# Patient Record
Sex: Female | Born: 1940 | Race: White | Hispanic: No | Marital: Married | Smoking: Never smoker
Health system: Southern US, Community
[De-identification: ages and names within clinical notes are randomized; demographics above are authoritative.]

## PROBLEM LIST (undated history)

## (undated) DIAGNOSIS — D259 Leiomyoma of uterus, unspecified: Secondary | ICD-10-CM

## (undated) DIAGNOSIS — I255 Ischemic cardiomyopathy: Secondary | ICD-10-CM

## (undated) DIAGNOSIS — E039 Hypothyroidism, unspecified: Secondary | ICD-10-CM

## (undated) DIAGNOSIS — I2510 Atherosclerotic heart disease of native coronary artery without angina pectoris: Secondary | ICD-10-CM

## (undated) DIAGNOSIS — J309 Allergic rhinitis, unspecified: Secondary | ICD-10-CM

## (undated) DIAGNOSIS — K5730 Diverticulosis of large intestine without perforation or abscess without bleeding: Secondary | ICD-10-CM

## (undated) DIAGNOSIS — I10 Essential (primary) hypertension: Secondary | ICD-10-CM

## (undated) DIAGNOSIS — E785 Hyperlipidemia, unspecified: Secondary | ICD-10-CM

## (undated) HISTORY — DX: Leiomyoma of uterus, unspecified: D25.9

## (undated) HISTORY — PX: TUBAL LIGATION: SHX77

## (undated) HISTORY — DX: Allergic rhinitis, unspecified: J30.9

## (undated) HISTORY — DX: Essential (primary) hypertension: I10

## (undated) HISTORY — DX: Diverticulosis of large intestine without perforation or abscess without bleeding: K57.30

## (undated) HISTORY — PX: CHOLECYSTECTOMY: SHX55

## (undated) HISTORY — PX: ABDOMINAL HYSTERECTOMY: SHX81

## (undated) HISTORY — DX: Hyperlipidemia, unspecified: E78.5

## (undated) HISTORY — DX: Hypothyroidism, unspecified: E03.9

---

## 1997-12-19 ENCOUNTER — Ambulatory Visit (HOSPITAL_COMMUNITY): Admission: RE | Admit: 1997-12-19 | Discharge: 1997-12-19 | Payer: Self-pay | Admitting: Obstetrics and Gynecology

## 1999-02-05 ENCOUNTER — Ambulatory Visit (HOSPITAL_COMMUNITY): Admission: RE | Admit: 1999-02-05 | Discharge: 1999-02-05 | Payer: Self-pay | Admitting: Obstetrics and Gynecology

## 1999-02-05 ENCOUNTER — Encounter: Payer: Self-pay | Admitting: Obstetrics and Gynecology

## 2000-02-07 ENCOUNTER — Encounter: Payer: Self-pay | Admitting: Obstetrics and Gynecology

## 2000-02-07 ENCOUNTER — Ambulatory Visit (HOSPITAL_COMMUNITY): Admission: RE | Admit: 2000-02-07 | Discharge: 2000-02-07 | Payer: Self-pay | Admitting: Obstetrics and Gynecology

## 2001-01-27 ENCOUNTER — Encounter: Payer: Self-pay | Admitting: Obstetrics and Gynecology

## 2001-01-27 ENCOUNTER — Ambulatory Visit (HOSPITAL_COMMUNITY): Admission: RE | Admit: 2001-01-27 | Discharge: 2001-01-27 | Payer: Self-pay | Admitting: Obstetrics and Gynecology

## 2002-02-01 ENCOUNTER — Encounter: Payer: Self-pay | Admitting: Obstetrics and Gynecology

## 2002-02-01 ENCOUNTER — Ambulatory Visit (HOSPITAL_COMMUNITY): Admission: RE | Admit: 2002-02-01 | Discharge: 2002-02-01 | Payer: Self-pay | Admitting: Obstetrics and Gynecology

## 2003-02-03 ENCOUNTER — Ambulatory Visit (HOSPITAL_COMMUNITY): Admission: RE | Admit: 2003-02-03 | Discharge: 2003-02-03 | Payer: Self-pay | Admitting: Obstetrics and Gynecology

## 2003-02-03 ENCOUNTER — Encounter: Payer: Self-pay | Admitting: Obstetrics and Gynecology

## 2004-03-08 ENCOUNTER — Ambulatory Visit (HOSPITAL_COMMUNITY): Admission: RE | Admit: 2004-03-08 | Discharge: 2004-03-08 | Payer: Self-pay | Admitting: Obstetrics and Gynecology

## 2004-03-12 ENCOUNTER — Encounter: Admission: RE | Admit: 2004-03-12 | Discharge: 2004-03-12 | Payer: Self-pay | Admitting: Obstetrics and Gynecology

## 2004-06-25 ENCOUNTER — Ambulatory Visit: Payer: Self-pay | Admitting: Internal Medicine

## 2004-10-17 ENCOUNTER — Ambulatory Visit: Payer: Self-pay | Admitting: Internal Medicine

## 2004-10-30 ENCOUNTER — Ambulatory Visit: Payer: Self-pay | Admitting: Internal Medicine

## 2004-11-20 ENCOUNTER — Encounter (HOSPITAL_COMMUNITY): Admission: RE | Admit: 2004-11-20 | Discharge: 2005-02-18 | Payer: Self-pay | Admitting: Internal Medicine

## 2005-04-04 ENCOUNTER — Ambulatory Visit (HOSPITAL_COMMUNITY): Admission: RE | Admit: 2005-04-04 | Discharge: 2005-04-04 | Payer: Self-pay | Admitting: Obstetrics and Gynecology

## 2005-07-18 ENCOUNTER — Ambulatory Visit: Payer: Self-pay | Admitting: Family Medicine

## 2005-09-15 ENCOUNTER — Ambulatory Visit: Payer: Self-pay | Admitting: Family Medicine

## 2005-09-24 ENCOUNTER — Ambulatory Visit: Payer: Self-pay | Admitting: Gastroenterology

## 2005-10-08 ENCOUNTER — Ambulatory Visit: Payer: Self-pay | Admitting: Gastroenterology

## 2006-04-07 ENCOUNTER — Ambulatory Visit (HOSPITAL_COMMUNITY): Admission: RE | Admit: 2006-04-07 | Discharge: 2006-04-07 | Payer: Self-pay | Admitting: Obstetrics and Gynecology

## 2006-07-10 ENCOUNTER — Ambulatory Visit: Payer: Self-pay | Admitting: Internal Medicine

## 2006-12-28 ENCOUNTER — Ambulatory Visit: Payer: Self-pay | Admitting: Internal Medicine

## 2007-02-26 ENCOUNTER — Ambulatory Visit: Payer: Self-pay | Admitting: Internal Medicine

## 2007-02-26 LAB — CONVERTED CEMR LAB
ALT: 52 units/L — ABNORMAL HIGH (ref 0–35)
AST: 42 units/L — ABNORMAL HIGH (ref 0–37)
Albumin: 4 g/dL (ref 3.5–5.2)
Amylase: 68 units/L (ref 27–131)
Bilirubin, Direct: 0.1 mg/dL (ref 0.0–0.3)
Total Bilirubin: 0.9 mg/dL (ref 0.3–1.2)

## 2007-03-15 ENCOUNTER — Ambulatory Visit: Payer: Self-pay | Admitting: Internal Medicine

## 2007-03-15 ENCOUNTER — Inpatient Hospital Stay (HOSPITAL_COMMUNITY): Admission: EM | Admit: 2007-03-15 | Discharge: 2007-03-20 | Payer: Self-pay | Admitting: Emergency Medicine

## 2007-03-19 ENCOUNTER — Encounter (INDEPENDENT_AMBULATORY_CARE_PROVIDER_SITE_OTHER): Payer: Self-pay | Admitting: General Surgery

## 2007-03-26 ENCOUNTER — Ambulatory Visit: Payer: Self-pay | Admitting: Gastroenterology

## 2007-07-02 ENCOUNTER — Ambulatory Visit (HOSPITAL_COMMUNITY): Admission: RE | Admit: 2007-07-02 | Discharge: 2007-07-02 | Payer: Self-pay | Admitting: Obstetrics and Gynecology

## 2007-07-14 ENCOUNTER — Ambulatory Visit: Payer: Self-pay | Admitting: Family Medicine

## 2007-08-02 ENCOUNTER — Encounter: Payer: Self-pay | Admitting: Internal Medicine

## 2007-08-02 DIAGNOSIS — E785 Hyperlipidemia, unspecified: Secondary | ICD-10-CM | POA: Insufficient documentation

## 2007-08-02 DIAGNOSIS — I10 Essential (primary) hypertension: Secondary | ICD-10-CM | POA: Insufficient documentation

## 2007-08-02 DIAGNOSIS — D259 Leiomyoma of uterus, unspecified: Secondary | ICD-10-CM | POA: Insufficient documentation

## 2007-08-02 DIAGNOSIS — E039 Hypothyroidism, unspecified: Secondary | ICD-10-CM | POA: Insufficient documentation

## 2007-08-02 DIAGNOSIS — J310 Chronic rhinitis: Secondary | ICD-10-CM | POA: Insufficient documentation

## 2007-08-02 DIAGNOSIS — K5730 Diverticulosis of large intestine without perforation or abscess without bleeding: Secondary | ICD-10-CM | POA: Insufficient documentation

## 2007-08-03 ENCOUNTER — Ambulatory Visit: Payer: Self-pay | Admitting: Internal Medicine

## 2007-08-03 DIAGNOSIS — D485 Neoplasm of uncertain behavior of skin: Secondary | ICD-10-CM | POA: Insufficient documentation

## 2007-08-18 ENCOUNTER — Ambulatory Visit: Payer: Self-pay | Admitting: Internal Medicine

## 2007-11-01 ENCOUNTER — Ambulatory Visit: Payer: Self-pay | Admitting: Internal Medicine

## 2008-03-15 LAB — HM COLONOSCOPY: HM Colonoscopy: NORMAL

## 2008-07-25 ENCOUNTER — Ambulatory Visit (HOSPITAL_COMMUNITY): Admission: RE | Admit: 2008-07-25 | Discharge: 2008-07-25 | Payer: Self-pay | Admitting: Obstetrics and Gynecology

## 2008-09-14 ENCOUNTER — Encounter: Payer: Self-pay | Admitting: Internal Medicine

## 2008-09-22 ENCOUNTER — Encounter: Payer: Self-pay | Admitting: Internal Medicine

## 2008-12-28 ENCOUNTER — Encounter: Payer: Self-pay | Admitting: Internal Medicine

## 2009-02-26 ENCOUNTER — Ambulatory Visit: Payer: Self-pay | Admitting: Internal Medicine

## 2009-05-15 ENCOUNTER — Ambulatory Visit: Payer: Self-pay | Admitting: Family Medicine

## 2009-07-30 ENCOUNTER — Ambulatory Visit (HOSPITAL_COMMUNITY): Admission: RE | Admit: 2009-07-30 | Discharge: 2009-07-30 | Payer: Self-pay | Admitting: Obstetrics and Gynecology

## 2010-06-13 ENCOUNTER — Ambulatory Visit: Payer: Self-pay | Admitting: Internal Medicine

## 2010-07-31 ENCOUNTER — Ambulatory Visit (HOSPITAL_COMMUNITY)
Admission: RE | Admit: 2010-07-31 | Discharge: 2010-07-31 | Payer: Self-pay | Source: Home / Self Care | Attending: Obstetrics and Gynecology | Admitting: Obstetrics and Gynecology

## 2010-08-04 ENCOUNTER — Encounter: Payer: Self-pay | Admitting: Internal Medicine

## 2010-08-13 NOTE — Assessment & Plan Note (Signed)
Summary: [facility] FLU SHOT/[facility]  Nurse Visit   Allergies: 1)  ! Benadryl 2)  ! * Cold Medications  Orders Added: 1)  Flu Vaccine 79yrs + MEDICARE PATIENTS [Q2039] 2)  Administration Flu vaccine - MCR [G0008]   Flu Vaccine Consent Questions     Do you have a history of severe allergic reactions to this vaccine? no    Any prior history of allergic reactions to egg and/or gelatin? no    Do you have a sensitivity to the preservative Thimersol? no    Do you have a past history of Guillan-Barre Syndrome? no    Do you currently have an acute febrile illness? no    Have you ever had a severe reaction to latex? no    Vaccine information given and explained to patient? yes    Are you currently pregnant? no    Lot Number:[id number]   Exp Date:01/11/2011   Site Given  Left Deltoid IM

## 2010-11-26 NOTE — H&P (Signed)
Veronica Barron, Veronica Barron                ACCOUNT NO.:  [id number]   MEDICAL RECORD NO.:  [id number]          PATIENT TYPE:  EMS   LOCATION:  MAJO                         FACILITY:  [facility]   PHYSICIAN:  Hollice Espy, M.D.DATE OF BIRTH:  Feb 22, 1941   DATE OF PROCEDURE:  DATE OF DISCHARGE:                    STAT - MUST CHANGE TO CORRECT WORK TYPE   CHIEF COMPLAINT:  Abdominal pain with nausea and vomiting.   HISTORY OF PRESENT ILLNESS:  The patient is a 70 year old white female  with a past medical history of hypertension and hypothyroidism who  started having abdominal pain several weeks ago.  The pain was described  as mid epigastric, nonradiating with some mild nausea.  She followed up  with her primary care physician who ran some blood work and told the  patient that he suspected this was reflux and did not think that this  was her gallbladder or pancreas.  Patient was started on a PPI.  Since  that time she says that it has helped some of her symptoms but she  continued to have some mid epigastric discomfort even since then.  Today  however, after breakfast she started having severe mid epigastric pain  with forceful nausea and vomiting with no relief.  She came into the  emergency room for further evaluation.  In the emergency room patient  had labs drawn.  She was found to have an elevated transaminase which  was AST of 278 and ALT of 330 and alk phos of 248.  Her total bilirubin  was 2.8.  Other labs of note were a lipase of 3700 and a white count of  11.5 with an 86% shift.  The patient's blood sugar was also elevated at  161.  It was highly suspicious that patient had a gallstone  pancreatitis.  She underwent an ultrasound of the abdomen which the  results are still pending.  She received the medication for her pain and  nausea as well as currently feeling better.  She complains now of some  mild nausea and some mild mid epigastric pain but otherwise is doing  better.  She  denies any headache, vision changes, dysphagia, chest pain,  palpitations, shortness of breath, wheezing, coughing, no hematuria or  dysuria, constipation or diarrhea, focal __________ , numbness, weakness  or pain.  Review of systems is otherwise negative.   PAST MEDICAL HISTORY:  1. Hypothyroidism.  2. Hypertension.   MEDICATIONS:  1. HCTZ 12.5 daily.  2. Synthroid 50 mcg p.o. daily.   ALLERGIES:  No known drug allergies.   SOCIAL HISTORY:  She denies any tobacco or drug use or heavy alcohol  use.   FAMILY HISTORY:  Noncontributory.   __________  Patient also had a previous history of hysterectomy but still has her  appendix.   PHYSICAL EXAMINATION:  VITAL SIGNS:  Temperature 97, pulse 87, blood  pressure 158/94, respirations 16, O2 sats 98% on room air.  GENERAL:  She is alert and oriented x 3, currently in no distress.  HEENT:  Head is normocephalic, atraumatic.  Her mucous membranes are  slightly dry.  She has no carotid bruit.  HEART:  Regular rate and rhythm, S1, S2.  LUNGS:  Clear to auscultation bilaterally.  ABDOMEN:  Soft with some tenderness in the mid epigastric area but no  radiation and is nondistended.  Positive bowel sounds.  EXTREMITIES:  No clubbing, cyanosis or edema.   LABORATORY DATA:  UA notes moderate bilirubin, trace leukocyte esterase  with 0-2 white cells and rare bacteria.  Her LFTs are noted for AST,  278, ALT 330, alk phos 248, total bilirubin 2.8 with a direct of 1.3.  Lipase is 3726, white count 11.5, H&H 14.8 and 43, MCV 80, platelet  count 452, 86% neutrophils.  Sodium 135, potassium  3.4, chloride 104,  bicarb 25, BUN 10, creatinine 0.8, glucose 161.   Ultrasound of the gallbladder is pending.   ASSESSMENT AND PLAN:  1. Gallstone pancreatitis.  Await ultrasound results.  Will make the      patient n.p.o., IV Protonix, pain and nausea control.  Will repeat      LFTs and lipase in the morning.  If numbers have not improved we      will  discuss with __________ , GI who has already been called by      the emergency room for possible ERCP.  2. Leukocytosis, likely secondary to #1.  Continue to follow.  3. Hypothyroidism.  Will hold her Synthroid which has a long half life      and she can tolerate several days of that.  4. Hypertension.  Hold her HCTZ.  P.r.n. IV medications if needed.      Hollice Espy, M.D.  Electronically Signed     SKK/MEDQ  D:  03/15/2007  T:  03/15/2007  Job:  [id number]   cc:   Rosalyn Gess. Norins, MD

## 2010-11-26 NOTE — Consult Note (Signed)
NAMEHUGH, GARROW NO.:  [id number]   MEDICAL RECORD NO.:  [id number]          PATIENT TYPE:  INP   LOCATION:  5735                         FACILITY:  [facility]   PHYSICIAN:  Cherylynn Ridges, M.D.    DATE OF BIRTH:  [birth year]   DATE OF CONSULTATION:  03/17/2007  DATE OF DISCHARGE:                                 CONSULTATION   REASON FOR CONSULTATION:  Biliary pancreatitis.   HISTORY OF PRESENT ILLNESS:  Veronica Barron is a 65-year female patient with  a history of an apparent gallbladder attack 12 years ago.  She was  treated successfully with Actigall without any recurrence of symptoms.  She was admitted to the hospital on March 15, 2007 by the hospitalist  with complaints of abdominal pain and nausea.  This had been occurring  for several weeks with an increased severity in frequency of symptoms.  She was found to have transaminitis with pancreatitis on her initial lab  work.  Lipase was greater than 3700.  Her white count was 11,500.  Ultrasound revealed multiple gallstones and chronic cholecystitis  without evidence of ductal dilatation or obstruction.  GI services were  consulted after admission and they recommended surgical consultation for  evaluation for cholecystectomy, noting that today her lipase is nearly  normal at 139, AST is down to 86, ALT down to 71 and white count has  normalized to 9900.   REVIEW OF SYSTEMS:  The patient is still complaining of epigastric and  right upper quadrant pain mainly with activity and palpation.  She has  some mild nausea but that has actually improved over the day.   SOCIAL HISTORY:  No alcohol.  No tobacco.  She does not work outside the  home.   PAST MEDICAL HISTORY:  1. Hypothyroidism.  2. Hypertension.   PAST SURGICAL HISTORY:  1. Hysterectomy.  2. Laparoscopic tubal ligation.   ALLERGIES:  NKDA.   HOME MEDICATIONS:  1. Hydrochlorothiazide.  2. Synthroid.  3. She is on Lovenox here for DVT  prophylaxis.   PHYSICAL EXAM:  GENERAL:  Pleasant female patient currently denying any  significant complaints except for some mild epigastric and right upper  quadrant pain.  VITAL SIGNS:  Temperature 98.6, blood pressure 132/70, pulse 74 and  regular, respirations 16.  NEURO:  The patient is alert and oriented x3 moving all extremities x4.  No focal deficits.  HEENT:  Head normocephalic.  Sclera noninjected, nonicteric.  NECK:  Supple.  No adenopathy.  CHEST:  Bilateral lung sounds clear to auscultation.  Respiratory effort  is nonlabored.  She is on room air.  CARDIAC:  S1-S2, no rubs, murmurs or gallops.  Pulses regular.  No JVD.  She has IV fluids infusing.  ABDOMEN:  Abdomen is soft, slightly obese, tender in the epigastrium and  right upper quadrant.  No guarding or rebounding.  Bowel sounds are  present.  EXTREMITIES:  Symmetrical in appearance without edema, cyanosis or  clubbing.   LABS:  Sodium 139, potassium 3.7, CO2 27, glucose 129, BUN 8, creatinine  0.64, white count 9900, hemoglobin 12.5, platelets  383,000.  AST peak  was 278, now 86, ALT peak 313, now 171.  Alkaline phosphatase peak was  248, now 175.  Total bilirubin peak was 2.8, now 1.5.  Lipase peak was  3726, now 139.   DIAGNOSTICS:  Ultrasound of the abdomen as noted in the history present  illness.   IMPRESSION:  1. Biliary pancreatitis.  2. Chronic cholecystitis with associated cholelithiasis.  3. Hypothyroidism.  4. Hypertension.   PLAN:  The patient's pancreatic enzymes as well as LFTs are trending  down towards normal.  At this point will probably proceed with  laparoscopic cholecystectomy this Friday, sooner if the surgeon and OR  suite become available.  Will order clear liquids, advance to low fat  full liquids if tolerates clears.  Additional recommendations per Dr.  Lindie Spruce.      Veronica Barron, N.P.      Cherylynn Ridges, M.D.  Electronically Signed    ALE/MEDQ  D:  03/17/2007  T:   03/17/2007  Job:  [id number]   cc:   Rosalyn Gess. Norins, MD

## 2010-11-26 NOTE — Assessment & Plan Note (Signed)
Veronica Barron [facility]                           PRIMARY CARE OFFICE NOTE   Veronica Barron, Veronica Barron                       MRN:          [id number]  DATE:12/28/2006                            DOB:          [birth year]    Veronica Barron is a 70 year old woman I usually saw at [facility]. Her  last office visit with me was in May of 2006. She subsequently to that  has seen Dr. Hetty Ely on several occasions. She is also followed by Dr.  Kyra Manges.   Patient presents today complaining of a swollen nodule at the later  aspect of her left foot. She reports this has been present for several  months. It is becoming more uncomfortable and painful for her to wear  regular shoes. She denies an injury or trauma.   Lipids, patient has had a longstanding history of hyperlipidemia with a  cholesterol in the 300+ range and LDL in 200+ range. She has been tried  on multiple statin drugs of which she is intolerant, including; Lipitor,  Crestor, Pravachol. She has tried niacin in the past but found the hot  flashes and severe headaches were intolerable. She has tried Zetia in  the past but had difficultly with myalgias and insomnia.   Hypertension, patient reports she has white coat hypertension with  elevated blood pressures at doctors office. Her blood pressures at home  usually run in the 140/70s range.   Patient is followed for hypothyroid disease by Dr. Kyra Manges, who has  her on Synthroid replacement therapy.   Health maintenance, patient reports she saw Dr. Elana Alm last March of  2008. Last mammogram was July of 2007. Last colonoscopy was March of  2007 with a normal study except for diverticulosis.   PAST MEDICAL HISTORY:   SURGICAL:  TAH secondary to fibroids with tubal ligation. No other  surgeries reported.   MEDICAL:  Patient had usual childhood diseases. She has hypertension,  hyperlipidemia, hypothyroid disease.   GYNECOLOGICAL HISTORY:  Patient is gravida  3, para 3.   HABITS:  Tobacco none, alcohol social.   FAMILY HISTORY:  Father died at age 6 of myocardial infarction. He also  had rheumatoid arthritis. Mother had kidney trouble. Family history is  positive for breast cancer in a maternal aunt. Family history negative  for colonoscopy or diabetes.   SOCIAL HISTORY:  Patient is married for 50 years. She has 3 sons, 2  grandchildren.   REVIEW OF SYSTEMS:  Patient has had no fevers, sweats, chills, or other  constitutional symptoms. No Ophthalmology complaints or problems. No  ENT, cardiovascular, respiratory problems. She has occasional heartburn  for which takes over-the-counter medications. She complains of urinary  frequency and urgency. She has tried prescription anticholinergics,  which her intolerable secondary to dryness. Patient does have some mild  osteoarthritic discomfort in her hands. No neurologic or dermatologic  complaints or problems.   Limited exam, temperature was 97.4, blood pressure 189/100, pulse was  88, weight 155.  GENERAL APPEARANCE: Well-nourished, well-groomed Caucasian woman who  looks her stated age in no acute distress.  EXTREMITIES: Patient  has 1 cm nodule at the lateral aspect of her foot  at the mid foot region which is tender. No fluctuance is noted. It is  not warm to the touch.   ASSESSMENT/PLAN:  1. Foot pain, patient with a possible bone cyst. Plan, plain foot      films. If there is no bone cyst then  I would have her return for      attempted aspiration and then follow up injection with cortisone.  2. Lipids, patient with significant hyperlipidemia by history. In      reviewing her chart she has never tried gemfibrozil. Plan,      gemfibrozil 600 mg tablets, one half tablet daily q. 3, and then 1      whole tablet daily times 3, and then 1 tablet in the morning and      one half tablet in the evening q.3., and then 1 tablet b.i.d.      Patient will need to have follow up laboratory in 6 to  8 weeks.  3. Hypertension, patient's blood pressure is very high in the office      but by her report running well at home. She is a reliable patient,      I would ask her to bring in readings from her blood pressure      machine at home, as well as possibly bringing her blood pressure      machine for checking for accuracy.  4. Hypothyroid disease, patient reports she is monitored by Dr.      Elana Alm.  5. Health maintenance, patient is current and up to date with her      gynecologist and for colorectal cancer screening.   Patient will be notified by phone with the results of her x-ray and  whether she needs to return for aspiration and injection. She will  return in 6 to 8 weeks as noted for checking on her lipids.     Veronica Gess Norins, MD  Electronically Signed    MEN/MedQ  DD: 12/28/2006  DT: 12/29/2006  Job #: [id number]   cc:   Veronica Barron

## 2010-11-26 NOTE — Op Note (Signed)
Veronica Barron, LUKIC NO.:  [id number]   MEDICAL RECORD NO.:  [id number]          PATIENT TYPE:  INP   LOCATION:  5735                         FACILITY:  [facility]   PHYSICIAN:  Cherylynn Ridges, M.D.    DATE OF BIRTH:  Nov 29, 1940   DATE OF PROCEDURE:  03/19/2007  DATE OF DISCHARGE:                               OPERATIVE REPORT   PREOPERATIVE DIAGNOSES:  Gallstone pancreatitis.   POSTOPERATIVE DIAGNOSES:  Gallstone pancreatitis.   PROCEDURE:  Laparoscopic cholecystectomy with cholangiogram.   SURGEON:  Dr. Lindie Spruce.   ANESTHESIA:  General endotracheal.   ESTIMATED BLOOD LOSS:  Less than 20 mL.   COMPLICATIONS:  None.   CONDITION:  Stable.   FINDINGS:  The patient had a very large greater than 1 cm cystic duct.  There were impacted stones in the cystic duct which we will milk back  through cholecystodochotomy. The cholangiogram showed good flow into the  duodenum, no intraductal filling defects, good proximal filling and good  flow into the duodenum.  There was evidence of acute inflammation.   INDICATIONS FOR OPERATION:  The patient is a 70 year old who was  admitted with gallstone pancreatitis who now has come to the operating  room for a laparoscopic cholecystectomy.   DESCRIPTION OF PROCEDURE:  The patient was taken to the operating room,  placed on the table in the supine position.  After an adequate general  endotracheal anesthetic was administered, she was prepped and draped in  the usual sterile manner exposing the midline in her right upper  quadrant.   A supraumbilical curvilinear incision was made using a #11 blade and  taken down to the midline fascia.  Once the midline fascia was  identified and exposed using [employer] retractors, a longitudinal  incision was made in the fascia using a #15 blade.  We grabbed the edges  of the fascia using Kocher clamps and lifted up and bluntly dissected  down into the peritoneal cavity using a Kelly clamp.  Once  this was  done, a pursestring suture of #0 Vicryl was passed around the fascial  opening to secure in a Hasson cannula which was subsequently passed.  With Hasson cannula in place, carbon dioxide gas was insufflated into  the peritoneal cavity up to maximal intra-abdominal pressure of 15 mmHg.   Once this was done, the patient was placed in reverse Trendelenburg, the  left side was tilted down, two right costal margin, a right-sided 5-mm  cannulas and a subxiphoid 12 mm cannula passed under direct vision. The  gallbladder was found to be very narrow and thin and somewhat  intrahepatic.  We retracted it towards the right upper quadrant and the  anterior abdominal wall using a retractor through the lateral-most 5-mm  cannula.  A second grasper was passed along the infundibulum.  We  dissected out the peritoneum overlying the triangle of Calot and  hepatoduodenal triangle.  When this was done, we were able to expose  what appeared to be a very large cystic duct.  We took care to identify  a window around this ductal structure and  also identify in the triangle  of Calot the cystic artery which we subsequently clipped and transected.  We were able to clearly identify that this large ductal structure was  the cystic duct.  It was too large to pass a clip along the gallbladder  side so we made a cholecystodochotomy through which multiple impacted  stones were milked free.  We were able to pass a right-angle clamp into  the duct opening and retrieve further stone structures.  We could not  place a clip along the distal side but we did place a Cook catheter  which had been passed through the anterior abdominal wall.  We secured  it in place with the Endoclip.  Once this was done, a cholangiogram was  done which demonstrated the findings noted previously.   Once the cholangiogram was completed, we removed the clip securing it in  place, removed the catheter itself and then we completely transected  the  cystic duct.  Once that was done, we used an alligator clamp to control  the end of the transected cystic duct point. We used two Endoloops to  secure the open duct and close it off.   Once this was done and no sutures were cut, we dissected out the  gallbladder from its bed with minimal difficulty.  Then we brought it  out using an EndoCatch bag from the supraumbilical site with minimal  difficulty.   We irrigated with about a liter and half of warm saline solution and  there was excellent hemostasis of the hepatic bed.  We aspirated all  fluid and gas from above the liver.  We removed the cannula from the  supraumbilical site and close off the fascia using a pursestring suture  which was in place.   Once this was completed, we removed all cannulas, aspirated all fluid  and gas and then closed.   The lateral cannula sites were closed with Dermabond.  The  supraumbilical and subxiphoid skin sites were closed using a running  subcuticular stitch of 4-0 Vicryl.  A 0.25% Marcaine with epi was  injected at all sites.  All needle counts, sponge counts and instrument  counts were correct at the conclusion of the case. Sterile dressings  were applied to all wounds.      Cherylynn Ridges, M.D.  Electronically Signed     JOW/MEDQ  D:  03/19/2007  T:  03/19/2007  Job:  [id number]   cc:   Vikki Ports A. Felicity Coyer, MD

## 2010-11-29 NOTE — Discharge Summary (Signed)
NAMEPHILOMENA, Veronica Barron                ACCOUNT NO.:  [id number]   MEDICAL RECORD NO.:  [id number]          PATIENT TYPE:  INP   LOCATION:  5735                         FACILITY:  [facility]   PHYSICIAN:  Valerie A. Felicity Coyer, MDDATE OF BIRTH:  [birth year]   DATE OF ADMISSION:  03/15/2007  DATE OF DISCHARGE:  03/20/2007                               DISCHARGE SUMMARY   This is a discharge summary the patient 53 medical record number zero,  zero.  [id number].   DISCHARGE DIAGNOSES:  1. Acute biliary pancreatitis status post laparoscopic cholecystectomy      with intraoperative cholangiogram performed on March 19, 2007.  2. Hypothyroid.  3. Hypertension.  4. Hypokalemia.   HISTORY OF PRESENT ILLNESS:  Veronica Barron is a 70 year old female who was  admitted on March 15, 2007 with chief complaint of abdominal pain and  nausea and vomiting.  She noted that her abdominal pain started several  weeks prior to her presenting in the emergency department.  She  described her pain as midepigastric and non-radiating with some mild  nausea.  She was admitted for further evaluation and treatment.   PAST MEDICAL HISTORY:  1. Hypothyroidism.  2. Hypertension.   COURSE OF HOSPITALIZATION:  1. Acute biliary pancreatitis.  The patient was admitted and was seen      in consultation by [facility].  Ultrasound noted on a contracted      gallbladder suggesting chronic cholecystitis.  They recommended a      surgical evaluation.  The patient was seen in consultation by Dr.      Lindie Spruce of surgery and was taking was taken to the operating room on      March 19, 2007 for laparoscopic cholecystectomy with      intraoperative cholangiogram which was performed by Dr. Lindie Spruce.      Postoperative course was uneventful.  The patient was discharged to      home on March 20, 2007   MEDICATIONS:  At time of discharge:  1. Hydrochlorothiazide 12.5 mg p.o. daily.  2. Synthroid 50 mcg p.o. daily.  3. Tylox one tab p.o.  q.4h. p.r.n. pain.   DISPOSITION:  The patient was discharged to home.  this is.      Sandford Craze, NP      Raenette Rover. Felicity Coyer, MD  Electronically Signed    MO/MEDQ  D:  05/06/2007  T:  05/07/2007  Job:  [id number]

## 2011-04-25 LAB — CBC
HCT: 37.4
HCT: 38.2
HCT: 38.5
Hemoglobin: 12.5
Hemoglobin: 12.8
Hemoglobin: 13.1
Hemoglobin: 13.3
Hemoglobin: 14.8
MCHC: 34.2
MCHC: 34.2
MCHC: 34.4
MCHC: 34.6
MCV: 87.4
MCV: 87.6
MCV: 87.9
Platelets: 388
Platelets: 452 — ABNORMAL HIGH
RBC: 4.28
RBC: 4.39
RDW: 14.7 — ABNORMAL HIGH
RDW: 14.9 — ABNORMAL HIGH
WBC: 11.5 — ABNORMAL HIGH
WBC: 8.7
WBC: 9.9

## 2011-04-25 LAB — URINALYSIS, ROUTINE W REFLEX MICROSCOPIC
Glucose, UA: NEGATIVE
Specific Gravity, Urine: 1.019
Urobilinogen, UA: 1
pH: 6.5

## 2011-04-25 LAB — LIPASE, BLOOD
Lipase: 139 — ABNORMAL HIGH
Lipase: 3726 — ABNORMAL HIGH
Lipase: 53
Lipase: 83 — ABNORMAL HIGH

## 2011-04-25 LAB — COMPREHENSIVE METABOLIC PANEL
AST: 149 — ABNORMAL HIGH
Alkaline Phosphatase: 175 — ABNORMAL HIGH
BUN: 8
CO2: 24
Calcium: 8.9
Creatinine, Ser: 0.48
Creatinine, Ser: 0.64
GFR calc non Af Amer: >60
GFR calc non Af Amer: >60
Glucose, Bld: 117 — ABNORMAL HIGH
Glucose, Bld: 126 — ABNORMAL HIGH
Potassium: 3 — ABNORMAL LOW
Potassium: 3.7
Total Bilirubin: 1.9 — ABNORMAL HIGH
Total Protein: 6.4
Total Protein: 6.6

## 2011-04-25 LAB — DIFFERENTIAL
Basophils Absolute: 0
Basophils Absolute: 0.1
Eosinophils Absolute: 0
Eosinophils Absolute: 0.1
Lymphocytes Relative: 12
Lymphocytes Relative: 25
Lymphs Abs: 1.4
Lymphs Abs: 2.7
Neutro Abs: 9.9 — ABNORMAL HIGH
Neutrophils Relative %: 70

## 2011-04-25 LAB — BASIC METABOLIC PANEL
GFR calc non Af Amer: >60
Glucose, Bld: 133 — ABNORMAL HIGH
Potassium: 3.1 — ABNORMAL LOW
Sodium: 138

## 2011-04-25 LAB — I-STAT 8, (EC8 V) (CONVERTED LAB)
Acid-Base Excess: 2
BUN: 10
Bicarbonate: 24.9 — ABNORMAL HIGH
HCT: 53 — ABNORMAL HIGH
Hemoglobin: 18 — ABNORMAL HIGH
Operator id: 270111
Sodium: 135

## 2011-04-25 LAB — HEPATIC FUNCTION PANEL
ALT: 129 — ABNORMAL HIGH
ALT: 330 — ABNORMAL HIGH
Bilirubin, Direct: 1.3 — ABNORMAL HIGH
Indirect Bilirubin: 0.9
Indirect Bilirubin: 1.5 — ABNORMAL HIGH
Total Protein: 6.5
Total Protein: 7.7

## 2011-07-28 ENCOUNTER — Other Ambulatory Visit: Payer: Self-pay | Admitting: Internal Medicine

## 2011-07-28 DIAGNOSIS — Z1231 Encounter for screening mammogram for malignant neoplasm of breast: Secondary | ICD-10-CM

## 2011-08-27 ENCOUNTER — Ambulatory Visit (HOSPITAL_COMMUNITY)
Admission: RE | Admit: 2011-08-27 | Discharge: 2011-08-27 | Disposition: A | Discharge status: Home / Self Care | Payer: Medicare Other | Source: Ambulatory Visit | Attending: Internal Medicine | Admitting: Internal Medicine

## 2011-08-27 DIAGNOSIS — Z1231 Encounter for screening mammogram for malignant neoplasm of breast: Secondary | ICD-10-CM | POA: Insufficient documentation

## 2011-09-11 ENCOUNTER — Ambulatory Visit (INDEPENDENT_AMBULATORY_CARE_PROVIDER_SITE_OTHER): Payer: Medicare Other | Admitting: Internal Medicine

## 2011-09-11 VITALS — BP 168/92 | HR 79 | Temp 98.6°F | Resp 22 | Ht 63.0 in | Wt 159.5 lb

## 2011-09-11 DIAGNOSIS — E785 Hyperlipidemia, unspecified: Secondary | ICD-10-CM

## 2011-09-11 DIAGNOSIS — Z0000 Encounter for general adult medical examination without abnormal findings: Secondary | ICD-10-CM

## 2011-09-11 DIAGNOSIS — I10 Essential (primary) hypertension: Secondary | ICD-10-CM

## 2011-09-11 DIAGNOSIS — R202 Paresthesia of skin: Secondary | ICD-10-CM

## 2011-09-11 DIAGNOSIS — E039 Hypothyroidism, unspecified: Secondary | ICD-10-CM

## 2011-09-11 MED ORDER — LOSARTAN POTASSIUM-HCTZ 50-12.5 MG PO TABS: 1.0000 | ORAL_TABLET | Freq: Every day | ORAL | Status: DC

## 2011-09-11 MED ORDER — SERTRALINE HCL 100 MG PO TABS: 50.0000 mg | ORAL_TABLET | Freq: Every day | ORAL | Status: DC

## 2011-09-11 NOTE — Patient Instructions (Signed)
Thanks for coming to see me.   Blood pressure - will start a new product: losartan 50 mg ( an ARB) with HCTZ 12.5 ( a diuretic). Please come back in one month for follow-up lab to monitor kidney function and for a blood pressure check.  Cholesterol - will check a level to see what can of problem we have. There are NON-STATIN drugs we can use.  Bone density - will have that scheduled for you  Paresthesia - tingling in the feet - will check a B12 level along with thyroid today.

## 2011-09-11 NOTE — Progress Notes (Signed)
Subjective:    Patient ID: Veronica Barron, female    DOB: 03/09/1941, 71 y.o.   MRN: [id number]  HPI The patient is here for annual Medicare wellness examination and management of other chronic and acute problems.   The risk factors are reflected in the social history.  The roster of all physicians providing medical care to patient - is listed in the Snapshot section of the chart.  Activities of daily living:  The patient is 100% inedpendent in all ADLs: dressing, toileting, feeding as well as independent mobility  Home safety : The patient has outdated smoke detectors in the home. Falls - discussed "fall-safety. They wear seatbelts.  firearms are present in the home, kept in a safe fashion. There is no violence in the home.   There is no risks for hepatitis, STDs or HIV. There is no history of blood transfusion. They have no travel history to infectious disease endemic areas of the world.  The patient has seen their dentist in the last six month. 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 They deny any hearing difficulty and have not had audiologic testing in the last year.  They do not  have excessive sun exposure. Discussed the need for sun protection: hats, long sleeves and use of sunscreen if there is significant sun exposure.   Diet: the importance of a healthy diet is discussed. They do have a healthy diet.  The patient has a regular exercise program: walks ,  45 min duration, 5 per week.  The benefits of regular aerobic exercise were discussed.  Depression screen: there are no signs or vegative symptoms of depression- irritability, change in appetite, anhedonia, sadness/tearfullness. Has been able to work through her grief over the loss of  Her son last year in an MVA  Cognitive assessment: the patient manages all their financial and personal affairs and is actively engaged.   The following portions of the patient's history were  reviewed and updated as appropriate: allergies, current medications, past family history, past medical history,  past surgical history, past social history  and problem list.  Vision, hearing, body mass index were assessed and reviewed.   During the course of the visit the patient was educated and counseled about appropriate screening and preventive services including : fall prevention , diabetes screening, nutrition counseling, colorectal cancer screening, and recommended immunizations.    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  HEENT:  Negative for hearing loss, ear pain, congestion, neck stiffness and postnasal drip. Negative for sore throat or swallowing problems. Negative for dental complaints.   Eyes: Negative for vision loss or change in visual acuity.  Respiratory: Negative for chest tightness and wheezing. Negative for DOE.   Cardiovascular: Negative for chest pain or palpitations.  decreased exercise tolerance Gastrointestinal: No change in bowel habit. No bloating or gas. No reflux or indigestion Genitourinary: Negative for urgency, frequency, flank pain and difficulty urinating.  Musculoskeletal: Negative for myalgias, back pain, arthralgias and gait problem.  Neurological: Negative for dizziness, tremors, weakness and headaches.  Hematological: Negative for adenopathy.    Psychiatric/Behavioral: Negative for behavioral problems and dysphoric mood.       Objective:   Physical Exam Filed Vitals:   09/11/11 1335  BP: 168/92  Pulse:   Temp:   Resp:   Weight: 159 lb 8 oz (72.349 kg)   Gen'l: well nourished, well developed white [place] in no distress HEENT - [place]/AT, EACs/TMs normal, oropharynx with native dentition in good condition, no buccal or palatal lesions, posterior pharynx clear, mucous membranes moist. C&S clear, PERRLA, low lids to the top of the pupil right worse than left Neck - supple, no thyromegaly Nodes- negative submental, cervical, supraclavicular regions Chest - no deformity, no CVAT Lungs - cleat without rales, wheezes. No increased work of breathing Breast - deferred to normal mammogram Cardiovascular - regular rate and rhythm, quiet precordium, no murmurs, rubs or gallops, 1+ radial DP and PT pulses Abdomen - BS+ x 4, no HSM, no guarding or rebound or tenderness Pelvic - deferred to age and hysterectomy Rectal - deferred  Extremities - no clubbing, cyanosis, edema or deformity.  Neuro - A&O x 3, CN II-XII normal, motor strength normal and equal, DTRs 2+ and symmetrical biceps, radial, and patellar tendons. Cerebellar - no tremor, no rigidity, fluid movement and normal gait. Derm - Head, neck, back, abdomen and extremities without suspicious lesions  Labs ordered and pending: Cmet, Lipid panel, B12        Assessment & Plan:

## 2011-09-13 LAB — HM DEXA SCAN: HM Dexa Scan: NORMAL

## 2011-09-14 DIAGNOSIS — Z0000 Encounter for general adult medical examination without abnormal findings: Secondary | ICD-10-CM | POA: Insufficient documentation

## 2011-09-14 NOTE — Assessment & Plan Note (Addendum)
Lab is pending - recommendations to follow

## 2011-09-14 NOTE — Assessment & Plan Note (Signed)
Interval medical history is unremarkable. She has not had labs since after GB surgery. She is current with mammography. Will pull paper chart for  Last colonoscopy. Immunizations: Shingels vaccine April '09; due Tetanus and pnumonia vaccine.  In summary - a nice woman who needs to work on a regular exercise program, bring her immunizations up to day and return for full lab work.

## 2011-09-14 NOTE — Assessment & Plan Note (Signed)
Labs pending with recommendations to follow. She reports that she is taking her medication.

## 2011-09-14 NOTE — Assessment & Plan Note (Signed)
BP Readings from Last 3 Encounters:  09/11/11 168/92  02/26/09 162/94  08/18/07 170/94   Poor control on present medication. Question of adherence. Labs pending  Plan- will need f/u office visit to review BP control as well as lab.

## 2011-09-30 ENCOUNTER — Other Ambulatory Visit: Payer: Self-pay

## 2011-09-30 MED ORDER — LEVOTHYROXINE SODIUM 100 MCG PO TABS: 100.0000 ug | ORAL_TABLET | Freq: Every day | ORAL | Status: DC

## 2011-10-08 ENCOUNTER — Other Ambulatory Visit: Payer: Self-pay | Admitting: *Deleted

## 2011-10-08 ENCOUNTER — Ambulatory Visit (INDEPENDENT_AMBULATORY_CARE_PROVIDER_SITE_OTHER): Payer: Medicare Other | Admitting: Internal Medicine

## 2011-10-08 ENCOUNTER — Ambulatory Visit: Payer: Medicare Other | Admitting: Internal Medicine

## 2011-10-08 ENCOUNTER — Ambulatory Visit (INDEPENDENT_AMBULATORY_CARE_PROVIDER_SITE_OTHER)
Admission: RE | Admit: 2011-10-08 | Discharge: 2011-10-08 | Disposition: A | Discharge status: Home / Self Care | Payer: Medicare Other | Source: Ambulatory Visit

## 2011-10-08 VITALS — BP 160/98 | HR 80 | Temp 98.2°F | Resp 16 | Wt 160.0 lb

## 2011-10-08 DIAGNOSIS — N959 Unspecified menopausal and perimenopausal disorder: Secondary | ICD-10-CM

## 2011-10-08 DIAGNOSIS — I10 Essential (primary) hypertension: Secondary | ICD-10-CM

## 2011-10-08 MED ORDER — SERTRALINE HCL 100 MG PO TABS: 50.0000 mg | ORAL_TABLET | Freq: Every day | ORAL | Status: DC

## 2011-10-08 MED ORDER — METOPROLOL TARTRATE 25 MG PO TABS: 25.0000 mg | ORAL_TABLET | Freq: Two times a day (BID) | ORAL | Status: DC

## 2011-10-10 ENCOUNTER — Encounter: Payer: Self-pay | Admitting: Internal Medicine

## 2011-10-10 NOTE — Assessment & Plan Note (Signed)
BP Readings from Last 3 Encounters:  10/08/11 160/98  09/11/11 168/92  02/26/09 162/94   Poor control.  Plan - add metoprolol 25 mg bid           Report back BP readings.

## 2011-10-10 NOTE — Progress Notes (Signed)
  Subjective:    Patient ID: Veronica Barron, female    DOB: 1940/11/12, 71 y.o.   MRN: [id number]  HPI Veronica Barron returns for BP management. Her blood pressure remains above goal. She has tolerated Hyzaar. A friend has recommended she try lopressor 25 mg bid. She has been asymptomatic  PMH, FamHx and SocHx reviewed for any changes and relevance.    Review of Systems System review is negative for any constitutional, cardiac, pulmonary, GI or neuro symptoms or complaints other than as described in the HPI.     Objective:   Physical Exam Filed Vitals:   10/08/11 1114  BP: 160/98  Pulse: 80  Temp: 98.2 F (36.8 C)  Resp: 16   Gen'l - WNWD nicely groomed white woman in no distress Pulm - normal respirations Neuro - A&O x 3, CN II-XII grossly normal.       Assessment & Plan:

## 2011-11-09 ENCOUNTER — Encounter: Payer: Self-pay | Admitting: Internal Medicine

## 2011-12-04 ENCOUNTER — Encounter: Payer: Self-pay | Admitting: Endocrinology

## 2011-12-04 ENCOUNTER — Ambulatory Visit (INDEPENDENT_AMBULATORY_CARE_PROVIDER_SITE_OTHER): Payer: Medicare Other | Admitting: Endocrinology

## 2011-12-04 ENCOUNTER — Other Ambulatory Visit: Payer: Self-pay

## 2011-12-04 VITALS — BP 148/92 | HR 100 | Temp 98.1°F

## 2011-12-04 DIAGNOSIS — I10 Essential (primary) hypertension: Secondary | ICD-10-CM

## 2011-12-04 MED ORDER — CEFUROXIME AXETIL 250 MG PO TABS: 250.0000 mg | ORAL_TABLET | Freq: Two times a day (BID) | ORAL | Status: DC

## 2011-12-04 MED ORDER — TRAMADOL HCL 50 MG PO TABS: 50.0000 mg | ORAL_TABLET | Freq: Three times a day (TID) | ORAL | Status: AC | PRN

## 2011-12-04 MED ORDER — LEVOTHYROXINE SODIUM 100 MCG PO TABS: 100.0000 ug | ORAL_TABLET | Freq: Every day | ORAL | Status: DC

## 2011-12-04 MED ORDER — METOPROLOL SUCCINATE ER 25 MG PO TB24: 25.0000 mg | ORAL_TABLET | Freq: Every day | ORAL | Status: DC

## 2011-12-04 MED ORDER — CEFUROXIME AXETIL 250 MG PO TABS: 250.0000 mg | ORAL_TABLET | Freq: Two times a day (BID) | ORAL | Status: AC

## 2011-12-04 NOTE — Progress Notes (Signed)
  Subjective:    Patient ID: Veronica Barron, female    DOB: [birth year], 71 y.o.   MRN: [id number]  HPI Pt states 10 days of dry-quality cough in the chest, and assoc myalgias and sore throat. Pt says she did not take the metoprolol, due to drowsiness.   No past medical history on file.  No past surgical history on file.  History   Social History  . Marital Status: Married    Spouse Name: N/A    Number of Children: N/A  . Years of Education: N/A   Occupational History  . Not on file.   Social History Main Topics  . Smoking status: Never Smoker   . Smokeless tobacco: Never Used  . Alcohol Use: Not on file  . Drug Use: Not on file  . Sexually Active: Not on file   Other Topics Concern  . Not on file   Social History Narrative  . No narrative on file    Current Outpatient Prescriptions on File Prior to Visit  Medication Sig Dispense Refill  . losartan-hydrochlorothiazide (HYZAAR) 50-12.5 MG per tablet Take 1 tablet by mouth daily.  30 tablet  3  . sertraline (ZOLOFT) 100 MG tablet Take 0.5 tablets (50 mg total) by mouth daily.  90 tablet  3  . metoprolol succinate (TOPROL-XL) 25 MG 24 hr tablet Take 1 tablet (25 mg total) by mouth daily.  30 tablet  11    Allergies  Allergen Reactions  . Diphenhydramine Hcl     REACTION: TACHYCARDIA    No family history on file.  BP 148/92  Pulse 100  Temp(Src) 98.1 F (36.7 C) (Oral)  SpO2 95%   Review of Systems She has chills, but is unaware of any fever.  No wheezing.      Objective:   Physical Exam VITAL SIGNS:  See vs page GENERAL: no distress head: no deformity eyes: no periorbital swelling, no proptosis.   external nose and ears are normal mouth: no lesion seen.   Both tm's are red. LUNGS:  Clear to auscultation.         Assessment & Plan:  URI, new HTN. therapy is limited by drug intolerance

## 2011-12-04 NOTE — Patient Instructions (Addendum)
i have sent 2 prescriptions to your pharmacy: for an antibiotic, and cough medication. I hope you feel better soon.  If you don't feel better by next week, please call back.  Please call sooner if you get worse. Try the metoprolol at 25 mg (extended-release) once daily. please see dr Debby Bud for a blood-pressure check in a few weeks.

## 2011-12-05 ENCOUNTER — Other Ambulatory Visit: Payer: Self-pay

## 2011-12-05 ENCOUNTER — Telehealth: Payer: Self-pay

## 2011-12-05 MED ORDER — METOPROLOL SUCCINATE ER 25 MG PO TB24: 25.0000 mg | ORAL_TABLET | Freq: Every day | ORAL | Status: DC

## 2011-12-05 NOTE — Telephone Encounter (Signed)
Pt called stating that [facility] pharmacy will not fill ABX because it was first sent to [facility]. Rx canceled at [facility], pt informed of same. Pt also requested ABX to a different [facility] location. Per [facility] pharmacist, pt will need to call that location and request fill. Pt advised of same.

## 2011-12-17 ENCOUNTER — Telehealth: Payer: Self-pay | Admitting: Internal Medicine

## 2011-12-17 NOTE — Telephone Encounter (Signed)
May add on 

## 2011-12-17 NOTE — Telephone Encounter (Signed)
Pt has had a cough for a month.  She would like to be worked in.

## 2011-12-18 ENCOUNTER — Ambulatory Visit (INDEPENDENT_AMBULATORY_CARE_PROVIDER_SITE_OTHER): Payer: Medicare Other | Admitting: Internal Medicine

## 2011-12-18 ENCOUNTER — Encounter: Payer: Self-pay | Admitting: Internal Medicine

## 2011-12-18 ENCOUNTER — Ambulatory Visit (INDEPENDENT_AMBULATORY_CARE_PROVIDER_SITE_OTHER)
Admission: RE | Admit: 2011-12-18 | Discharge: 2011-12-18 | Disposition: A | Discharge status: Home / Self Care | Payer: Medicare Other | Source: Ambulatory Visit | Attending: Internal Medicine | Admitting: Internal Medicine

## 2011-12-18 VITALS — BP 152/88 | HR 72 | Temp 98.8°F | Resp 16 | Ht 64.0 in | Wt 154.0 lb

## 2011-12-18 DIAGNOSIS — R059 Cough, unspecified: Secondary | ICD-10-CM

## 2011-12-18 DIAGNOSIS — R05 Cough: Secondary | ICD-10-CM

## 2011-12-18 MED ORDER — PROMETHAZINE-CODEINE 6.25-10 MG/5ML PO SYRP: 5.0000 mL | ORAL_SOLUTION | ORAL | Status: AC | PRN

## 2011-12-18 MED ORDER — BENZONATATE 100 MG PO CAPS: 100.0000 mg | ORAL_CAPSULE | Freq: Three times a day (TID) | ORAL | Status: AC

## 2011-12-18 MED ORDER — PREDNISONE 10 MG PO TABS: 10.0000 mg | ORAL_TABLET | Freq: Every day | ORAL | Status: DC

## 2011-12-18 NOTE — Patient Instructions (Signed)
Persistent cough - there is no evidence of a bacterial infection, thus no antibiotics are needed. This may be a tracheal irritation that is driving the cough.   Plan - Will get a chest x-ray today to rule out any serious cause of coughing.     take prednisone 10 mg once a day x 10 days; take a tessalon perle 3 times a day for 10 days; phenergan with codeine 1 tsp every 6 hours (if it makes  you too drowsy take robitussin during the day and this at night); take mucinex 1200 mg twice a day. Hydrate.

## 2011-12-18 NOTE — Progress Notes (Signed)
  Subjective:    Patient ID: Veronica Barron, female    DOB: May 04, 1941, 71 y.o.   MRN: [id number]  HPI Mrs. Sollers was seen May 23rd by Dr. Everardo All for URI/cough/cold. She was treated with ceftin for 10 days and tramadol for the cough. The tramadol helped the cough some but she still has a cough, congestion. She denies, fever, rigors, being SOB. Her cough is non-productive. Coughing has been keeping her up at night yet it feels like she cannot get it out of her chest. No N/V/D  For more than a week.   PMH, FamHx and SocHx reviewed for any changes and relevance.    Review of Systems System review is negative for any constitutional, cardiac, pulmonary, GI or neuro symptoms or complaints other than as described in the HPI.     Objective:   Physical Exam Filed Vitals:   12/18/11 1423  BP: 152/88  Pulse: 72  Temp: 98.8 F (37.1 C)  Resp: 16   Gen'l - WNWD white woman in no distress HEENT - normal Cor- RRR Pulm - no increased work of breathing, lungs - CTAP w/o wheezing. Rales  CXR June 6th: IMPRESSION:  1. Vague opacity in the right upper lobe near the apex as  described above. Recommend follow-up chest x-ray.  2. Mild peribronchial thickening consistent with bronchitis.  3. Small hiatal hernia.        Assessment & Plan:  1. Cough - persistent symptoms c/w cyclical cough.  Plan -  Phenergan/codiene cough syrup q 6; tessalon perles tid; prednisone 10 mg qd x 10.  2. Abnormal CXR -   Plan - CT chest in 2 weeks.

## 2011-12-18 NOTE — Telephone Encounter (Signed)
Appt at 2:00 today. °

## 2011-12-19 ENCOUNTER — Telehealth: Payer: Self-pay | Admitting: *Deleted

## 2011-12-19 ENCOUNTER — Other Ambulatory Visit: Payer: Self-pay | Admitting: Internal Medicine

## 2011-12-19 DIAGNOSIS — R9389 Abnormal findings on diagnostic imaging of other specified body structures: Secondary | ICD-10-CM

## 2011-12-19 NOTE — Telephone Encounter (Signed)
PATIENT NOTIFIED OF CHEST X-RAY RESULTS. [facility] TO SCHEDULE A CT SCAN OF THE CHEST IN 2 WEEKS. NO NEED FOR FURTHER ANTIBIOTICS

## 2011-12-19 NOTE — Telephone Encounter (Signed)
Message copied by Elnora Morrison on Fri Dec 19, 2011  2:53 PM ------      Message from: Jacques Navy      Created: Fri Dec 19, 2011  2:35 PM       Call patient: chest x-ray with resolving bronchitis without acute infection. There is a vague nodular density in the right lung high up. We will schedule you for a CT scan of the chest in 2 weeks (time to allow complete resolution of the bronchitis) to further evaluated this spot. There is no need for additional antibiotics.

## 2011-12-22 ENCOUNTER — Telehealth: Payer: Self-pay | Admitting: Internal Medicine

## 2011-12-22 DIAGNOSIS — E785 Hyperlipidemia, unspecified: Secondary | ICD-10-CM

## 2011-12-22 DIAGNOSIS — E039 Hypothyroidism, unspecified: Secondary | ICD-10-CM

## 2011-12-22 DIAGNOSIS — I10 Essential (primary) hypertension: Secondary | ICD-10-CM

## 2011-12-22 NOTE — Telephone Encounter (Signed)
Pt scheduled for CT CHEST W CONTRAST on wed June 12@1 :00 Lb ct. pt need a order to have labs  Need a bun and creatine

## 2011-12-22 NOTE — Telephone Encounter (Signed)
Please call patinet to come in for labs that are necessary prior to CT study.

## 2011-12-22 NOTE — Telephone Encounter (Signed)
Dr Debby Bud pt stated you wanted her to have this procedure in 1 weeks so we rescheduled pt for  01-01-2012 so she will still need a bun and creatine( no orders in system for current labs  ) indicating she had this , pt has been informed of her appointment

## 2011-12-23 NOTE — Telephone Encounter (Signed)
Patient notified of need for lab study prior to CT scan on 01/01/2012

## 2011-12-24 ENCOUNTER — Other Ambulatory Visit: Payer: Medicare Other

## 2011-12-26 ENCOUNTER — Other Ambulatory Visit (INDEPENDENT_AMBULATORY_CARE_PROVIDER_SITE_OTHER): Payer: Medicare Other

## 2011-12-26 DIAGNOSIS — I10 Essential (primary) hypertension: Secondary | ICD-10-CM

## 2011-12-26 DIAGNOSIS — E785 Hyperlipidemia, unspecified: Secondary | ICD-10-CM

## 2011-12-26 DIAGNOSIS — R209 Unspecified disturbances of skin sensation: Secondary | ICD-10-CM

## 2011-12-26 DIAGNOSIS — E039 Hypothyroidism, unspecified: Secondary | ICD-10-CM

## 2011-12-26 DIAGNOSIS — R202 Paresthesia of skin: Secondary | ICD-10-CM

## 2011-12-26 LAB — HEPATIC FUNCTION PANEL
Albumin: 3.7 g/dL (ref 3.5–5.2)
Total Bilirubin: 0.6 mg/dL (ref 0.3–1.2)

## 2011-12-26 LAB — COMPREHENSIVE METABOLIC PANEL
Albumin: 3.7 g/dL (ref 3.5–5.2)
Alkaline Phosphatase: 62 U/L (ref 39–117)
BUN: 13 mg/dL (ref 6–23)
CO2: 26 mEq/L (ref 19–32)
GFR: 83.41 mL/min (ref >60.00)
Glucose, Bld: 101 mg/dL — ABNORMAL HIGH (ref 70–99)
Potassium: 4.3 mEq/L (ref 3.5–5.1)
Sodium: 142 mEq/L (ref 135–145)
Total Bilirubin: 0.6 mg/dL (ref 0.3–1.2)
Total Protein: 7.2 g/dL (ref 6.0–8.3)

## 2011-12-26 LAB — TSH: TSH: 0.8 u[IU]/mL (ref 0.35–5.50)

## 2011-12-26 LAB — LIPID PANEL
HDL: 52.1 mg/dL (ref >39.00)
Triglycerides: 336 mg/dL — ABNORMAL HIGH (ref 0.0–149.0)

## 2011-12-26 LAB — T4, FREE: Free T4: 0.97 ng/dL (ref 0.60–1.60)

## 2012-01-01 ENCOUNTER — Ambulatory Visit (INDEPENDENT_AMBULATORY_CARE_PROVIDER_SITE_OTHER)
Admission: RE | Admit: 2012-01-01 | Discharge: 2012-01-01 | Disposition: A | Discharge status: Home / Self Care | Payer: Medicare Other | Source: Ambulatory Visit | Attending: Internal Medicine | Admitting: Internal Medicine

## 2012-01-01 DIAGNOSIS — R9389 Abnormal findings on diagnostic imaging of other specified body structures: Secondary | ICD-10-CM

## 2012-01-01 DIAGNOSIS — R918 Other nonspecific abnormal finding of lung field: Secondary | ICD-10-CM

## 2012-01-01 MED ORDER — IOHEXOL 300 MG/ML  SOLN
80.0000 mL | Freq: Once | INTRAMUSCULAR | Status: AC | PRN
  Administered 2012-01-01: 80 mL via INTRAVENOUS

## 2012-01-04 ENCOUNTER — Encounter: Payer: Self-pay | Admitting: Internal Medicine

## 2012-01-08 ENCOUNTER — Telehealth: Payer: Self-pay

## 2012-01-08 NOTE — Telephone Encounter (Signed)
OV this PM

## 2012-01-08 NOTE — Telephone Encounter (Signed)
Pt called stating she is still experiencing chest congestion and productive cough (thick yellow mucus). Pt is requesting MD advisement.

## 2012-01-08 NOTE — Telephone Encounter (Signed)
Patient called and transferred to scheduleir

## 2012-01-13 ENCOUNTER — Encounter: Payer: Self-pay | Admitting: Internal Medicine

## 2012-01-14 ENCOUNTER — Telehealth: Payer: Self-pay | Admitting: Internal Medicine

## 2012-01-14 ENCOUNTER — Ambulatory Visit (INDEPENDENT_AMBULATORY_CARE_PROVIDER_SITE_OTHER): Payer: Medicare Other | Admitting: Internal Medicine

## 2012-01-14 VITALS — BP 150/84 | HR 86 | Temp 97.6°F | Resp 18 | Wt 149.1 lb

## 2012-01-14 DIAGNOSIS — R05 Cough: Secondary | ICD-10-CM

## 2012-01-14 DIAGNOSIS — R059 Cough, unspecified: Secondary | ICD-10-CM

## 2012-01-14 DIAGNOSIS — R9389 Abnormal findings on diagnostic imaging of other specified body structures: Secondary | ICD-10-CM

## 2012-01-14 MED ORDER — DILTIAZEM HCL ER 180 MG PO CP24: 180.0000 mg | ORAL_CAPSULE | Freq: Every day | ORAL | Status: DC

## 2012-01-14 MED ORDER — OMEPRAZOLE 40 MG PO CPDR: 40.0000 mg | DELAYED_RELEASE_CAPSULE | Freq: Every day | ORAL | Status: DC

## 2012-01-14 MED ORDER — HYDROCHLOROTHIAZIDE 25 MG PO TABS: 25.0000 mg | ORAL_TABLET | Freq: Every day | ORAL | Status: DC

## 2012-01-14 NOTE — Telephone Encounter (Signed)
Per CY-please tell patient let's wait until after July 4th to see if anyone cancels an appt that we can work her in sooner; if no one cancels then he and I will get sooner appt and call patient with date and time. Thanks.

## 2012-01-14 NOTE — Telephone Encounter (Signed)
I spoke with Veronica Barron and she states Dr. Debby Bud specifically asked for pt to see CDY for cough. This will be a new consult. Please advise Dr. Maple Hudson thanks

## 2012-01-14 NOTE — Patient Instructions (Addendum)
Refractory cough - no evidence of persistent infection at this point. The CT scan was notable for the appearance of "bud in tree" appearance suggestive of resolved infection. Radiology recommended a follow-up CT chest in Sept '13 (order placed).  For the cough: 1. Stop losartan/HCT and substitute diltiazem ER 180 mg and HCTZ 25 mg both taken once a day. 2. Omeprazole 40 mg once in the AM before breakfast - this is to reduce acid production in case the cough is caused by silent reflux. 3. Referral to Dr. Fannie Knee, MD - a pulmonary and allergy specialist here at [facility] on the 2nd floor. You will be notified of an appointment.

## 2012-01-16 ENCOUNTER — Encounter: Payer: Self-pay | Admitting: Internal Medicine

## 2012-01-16 NOTE — Progress Notes (Signed)
  Subjective:    Patient ID: Veronica Barron, female    DOB: 1941/01/01, 71 y.o.   MRN: [id number]  HPI Veronica Barron returns for persistent cough for two months. She has been treated for infectious causes but still has a persistent cough. She denies any fever/chills, she does produce sputum that by description is not purulent, she has no limitations in her activities.  Past Medical History  Diagnosis Date  . Leiomyoma of uterus, unspecified   . Unspecified hypothyroidism   . Hypertension   . Hyperlipidemia   . Allergic rhinitis, cause unspecified   . Diverticulosis of colon    Past Surgical History  Procedure Date  . Abdominal hysterectomy   . Tubal ligation    Family History  Problem Relation Age of Onset  . Dementia Mother   . Hyperlipidemia Mother   . Hypertension Mother   . Coronary artery disease Father   . Hypertension Father   . Hyperlipidemia Father    History   Social History  . Marital Status: Married    Spouse Name: N/A    Number of Children: N/A  . Years of Education: N/A   Occupational History  . Not on file.   Social History Main Topics  . Smoking status: Never Smoker   . Smokeless tobacco: Never Used  . Alcohol Use: Not on file  . Drug Use: Not on file  . Sexually Active: Not on file   Other Topics Concern  . 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    Current Outpatient Prescriptions on File Prior to Visit  Medication Sig Dispense Refill  . losartan-hydrochlorothiazide (HYZAAR) 50-12.5 MG per tablet Take 1 tablet by mouth daily.  30 tablet  3  . metoprolol succinate (TOPROL-XL) 25 MG 24 hr tablet Take 1 tablet (25 mg total) by mouth daily.  90 tablet  3  . predniSONE (DELTASONE) 10 MG tablet Take 1 tablet (10 mg total) by mouth daily.  10 tablet  0  . sertraline (ZOLOFT) 100 MG tablet Take 0.5 tablets (50 mg total) by  mouth daily.  90 tablet  3  . diltiazem (DILACOR XR) 180 MG 24 hr capsule Take 1 capsule (180 mg total) by mouth daily.  30 capsule  11  . hydrochlorothiazide (HYDRODIURIL) 25 MG tablet Take 1 tablet (25 mg total) by mouth daily.  30 tablet  3  . omeprazole (PRILOSEC) 40 MG capsule Take 1 capsule (40 mg total) by mouth daily.  30 capsule  3      Review of Systems System review is negative for any constitutional, cardiac, pulmonary, GI or neuro symptoms or complaints other than as described in the HPI.     Objective:   Physical Exam Filed Vitals:   01/14/12 1146  BP: 150/84  Pulse: 86  Temp: 97.6 F (36.4 C)  Resp: 18   pulm - normal respirations, no increased WOB, no rales or wheeze. Cor - RRR       Assessment & Plan:  Cough - refractory cough.   Plan Treat for reflux - start PPI q AM  Stop ARB-losartan, will substitute a CCB  Refer to Dr. Maple Hudson evaluate for allergy or pulmonary cause of cough

## 2012-01-16 NOTE — Telephone Encounter (Signed)
Katie there are no openings. Please advise thanks

## 2012-01-16 NOTE — Telephone Encounter (Signed)
Please have patient come in on Thursday July 11,2013 at 11am for 1115 am consult with CY.

## 2012-01-16 NOTE — Telephone Encounter (Signed)
I spoke with debra and is aware of apt date and time. She states she will call pt and make aware

## 2012-01-22 ENCOUNTER — Institutional Professional Consult (permissible substitution): Payer: Medicare Other | Admitting: Internal Medicine

## 2012-03-02 ENCOUNTER — Ambulatory Visit (INDEPENDENT_AMBULATORY_CARE_PROVIDER_SITE_OTHER): Payer: Medicare Other | Admitting: Internal Medicine

## 2012-03-02 ENCOUNTER — Other Ambulatory Visit: Payer: Medicare Other

## 2012-03-02 ENCOUNTER — Encounter: Payer: Self-pay | Admitting: Internal Medicine

## 2012-03-02 VITALS — BP 136/78 | HR 86 | Ht 64.0 in | Wt 154.4 lb

## 2012-03-02 DIAGNOSIS — J309 Allergic rhinitis, unspecified: Secondary | ICD-10-CM

## 2012-03-02 MED ORDER — AMOXICILLIN-POT CLAVULANATE 500-125 MG PO TABS: 1.0000 | ORAL_TABLET | Freq: Two times a day (BID) | ORAL | Status: AC

## 2012-03-02 NOTE — Progress Notes (Signed)
03/[birth year]12- 28 yoF never smoker referred courtesy of Dr.Norins for complaint of cough chest congestion, new starting in May of this year. There was some postnasal drainage, green or yellow from nose and chest. No history of sinusitis. Still blowing nose frequently. Took prednisone in June and one course of Ceftin. Denies shortness of breath, fever, headache. History of mold allergy and may be seasonal rhinitis. Skin testing many years ago was positive for dust and molds but never on allergy vaccine. Denies history of asthma or pneumonia and denies reflux. Medical treatment for hypertension. No ENT surgery.  Prior to Admission medications   Medication Sig Start Date End Date Taking? Authorizing Provider  calcium carbonate (OS-CAL) 600 MG TABS Take 600 mg by mouth daily.   Yes Historical Provider, MD  Cyanocobalamin (VITAMIN B 12 PO) Take 1 capsule by mouth daily.   Yes Historical Provider, MD  diltiazem (DILACOR XR) 180 MG 24 hr capsule Take 1 capsule (180 mg total) by mouth daily. 01/14/12 01/13/13 Yes Jacques Navy, MD  hydrochlorothiazide (HYDRODIURIL) 25 MG tablet Take 1 tablet (25 mg total) by mouth daily. 01/14/12 01/13/13 Yes Jacques Navy, MD  levothyroxine (SYNTHROID, LEVOTHROID) 100 MCG tablet Take 1 tablet by mouth daily. 12/09/11  Yes Historical Provider, MD  omeprazole (PRILOSEC) 40 MG capsule Take 1 capsule (40 mg total) by mouth daily. 01/14/12 01/13/13 Yes Jacques Navy, MD  sertraline (ZOLOFT) 100 MG tablet Take 0.5 tablets (50 mg total) by mouth daily. 10/08/11  Yes Jacques Navy, MD  vitamin C (ASCORBIC ACID) 500 MG tablet Take 500 mg by mouth daily.   Yes Historical Provider, MD  amoxicillin-clavulanate (AUGMENTIN) 500-125 MG per tablet Take 1 tablet (500 mg total) by mouth 2 (two) times daily. 03/02/12 03/12/12  Waymon Budge, MD   Past Medical History  Diagnosis Date  . Leiomyoma of uterus, unspecified   . Unspecified hypothyroidism   . Hypertension   . Hyperlipidemia   .  Allergic rhinitis, cause unspecified   . Diverticulosis of colon    Past Surgical History  Procedure Date  . Abdominal hysterectomy   . Tubal ligation    Family History  Problem Relation Age of Onset  . Dementia Mother   . Hyperlipidemia Mother   . Hypertension Mother   . Coronary artery disease Father   . Hypertension Father   . Hyperlipidemia Father    History   Social History  . Marital Status: Married    Spouse Name: N/A    Number of Children: N/A  . Years of Education: N/A   Occupational History  . Not on file.   Social History Main Topics  . Smoking status: Never Smoker   . Smokeless tobacco: Never Used  . Alcohol Use: Not on file  . Drug Use: Not on file  . Sexually Active: Not on file   Other Topics Concern  . 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   ROS-see HPI Constitutional:   No-   weight loss, night sweats, fevers, chills, fatigue, lassitude. HEENT:   No-  headaches, difficulty swallowing, tooth/dental problems, +sore throat,       No-  sneezing, itching, ear ache, +nasal congestion, post nasal drip,  CV:  No-   chest pain, orthopnea, PND, swelling in lower extremities, anasarca, dizziness, palpitations Resp: No-   shortness of breath with exertion  or at rest.              No-   productive cough,  No non-productive cough,  No- coughing up of blood.              No-   change in color of mucus.  No- wheezing.   Skin: No-   rash or lesions. GI:  No-   heartburn, indigestion, abdominal pain, nausea, vomiting, diarrhea,                 change in bowel habits, loss of appetite GU: No-   dysuria, change in color of urine, no urgency or frequency.  No- flank pain. MS:  No-   joint pain or swelling.  No- decreased range of motion.  No- back pain. Neuro-     nothing unusual Psych:  No- change in mood or affect. No depression or  anxiety.  No memory loss.  OBJ- Physical Exam General- Alert, Oriented, Affect-appropriate, Distress- none acute, well-appearing Skin- rash-none, lesions- none, excoriation- none Lymphadenopathy- none Head- atraumatic            Eyes- Gross vision intact, PERRLA, conjunctivae and secretions clear            Ears- Hearing, canals-normal            Nose- + mucus, no-Septal dev,  polyps, erosion, perforation             Throat- Mallampati III , mucosa clear , drainage- none, tonsils- atrophic Neck- flexible , trachea midline, no stridor , thyroid nl, carotid no bruit Chest - symmetrical excursion , unlabored           Heart/CV- RRR , no murmur , no gallop  , no rub, nl s1 s2                           - JVD- none , edema- none, stasis changes- none, varices- none           Lung- clear to P&A, wheeze- none, cough- none , dullness-none, rub- none           Chest wall-  Abd- tender-no, distended-no, bowel sounds-present, HSM- no Br/ Gen/ Rectal- Not done, not indicated Extrem- cyanosis- none, clubbing, none, atrophy- none, strength- nl Neuro- grossly intact to observation

## 2012-03-02 NOTE — Patient Instructions (Addendum)
Order lab- Allergy profile  Sample Dymista nasal spray    1-2 puffs each nostril once daily at bedtime  Script sent for antibiotic augmentin

## 2012-03-04 LAB — ALLERGY FULL PROFILE
Alternaria Alternata: <0.1 kU/L
Bahia Grass: <0.1 kU/L
Bermuda Grass: <0.1 kU/L
Candida Albicans: <0.1 kU/L
Cat Dander: <0.1 kU/L
Curvularia lunata: <0.1 kU/L
Dog Dander: <0.1 kU/L
Fescue: <0.1 kU/L
Goldenrod: <0.1 kU/L
Helminthosporium halodes: <0.1 kU/L
Lamb's Quarters: <0.1 kU/L
Sycamore Tree: <0.1 kU/L

## 2012-03-05 NOTE — Progress Notes (Signed)
Quick Note:  Pt aware of results and next OV date and time. ______

## 2012-03-07 ENCOUNTER — Encounter: Payer: Self-pay | Admitting: Internal Medicine

## 2012-03-07 NOTE — Assessment & Plan Note (Signed)
My initial impression would be that rhinitis/sinusitis and some bronchitis on an infectious and inflammatory basis, or more likely than allergy because of the recent onset. Plan-Augmentin, sample of Dymista, allergy profile

## 2012-03-18 ENCOUNTER — Other Ambulatory Visit: Payer: Self-pay | Admitting: *Deleted

## 2012-03-18 MED ORDER — HYDROCHLOROTHIAZIDE 25 MG PO TABS: 25.0000 mg | ORAL_TABLET | Freq: Every day | ORAL | Status: DC

## 2012-03-18 MED ORDER — OMEPRAZOLE 40 MG PO CPDR: 40.0000 mg | DELAYED_RELEASE_CAPSULE | Freq: Every day | ORAL | Status: DC

## 2012-03-18 MED ORDER — DILTIAZEM HCL ER 180 MG PO CP24: 180.0000 mg | ORAL_CAPSULE | Freq: Every day | ORAL | Status: DC

## 2012-03-18 NOTE — Telephone Encounter (Signed)
Patient called reqeust her medication HCTZ, Diltiazam, omeprozole be sent to [facility]

## 2012-04-05 ENCOUNTER — Ambulatory Visit (INDEPENDENT_AMBULATORY_CARE_PROVIDER_SITE_OTHER)
Admission: RE | Admit: 2012-04-05 | Discharge: 2012-04-05 | Disposition: A | Discharge status: Home / Self Care | Payer: Medicare Other | Source: Ambulatory Visit | Attending: Internal Medicine | Admitting: Internal Medicine

## 2012-04-05 DIAGNOSIS — R9389 Abnormal findings on diagnostic imaging of other specified body structures: Secondary | ICD-10-CM

## 2012-04-06 ENCOUNTER — Encounter: Payer: Self-pay | Admitting: Internal Medicine

## 2012-04-16 ENCOUNTER — Ambulatory Visit (INDEPENDENT_AMBULATORY_CARE_PROVIDER_SITE_OTHER): Payer: Medicare Other | Admitting: Internal Medicine

## 2012-04-16 ENCOUNTER — Encounter: Payer: Self-pay | Admitting: Internal Medicine

## 2012-04-16 VITALS — BP 118/74 | HR 76 | Ht 64.0 in | Wt 154.8 lb

## 2012-04-16 DIAGNOSIS — J219 Acute bronchiolitis, unspecified: Secondary | ICD-10-CM

## 2012-04-16 DIAGNOSIS — J310 Chronic rhinitis: Secondary | ICD-10-CM

## 2012-04-16 DIAGNOSIS — J218 Acute bronchiolitis due to other specified organisms: Secondary | ICD-10-CM

## 2012-04-16 NOTE — Progress Notes (Signed)
03/02/12- 33 yoF never smoker referred courtesy of Dr.Norins for complaint of cough chest congestion, new starting in May of this year. There was some postnasal drainage, green or yellow from nose and chest. No history of sinusitis. Still blowing nose frequently. Took prednisone in June and one course of Ceftin. Denies shortness of breath, fever, headache. History of mold allergy and may be seasonal rhinitis. Skin testing many years ago was positive for dust and molds but never on allergy vaccine. Denies history of asthma or pneumonia and denies reflux. Medical treatment for hypertension. No ENT surgery.  04/16/12- 20 yoF never smoker referred courtesy of Dr.Norins for complaint of cough chest congestion, new starting in May of this year Review labs in detail with patient; states much better since last OV-abx helped. She feels much better, normal for her. Not coughing at all and no headache or sinus congestion. Augmentin helped.Dymista nasal spray was not better than saline. Allergy Profile-03/02/2012-negative. Total IgE 12.4 without significant specific elevations. CT chest 12/19/11 IMPRESSION:  1. Bibasilar infiltrates and tree in bud nodules are likely the  sequela of infection. Recommend follow-up imaging in 3 months to  ensure resolution.  2. Right apical scarring likely accounts for the nodular opacities  seen on chest radiograph. At this time there are no nodules or  masses identified with features specific for malignancy.  Original Report Authenticated By: Rosealee Albee, M.D.  CT chest 04/06/12 IMPRESSION:  1. Resolution of bibasilar tree in bud opacities, consistent with  infectious or inflammatory bronchiolitis.  2. Moderate hiatal hernia.  3. Mild hepatic steatosis.  Original Report Authenticated By: Consuello Bossier, M.D.  ROS-see HPI Constitutional:   No-   weight loss, night sweats, fevers, chills, fatigue, lassitude. HEENT:   No-  headaches, difficulty swallowing, tooth/dental  problems, +sore throat,       No-  sneezing, itching, ear ache, nasal congestion, post nasal drip,  CV:  No-   chest pain, orthopnea, PND, swelling in lower extremities, anasarca, dizziness, palpitations Resp: No-   shortness of breath with exertion or at rest.              No-   productive cough,  No non-productive cough,  No- coughing up of blood.              No-   change in color of mucus.  No- wheezing.   Skin: No-   rash or lesions. GI:  No-   heartburn, indigestion, abdominal pain, nausea, vomiting, GU:  MS:  No-   joint pain or swelling.   Neuro-     nothing unusual Psych:  No- change in mood or affect. No depression or anxiety.  No memory loss.  OBJ- Physical Exam General- Alert, Oriented, Affect-appropriate, Distress- none acute, well-appearing Skin- rash-none, lesions- none, excoriation- none Lymphadenopathy- none Head- atraumatic            Eyes- Gross vision intact, PERRLA, conjunctivae and secretions clear            Ears- Hearing, canals-normal            Nose- clear, no-Septal dev,  polyps, erosion, perforation             Throat- Mallampati III , mucosa clear , drainage- none, tonsils- atrophic Neck- flexible , trachea midline, no stridor , thyroid nl, carotid no bruit Chest - symmetrical excursion , unlabored           Heart/CV- RRR , no murmur , no gallop  ,  no rub, nl s1 s2                           - JVD- none , edema- none, stasis changes- none, varices- none           Lung- clear to P&A, wheeze- none, cough- none , dullness-none, rub- none           Chest wall-  Abd-  Br/ Gen/ Rectal- Not done, not indicated Extrem- cyanosis- none, clubbing, none, atrophy- none, strength- nl Neuro- grossly intact to observation

## 2012-04-16 NOTE — Patient Instructions (Addendum)
I'm glad you feel better. If we can help in the future please let us know.  You can ask Dr Debby Bud about the hiatal hernia

## 2012-04-25 DIAGNOSIS — J219 Acute bronchiolitis, unspecified: Secondary | ICD-10-CM | POA: Insufficient documentation

## 2012-04-25 NOTE — Assessment & Plan Note (Signed)
Nonspecific rhinitis has resolved for now.

## 2012-04-25 NOTE — Assessment & Plan Note (Signed)
Inflammatory tracheobronchitis/bronchiolitis consistent with acute infection, resolved after Augmentin.

## 2012-08-18 ENCOUNTER — Other Ambulatory Visit: Payer: Self-pay | Admitting: Internal Medicine

## 2012-08-18 DIAGNOSIS — Z1231 Encounter for screening mammogram for malignant neoplasm of breast: Secondary | ICD-10-CM

## 2012-08-31 ENCOUNTER — Ambulatory Visit (HOSPITAL_COMMUNITY)
Admission: RE | Admit: 2012-08-31 | Discharge: 2012-08-31 | Disposition: A | Discharge status: Home / Self Care | Payer: Medicare Other | Source: Ambulatory Visit | Attending: Internal Medicine | Admitting: Internal Medicine

## 2012-08-31 DIAGNOSIS — Z1231 Encounter for screening mammogram for malignant neoplasm of breast: Secondary | ICD-10-CM

## 2012-11-03 ENCOUNTER — Encounter: Payer: Medicare Other | Admitting: Internal Medicine

## 2012-11-30 ENCOUNTER — Other Ambulatory Visit: Payer: Self-pay | Admitting: *Deleted

## 2012-11-30 MED ORDER — LEVOTHYROXINE SODIUM 100 MCG PO TABS: 100.0000 ug | ORAL_TABLET | Freq: Every day | ORAL | Status: DC

## 2012-12-01 ENCOUNTER — Other Ambulatory Visit: Payer: Self-pay

## 2012-12-01 MED ORDER — SERTRALINE HCL 100 MG PO TABS: 50.0000 mg | ORAL_TABLET | Freq: Every day | ORAL | Status: DC

## 2012-12-01 NOTE — Telephone Encounter (Signed)
rx faxed to [facility] at [phone]

## 2012-12-01 NOTE — Telephone Encounter (Signed)
ok 

## 2012-12-01 NOTE — Telephone Encounter (Signed)
Pharmacy faxed a request for refill on pt's Sertraline. Please advise, thanks

## 2013-01-13 ENCOUNTER — Encounter: Payer: Medicare Other | Admitting: Internal Medicine

## 2013-01-17 ENCOUNTER — Encounter: Payer: Medicare Other | Admitting: Internal Medicine

## 2013-01-24 ENCOUNTER — Other Ambulatory Visit: Payer: Self-pay

## 2013-01-24 MED ORDER — DILTIAZEM HCL ER 180 MG PO CP24: 180.0000 mg | ORAL_CAPSULE | Freq: Every day | ORAL | Status: DC

## 2013-01-24 MED ORDER — HYDROCHLOROTHIAZIDE 25 MG PO TABS: 25.0000 mg | ORAL_TABLET | Freq: Every day | ORAL | Status: DC

## 2013-01-24 MED ORDER — OMEPRAZOLE 40 MG PO CPDR: 40.0000 mg | DELAYED_RELEASE_CAPSULE | Freq: Every day | ORAL | Status: DC

## 2013-03-09 ENCOUNTER — Ambulatory Visit (INDEPENDENT_AMBULATORY_CARE_PROVIDER_SITE_OTHER): Payer: Medicare Other | Admitting: Internal Medicine

## 2013-03-09 ENCOUNTER — Encounter: Payer: Self-pay | Admitting: Internal Medicine

## 2013-03-09 ENCOUNTER — Ambulatory Visit (INDEPENDENT_AMBULATORY_CARE_PROVIDER_SITE_OTHER): Payer: Medicare Other

## 2013-03-09 VITALS — BP 152/88 | HR 78 | Temp 97.3°F | Ht 64.0 in | Wt 159.0 lb

## 2013-03-09 DIAGNOSIS — E039 Hypothyroidism, unspecified: Secondary | ICD-10-CM

## 2013-03-09 DIAGNOSIS — E785 Hyperlipidemia, unspecified: Secondary | ICD-10-CM

## 2013-03-09 DIAGNOSIS — Z23 Encounter for immunization: Secondary | ICD-10-CM

## 2013-03-09 DIAGNOSIS — I10 Essential (primary) hypertension: Secondary | ICD-10-CM

## 2013-03-09 DIAGNOSIS — D259 Leiomyoma of uterus, unspecified: Secondary | ICD-10-CM

## 2013-03-09 DIAGNOSIS — R079 Chest pain, unspecified: Secondary | ICD-10-CM

## 2013-03-09 DIAGNOSIS — Z0000 Encounter for general adult medical examination without abnormal findings: Secondary | ICD-10-CM

## 2013-03-09 MED ORDER — LEVOTHYROXINE SODIUM 100 MCG PO TABS: 100.0000 ug | ORAL_TABLET | Freq: Every day | ORAL | Status: DC

## 2013-03-09 MED ORDER — OMEPRAZOLE 40 MG PO CPDR: 40.0000 mg | DELAYED_RELEASE_CAPSULE | Freq: Every day | ORAL | Status: DC

## 2013-03-09 MED ORDER — SERTRALINE HCL 100 MG PO TABS: 50.0000 mg | ORAL_TABLET | Freq: Every day | ORAL | Status: DC

## 2013-03-09 MED ORDER — DILTIAZEM HCL ER 180 MG PO CP24: 180.0000 mg | ORAL_CAPSULE | Freq: Every day | ORAL | Status: DC

## 2013-03-09 MED ORDER — CLORAZEPATE DIPOTASSIUM 7.5 MG PO TABS: 7.5000 mg | ORAL_TABLET | Freq: Every evening | ORAL | Status: DC | PRN

## 2013-03-09 MED ORDER — HYDROCHLOROTHIAZIDE 25 MG PO TABS: 25.0000 mg | ORAL_TABLET | Freq: Every day | ORAL | Status: DC

## 2013-03-09 NOTE — Progress Notes (Signed)
Tranxene-t 7.5 mg called to [facility] pharmacy

## 2013-03-09 NOTE — Progress Notes (Signed)
Subjective:    Patient ID: Veronica Barron, female    DOB: March 19, 1941, 72 y.o.   MRN: [id number]  HPI The patient is here for annual Medicare wellness examination and management of other chronic and acute problems.  She has had a good year w/o major medical illness. She does report that she will have substernal discomfort with exertion. Pain resolves with rest. She has several risk factors: post-menopausal, HTN. Lipids, age.    The risk factors are reflected in the social history.  The roster of all physicians providing medical care to patient - is listed in the Snapshot section of the chart.  Activities of daily living:  The patient is 100% inedpendent in all ADLs: dressing, toileting, feeding as well as independent mobility  Home safety : The patient has smoke detectors in the home. They wear seatbelts. There is no violence in the home.   There is no risks for hepatitis, STDs or HIV. There is no   history of blood transfusion. They have no travel history to infectious disease endemic areas of the world.  The patient has seen their dentist in the last six month. They have seen their eye doctor in the last year. They deny any hearing difficulty and have not had audiologic testing in the last year.    They do not  have excessive sun exposure. Discussed the need for sun protection: hats, long sleeves and use of sunscreen if there is significant sun exposure.   Diet: the importance of a healthy diet is discussed. They do have a healthy diet.  The patient has a regular exercise program: walks , 20-30 duration, 5 per week.  The benefits of regular aerobic exercise were discussed.  Depression screen: there are no signs or vegative symptoms of depression- irritability, change in appetite, anhedonia, sadness/tearfullness.  Cognitive assessment: the patient manages all their financial and personal affairs and is actively engaged. They could relate day,date,year and events; recalled 3/3 objects at  3 minutes; performed clock-face test normally.  The following portions of the patient's history were reviewed and updated as appropriate: allergies, current medications, past family history, past medical history,  past surgical history, past social history  and problem list.  Past Medical History  Diagnosis Date  . Leiomyoma of uterus, unspecified   . Unspecified hypothyroidism   . Hypertension   . Hyperlipidemia   . Allergic rhinitis, cause unspecified   . Diverticulosis of colon    Past Surgical History  Procedure Laterality Date  . Abdominal hysterectomy    . Tubal ligation     Family History  Problem Relation Age of Onset  . Dementia Mother   . Hyperlipidemia Mother   . Hypertension Mother   . Coronary artery disease Father   . Hypertension Father   . Hyperlipidemia Father    History   Social History  . Marital Status: Married    Spouse Name: N/A    Number of Children: N/A  . Years of Education: N/A   Occupational History  . Not on file.   Social History Main Topics  . Smoking status: Never Smoker   . Smokeless tobacco: Never Used  . Alcohol Use: Yes     Comment: wine  . Drug Use: No  . Sexual Activity: Not on file   Other Topics Concern  . 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    Current Outpatient Prescriptions on File Prior to Visit  Medication Sig Dispense Refill  . calcium carbonate (OS-CAL) 600 MG TABS Take 600 mg by mouth daily.      . Cyanocobalamin (VITAMIN B 12 PO) Take 1 capsule by mouth daily.      . vitamin C (ASCORBIC ACID) 500 MG tablet Take 500 mg by mouth daily.       No current facility-administered medications on file prior to visit.     Vision, hearing, body mass index were assessed and reviewed.   During the course of the visit the patient was educated and counseled about appropriate screening and  preventive services including : fall prevention , diabetes screening, nutrition counseling, colorectal cancer screening, and recommended immunizations.    Review of Systems Constitutional:  Negative for fever, chills, activity change and unexpected weight change.  HEENT:  Negative for hearing loss, ear pain, congestion, neck stiffness and postnasal drip. Negative for sore throat or swallowing problems. Negative for dental complaints.   Eyes: Negative for vision loss or change in visual acuity.  Respiratory: Negative for chest tightness and wheezing. Negative for DOE.   Cardiovascular: positive for chest pain with exertion, no palpitations. decreased exercise tolerance limited by chest discomfort. Gastrointestinal: No change in bowel habit. No bloating or gas. No reflux or indigestion Genitourinary: Negative for urgency, frequency, flank pain and difficulty urinating.  Musculoskeletal: Negative for myalgias, back pain, arthralgias and gait problem.  Neurological: Negative for dizziness, tremors, weakness and headaches.  Hematological: Negative for adenopathy.  Psychiatric/Behavioral: Negative for behavioral problems and dysphoric mood.        Objective:   Physical Exam Filed Vitals:   03/09/13 1331  BP: 152/88  Pulse: 78  Temp: 97.3 F (36.3 C)   Wt Readings from Last 3 Encounters:  03/09/13 159 lb (72.122 kg)  04/16/12 154 lb 12.8 oz (70.217 kg)  03/02/12 154 lb 6.4 oz (70.035 kg)   Gen'l: well nourished, well developed Woman in no distress HEENT - [place]/AT, EACs/TMs normal, oropharynx with native dentition in good condition, no buccal or palatal lesions, posterior pharynx clear, mucous membranes moist. C&S clear, PERRLA, fundi - normal Neck - supple, no thyromegaly Nodes- negative submental, cervical, supraclavicular regions Chest - no deformity, no CVAT Lungs - clea without rales, wheezes. No increased work of breathing Breast - deferred Cardiovascular - regular rate and  rhythm, quiet precordium, no murmurs, rubs or gallops, 2+ radial, DP and PT pulses Abdomen - BS+ x 4, no HSM, no guarding or rebound or tenderness Pelvic - deferred to gyn Rectal - deferred to gyn Extremities - no clubbing, cyanosis, edema or deformity.  Neuro - A&O x 3, CN II-XII normal, motor strength normal and equal, DTRs 2+ and symmetrical biceps, radial, and patellar tendons. Cerebellar - no tremor, no rigidity, fluid movement and normal gait. 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  Recent Results (from the past 2160 hour(s))  HEPATIC FUNCTION PANEL     Status: Abnormal   Collection Time    03/09/13  3:41 PM      Result Value Range   Total Bilirubin 0.7  0.3 - 1.2 mg/dL   Bilirubin, Direct 0.1  0.0 - 0.3 mg/dL   Alkaline Phosphatase 61  39 - 117 U/L   AST 41 (*) 0 - 37 U/L   ALT 38 (*) 0 -  35 U/L   Total Protein 7.4  6.0 - 8.3 g/dL   Albumin 4.1  3.5 - 5.2 g/dL  TSH     Status: None   Collection Time    03/09/13  3:41 PM      Result Value Range   TSH 1.95  0.35 - 5.50 uIU/mL  COMPREHENSIVE METABOLIC PANEL     Status: Abnormal   Collection Time    03/09/13  3:41 PM      Result Value Range   Sodium 133 (*) 135 - 145 mEq/L   Potassium 3.7  3.5 - 5.1 mEq/L   Chloride 97  96 - 112 mEq/L   CO2 28  19 - 32 mEq/L   Glucose, Bld 85  70 - 99 mg/dL   BUN 9  6 - 23 mg/dL   Creatinine, Ser 0.7  0.4 - 1.2 mg/dL   Total Bilirubin 0.7  0.3 - 1.2 mg/dL   Alkaline Phosphatase 61  39 - 117 U/L   AST 41 (*) 0 - 37 U/L   ALT 38 (*) 0 - 35 U/L   Total Protein 7.4  6.0 - 8.3 g/dL   Albumin 4.1  3.5 - 5.2 g/dL   Calcium 9.1  8.4 - 16.1 mg/dL   GFR 09.60  >45.40 mL/min  LIPID PANEL     Status: Abnormal   Collection Time    03/09/13  3:41 PM      Result Value Range   Cholesterol 237 (*) 0 - 200 mg/dL   Comment: ATP III Classification       Desirable:  < 200 mg/dL                Borderline High:  200 - 239 mg/dL          High:  > = 981 mg/dL   Triglycerides   (*) 0.0 - 149.0 mg/dL   Value: 191.4 Triglyceride is over 400; calculations on Lipids are invalid.   Comment: Normal:  <150 mg/dLBorderline High:  150 - 199 mg/dL   HDL 78.29  >56.21 mg/dL   VLDL 30.8 (*) 0.0 - 65.7 mg/dL   Total CHOL/HDL Ratio 5     Comment:                Men          Women1/2 Average Risk     3.4          3.3Average Risk          5.0          4.42X Average Risk          9.6          7.13X Average Risk          15.0          11.0                      T4, FREE     Status: None   Collection Time    03/09/13  3:41 PM      Result Value Range   Free T4 0.81  0.60 - 1.60 ng/dL  LDL CHOLESTEROL, DIRECT     Status: None   Collection Time    03/09/13  3:41 PM      Result Value Range   Direct LDL 140.3     Comment: Optimal:  <100 mg/dLNear or Above Optimal:  100-129 mg/dLBorderline High:  130-159 mg/dLHigh:  160-189 mg/dLVery  High:  >190 mg/dL          Assessment & Plan:

## 2013-03-09 NOTE — Patient Instructions (Addendum)
Thanks for coming to see me.  Your exam is normal.  Your EKG reveals that you may have had a heart injury at some point. You do have symptoms of exertional chest discomfort in a setting of several cardiac risk factors: post-menopausal, hypertension, cholesterol elevation. There is a real concern for possible coronary artery disease.  Plan - Take 81 mg aspirin every day  Reduce your activity until we can complete a heart test.  You are schedule to have a nuclear cardiac stress test Tuesday, July 2nd at [facility] [address]. Please arrive at 0915 in  Clothes you can exercise in.  Take a sublingual nitroglycerine for any chest pain at rest and then go to [facility]  We will chec lab today - cholesterol and chemistries and thyroid.

## 2013-03-10 ENCOUNTER — Telehealth: Payer: Self-pay | Admitting: Internal Medicine

## 2013-03-10 LAB — COMPREHENSIVE METABOLIC PANEL
ALT: 38 U/L — ABNORMAL HIGH (ref 0–35)
Albumin: 4.1 g/dL (ref 3.5–5.2)
CO2: 28 mEq/L (ref 19–32)
GFR: 93.38 mL/min (ref >60.00)
Potassium: 3.7 mEq/L (ref 3.5–5.1)
Sodium: 133 mEq/L — ABNORMAL LOW (ref 135–145)
Total Bilirubin: 0.7 mg/dL (ref 0.3–1.2)
Total Protein: 7.4 g/dL (ref 6.0–8.3)

## 2013-03-10 LAB — HEPATIC FUNCTION PANEL
ALT: 38 U/L — ABNORMAL HIGH (ref 0–35)
Total Bilirubin: 0.7 mg/dL (ref 0.3–1.2)

## 2013-03-10 LAB — LIPID PANEL
Total CHOL/HDL Ratio: 5
VLDL: 85 mg/dL — ABNORMAL HIGH (ref 0.0–40.0)

## 2013-03-10 LAB — TSH: TSH: 1.95 u[IU]/mL (ref 0.35–5.50)

## 2013-03-10 NOTE — Telephone Encounter (Signed)
Spoke with pt and she has decided to wait before doing gynecology referral. York Spaniel she is gong to wait until after she is done have test done. Informed pt to call office back is decided to do gyn referral.

## 2013-03-11 DIAGNOSIS — R079 Chest pain, unspecified: Secondary | ICD-10-CM | POA: Insufficient documentation

## 2013-03-11 LAB — LDL CHOLESTEROL, DIRECT: Direct LDL: 140.3 mg/dL

## 2013-03-11 MED ORDER — NITROGLYCERIN 0.4 MG SL SUBL: 0.4000 mg | SUBLINGUAL_TABLET | SUBLINGUAL | Status: DC | PRN

## 2013-03-11 NOTE — Assessment & Plan Note (Signed)
Reported by patient as "Hiatal hernia pain" at exam March 09, 2013. Risk factors: post menopausal, HTN, Lipid elevation, age, question of old injury on EKG. She has had no pain at rest, no pain that will awaken her from sleep.  Plan ASA 81 mg daily  Sl nitro prn  Continue present medications  Scheduled for Myoview Tuesday, Sept 2nd - patient aware  For increased c/p she is to go to [facility].

## 2013-03-11 NOTE — Assessment & Plan Note (Signed)
LDL is above goal at 140. Cardiac risk is an issue - currently with exertional angina and old injury on EKG  Plan Will discuss treatment options after cardiac w/u. Welchol may be a good option vs low dose crestor.

## 2013-03-11 NOTE — Assessment & Plan Note (Signed)
Interval history remarkable for exertional chest pain. Physical exam is normal. Lab results reveal poor control of cholesterol but otherwise normal. She will need colorectal cancer screening. She is current with mammography. Immunizations - due for pneumonia vaccine. Work up for exertional again as above.  In summary   a very nice woman who is being evaluated for exertional angina. Health maintenance gaps are being addressed.

## 2013-03-11 NOTE — Assessment & Plan Note (Signed)
Lab Results  Component Value Date   TSH 1.95 03/09/2013   Good control on present medication.

## 2013-03-11 NOTE — Assessment & Plan Note (Signed)
BP Readings from Last 3 Encounters:  03/09/13 152/88  04/16/12 118/74  03/02/12 136/78   Generally controlled but elevated today.  Plan Continue present medications  Monitor BP at home and at repeat visit: for SBP persistently 150+ will change meds

## 2013-03-15 ENCOUNTER — Ambulatory Visit (INDEPENDENT_AMBULATORY_CARE_PROVIDER_SITE_OTHER): Payer: Medicare Other | Admitting: Cardiology

## 2013-03-15 ENCOUNTER — Inpatient Hospital Stay (HOSPITAL_COMMUNITY)
Admission: AD | Admit: 2013-03-15 | Discharge: 2013-03-17 | DRG: 247 | Disposition: A | Discharge status: Home / Self Care | Payer: Medicare Other | Source: Ambulatory Visit | Attending: Cardiology | Admitting: Cardiology

## 2013-03-15 ENCOUNTER — Encounter: Payer: Self-pay | Admitting: Internal Medicine

## 2013-03-15 ENCOUNTER — Encounter (HOSPITAL_COMMUNITY): Payer: Self-pay

## 2013-03-15 ENCOUNTER — Ambulatory Visit (HOSPITAL_BASED_OUTPATIENT_CLINIC_OR_DEPARTMENT_OTHER): Payer: Medicare Other | Admitting: Radiology

## 2013-03-15 ENCOUNTER — Encounter: Payer: Self-pay | Admitting: Cardiology

## 2013-03-15 VITALS — BP 162/86 | HR 74 | Ht 64.0 in | Wt 156.0 lb

## 2013-03-15 VITALS — BP 147/79 | HR 85 | Ht 64.0 in | Wt 158.0 lb

## 2013-03-15 DIAGNOSIS — I2510 Atherosclerotic heart disease of native coronary artery without angina pectoris: Principal | ICD-10-CM | POA: Diagnosis present

## 2013-03-15 DIAGNOSIS — I2589 Other forms of chronic ischemic heart disease: Secondary | ICD-10-CM | POA: Diagnosis present

## 2013-03-15 DIAGNOSIS — Z955 Presence of coronary angioplasty implant and graft: Secondary | ICD-10-CM

## 2013-03-15 DIAGNOSIS — E785 Hyperlipidemia, unspecified: Secondary | ICD-10-CM | POA: Diagnosis present

## 2013-03-15 DIAGNOSIS — H3500 Unspecified background retinopathy: Secondary | ICD-10-CM | POA: Diagnosis present

## 2013-03-15 DIAGNOSIS — Z7982 Long term (current) use of aspirin: Secondary | ICD-10-CM

## 2013-03-15 DIAGNOSIS — Z7902 Long term (current) use of antithrombotics/antiplatelets: Secondary | ICD-10-CM

## 2013-03-15 DIAGNOSIS — Z79899 Other long term (current) drug therapy: Secondary | ICD-10-CM

## 2013-03-15 DIAGNOSIS — R9431 Abnormal electrocardiogram [ECG] [EKG]: Secondary | ICD-10-CM

## 2013-03-15 DIAGNOSIS — I2582 Chronic total occlusion of coronary artery: Secondary | ICD-10-CM | POA: Diagnosis present

## 2013-03-15 DIAGNOSIS — Z9851 Tubal ligation status: Secondary | ICD-10-CM

## 2013-03-15 DIAGNOSIS — I10 Essential (primary) hypertension: Secondary | ICD-10-CM

## 2013-03-15 DIAGNOSIS — Z9089 Acquired absence of other organs: Secondary | ICD-10-CM

## 2013-03-15 DIAGNOSIS — E039 Hypothyroidism, unspecified: Secondary | ICD-10-CM | POA: Diagnosis present

## 2013-03-15 DIAGNOSIS — K5730 Diverticulosis of large intestine without perforation or abscess without bleeding: Secondary | ICD-10-CM | POA: Diagnosis present

## 2013-03-15 DIAGNOSIS — Z8249 Family history of ischemic heart disease and other diseases of the circulatory system: Secondary | ICD-10-CM

## 2013-03-15 DIAGNOSIS — F411 Generalized anxiety disorder: Secondary | ICD-10-CM | POA: Diagnosis present

## 2013-03-15 DIAGNOSIS — I200 Unstable angina: Secondary | ICD-10-CM

## 2013-03-15 DIAGNOSIS — R079 Chest pain, unspecified: Secondary | ICD-10-CM

## 2013-03-15 HISTORY — DX: Atherosclerotic heart disease of native coronary artery without angina pectoris: I25.10

## 2013-03-15 HISTORY — DX: Ischemic cardiomyopathy: I25.5

## 2013-03-15 LAB — CBC WITH DIFFERENTIAL/PLATELET
Basophils Absolute: 0.1 10*3/uL (ref 0.0–0.1)
Basophils Relative: 1 % (ref 0–1)
Eosinophils Absolute: 0.4 10*3/uL (ref 0.0–0.7)
Eosinophils Relative: 3 % (ref 0–5)
HCT: 44 % (ref 36.0–46.0)
Hemoglobin: 15.6 g/dL — ABNORMAL HIGH (ref 12.0–15.0)
Lymphocytes Relative: 32 % (ref 12–46)
Lymphs Abs: 3.6 10*3/uL (ref 0.7–4.0)
MCH: 30.2 pg (ref 26.0–34.0)
MCHC: 35.5 g/dL (ref 30.0–36.0)
MCV: 85.1 fL (ref 78.0–100.0)
Monocytes Absolute: 0.5 10*3/uL (ref 0.1–1.0)
Monocytes Relative: 5 % (ref 3–12)
Neutro Abs: 6.7 10*3/uL (ref 1.7–7.7)
Neutrophils Relative %: 60 % (ref 43–77)
Platelets: 345 10*3/uL (ref 150–400)
RBC: 5.17 MIL/uL — ABNORMAL HIGH (ref 3.87–5.11)
RDW: 13.7 % (ref 11.5–15.5)
WBC: 11.3 10*3/uL — ABNORMAL HIGH (ref 4.0–10.5)

## 2013-03-15 LAB — TROPONIN I: Troponin I: <0.3 ng/mL (ref <0.30)

## 2013-03-15 LAB — TSH: TSH: 2.208 u[IU]/mL (ref 0.350–4.500)

## 2013-03-15 MED ORDER — SERTRALINE HCL 50 MG PO TABS
50.0000 mg | ORAL_TABLET | Freq: Every day | ORAL | Status: DC
  Administered 2013-03-16 – 2013-03-17 (×2): 50 mg via ORAL
  Filled 2013-03-15 (×2): qty 1

## 2013-03-15 MED ORDER — PRAVASTATIN SODIUM 40 MG PO TABS
40.0000 mg | ORAL_TABLET | Freq: Every day | ORAL | Status: DC
  Administered 2013-03-15 – 2013-03-16 (×2): 40 mg via ORAL
  Filled 2013-03-15 (×4): qty 1

## 2013-03-15 MED ORDER — ONDANSETRON HCL 4 MG/2ML IJ SOLN: 4.0000 mg | Freq: Four times a day (QID) | INTRAMUSCULAR | Status: DC | PRN

## 2013-03-15 MED ORDER — PANTOPRAZOLE SODIUM 40 MG PO TBEC
40.0000 mg | DELAYED_RELEASE_TABLET | Freq: Every day | ORAL | Status: DC
  Administered 2013-03-16 – 2013-03-17 (×2): 40 mg via ORAL
  Filled 2013-03-15 (×2): qty 1

## 2013-03-15 MED ORDER — LOPERAMIDE HCL 1 MG/5ML PO LIQD
2.0000 mg | ORAL | Status: DC | PRN
  Administered 2013-03-15: 2 mg via ORAL
  Filled 2013-03-15: qty 10

## 2013-03-15 MED ORDER — CARVEDILOL 6.25 MG PO TABS
6.2500 mg | ORAL_TABLET | Freq: Two times a day (BID) | ORAL | Status: DC
  Administered 2013-03-15 – 2013-03-17 (×4): 6.25 mg via ORAL
  Filled 2013-03-15 (×7): qty 1

## 2013-03-15 MED ORDER — SODIUM CHLORIDE 0.9 % IV SOLN: 1.0000 mL/kg/h | INTRAVENOUS | Status: DC

## 2013-03-15 MED ORDER — LEVOTHYROXINE SODIUM 100 MCG PO TABS
100.0000 ug | ORAL_TABLET | Freq: Every day | ORAL | Status: DC
  Administered 2013-03-16 – 2013-03-17 (×2): 100 ug via ORAL
  Filled 2013-03-15 (×4): qty 1

## 2013-03-15 MED ORDER — NON FORMULARY: 40.0000 mg | Freq: Every day | Status: DC

## 2013-03-15 MED ORDER — SODIUM CHLORIDE 0.9 % IV SOLN: 250.0000 mL | INTRAVENOUS | Status: DC | PRN

## 2013-03-15 MED ORDER — SODIUM CHLORIDE 0.9 % IJ SOLN
3.0000 mL | Freq: Two times a day (BID) | INTRAMUSCULAR | Status: DC
  Administered 2013-03-15: 3 mL via INTRAVENOUS

## 2013-03-15 MED ORDER — NITROGLYCERIN 0.4 MG SL SUBL
0.4000 mg | SUBLINGUAL_TABLET | SUBLINGUAL | Status: DC | PRN
Start: 2013-03-15 — End: 2013-03-17

## 2013-03-15 MED ORDER — SODIUM CHLORIDE 0.9 % IJ SOLN: 3.0000 mL | INTRAMUSCULAR | Status: DC | PRN

## 2013-03-15 MED ORDER — ACETAMINOPHEN 325 MG PO TABS: 650.0000 mg | ORAL_TABLET | ORAL | Status: DC | PRN

## 2013-03-15 MED ORDER — HEPARIN SODIUM (PORCINE) 5000 UNIT/ML IJ SOLN: 5000.0000 [IU] | Freq: Three times a day (TID) | INTRAMUSCULAR | Status: DC

## 2013-03-15 MED ORDER — TECHNETIUM TC 99M SESTAMIBI GENERIC - CARDIOLITE
33.0000 | Freq: Once | INTRAVENOUS | Status: AC | PRN
  Administered 2013-03-15: 33 via INTRAVENOUS

## 2013-03-15 MED ORDER — ASPIRIN EC 81 MG PO TBEC
81.0000 mg | DELAYED_RELEASE_TABLET | Freq: Every day | ORAL | Status: DC
  Administered 2013-03-17: 10:00:00 81 mg via ORAL
  Filled 2013-03-15 (×2): qty 1

## 2013-03-15 MED ORDER — HEPARIN BOLUS VIA INFUSION
3500.0000 [IU] | Freq: Once | INTRAVENOUS | Status: AC
  Administered 2013-03-15: 3500 [IU] via INTRAVENOUS
  Filled 2013-03-15: qty 3500

## 2013-03-15 MED ORDER — NITROGLYCERIN 0.4 MG SL SUBL: 0.4000 mg | SUBLINGUAL_TABLET | SUBLINGUAL | Status: DC | PRN

## 2013-03-15 MED ORDER — VITAMIN C 500 MG PO TABS
500.0000 mg | ORAL_TABLET | Freq: Every day | ORAL | Status: DC
  Administered 2013-03-16 – 2013-03-17 (×2): 500 mg via ORAL
  Filled 2013-03-15 (×2): qty 1

## 2013-03-15 MED ORDER — CLORAZEPATE DIPOTASSIUM 3.75 MG PO TABS
7.5000 mg | ORAL_TABLET | Freq: Every evening | ORAL | Status: DC | PRN
  Administered 2013-03-15: 7.5 mg via ORAL
  Filled 2013-03-15: qty 2

## 2013-03-15 MED ORDER — SODIUM CHLORIDE 0.9 % IJ SOLN
3.0000 mL | Freq: Two times a day (BID) | INTRAMUSCULAR | Status: DC
  Administered 2013-03-15 – 2013-03-17 (×2): 3 mL via INTRAVENOUS

## 2013-03-15 MED ORDER — TECHNETIUM TC 99M SESTAMIBI GENERIC - CARDIOLITE
11.0000 | Freq: Once | INTRAVENOUS | Status: AC | PRN
  Administered 2013-03-15: 11 via INTRAVENOUS

## 2013-03-15 MED ORDER — LISINOPRIL 2.5 MG PO TABS
2.5000 mg | ORAL_TABLET | Freq: Every day | ORAL | Status: DC
  Administered 2013-03-15 – 2013-03-16 (×2): 2.5 mg via ORAL
  Filled 2013-03-15 (×3): qty 1

## 2013-03-15 MED ORDER — CALCIUM CARBONATE 1250 (500 CA) MG PO TABS
1.0000 | ORAL_TABLET | Freq: Every day | ORAL | Status: DC
  Administered 2013-03-17: 500 mg via ORAL
  Filled 2013-03-15 (×3): qty 1

## 2013-03-15 MED ORDER — HEPARIN (PORCINE) IN NACL 100-0.45 UNIT/ML-% IJ SOLN
1000.0000 [IU]/h | INTRAMUSCULAR | Status: DC
  Administered 2013-03-15: 1000 [IU]/h via INTRAVENOUS
  Filled 2013-03-15 (×2): qty 250

## 2013-03-15 MED ORDER — CALCIUM CARBONATE 600 MG PO TABS
600.0000 mg | ORAL_TABLET | Freq: Every day | ORAL | Status: DC
  Filled 2013-03-15: qty 1

## 2013-03-15 MED ORDER — OCUVITE-LUTEIN PO CAPS
1.0000 | ORAL_CAPSULE | Freq: Every day | ORAL | Status: DC
  Administered 2013-03-16 – 2013-03-17 (×2): 1 via ORAL
  Filled 2013-03-15 (×2): qty 1

## 2013-03-15 MED ORDER — ASPIRIN 81 MG PO CHEW
324.0000 mg | CHEWABLE_TABLET | ORAL | Status: AC
  Administered 2013-03-16: 324 mg via ORAL
  Filled 2013-03-15: qty 4

## 2013-03-15 NOTE — Assessment & Plan Note (Signed)
Patient has not tolerated Crestor or Lipitor previously. We will try Pravachol 40 mg daily to see she tolerates. Check lipids, liver and CK in 4 weeks.

## 2013-03-15 NOTE — Progress Notes (Signed)
ANTICOAGULATION CONSULT NOTE - Follow-Up Consult  Pharmacy Consult for Heparin Indication: chest pain/ACS  Allergies  Allergen Reactions  . Diphenhydramine Hcl     REACTION: TACHYCARDIA    Patient Measurements: Height: 5\' 4"  (162.6 cm) Weight: 157 lb 13.6 oz (71.6 kg) IBW/kg (Calculated) : 54.7  Vital Signs: Temp: 97.5 F (36.4 C) (09/02 2100) Temp src: Oral (09/02 2100) BP: 150/89 mmHg (09/02 2100) Pulse Rate: 83 (09/02 2100)  Labs:  Recent Labs  03/15/13 1825 03/15/13 2219  HGB 15.6*  --   HCT 44.0  --   PLT 345  --   HEPARINUNFRC  --  0.35  CREATININE 0.71  --   TROPONINI <0.30  --     Estimated Creatinine Clearance: 61.7 ml/min (by C-G formula based on Cr of 0.71).   Assessment: 72 year old admitted with ACS. Heparin level 0.35 (therapeutic) on 1000 units/hr. No bleeding noted. Noted plans for cath in a.m.  Goal of Therapy:  Heparin level 0.3-0.7 units/ml Monitor platelets by anticoagulation protocol: Yes   Plan:  1) Continue heparin drip at 1000 units/hr 2) F/u daily heparin level and CBC  Thank you. Christoper Fabian, PharmD, BCPS Clinical pharmacist, pager [phone] 03/15/2013,11:44 PM

## 2013-03-15 NOTE — Assessment & Plan Note (Addendum)
Patient presents with unstable angina. Her symptoms are classic. 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 Plan to admit and cycle enzymes. Treat with aspirin, heparin, coreg 6.25 mg by mouth twice a day and add pravachol 40 mg daily (patient did not tolerate crestor or lipitor in the past). She will need close followup of liver functions in 4 weeks as they're mildly elevated at baseline. Her LV function is reduced. Add low-dose lisinopril 2.5 mg daily and increase as needed. Discontinue HCTZ and Cardizem. Proceed with cardiac catheterization tomorrow morning. The risks and benefits were discussed and the patient agrees to proceed.

## 2013-03-15 NOTE — Progress Notes (Addendum)
[facility] SITE 3 NUCLEAR MED [address] [place], [place] [address] [phone]    Cardiology Nuclear Med Study  Veronica Barron is a 71 y.o. female     MRN : [id number]     DOB: [birth year]  Procedure Date: 03/15/2013  Nuclear Med Background Indication for Stress Test:  Evaluation for Ischemia History:  No previously documented CAD Cardiac Risk Factors: Hypertension and Lipids  Symptoms:  Chest Pressure with Exertion (last date of chest discomfort was about 2-days ago), Fatigue, Palpitations and Rapid HR   Nuclear Pre-Procedure Caffeine/Decaff Intake:  8:00pm NPO After: 8:00pm   Lungs:  Clear. O2 Sat: 96% on room air. IV 0.9% NS with Angio Cath:  22g  IV Site: R Hand  IV Started by:  Cathlyn Parsons, RN  Chest Size (in):  40 Cup Size: C  Height: 5\' 4"  (1.626 m)  Weight:  156 lb (70.761 kg)  BMI:  Body mass index is 26.76 kg/(m^2). Tech Comments:  Patient was seen by Dr. Jens Som (DOD) after stress images and admitted to hospital today for cath tomorrow.    Nuclear Med Study 1 or 2 day study: 1 day  Stress Test Type:  Stress  Reading MD: Kristeen Miss, MD  Order Authorizing Provider:  Illene Regulus, MD  Resting Radionuclide: Technetium 64m Sestamibi  Resting Radionuclide Dose: 11.0 mCi   Stress Radionuclide:  Technetium 25m Sestamibi  Stress Radionuclide Dose: 33.0 mCi           Stress Protocol Rest HR: 74 Stress HR: 125  Rest BP: 162/86 Stress BP: 183/87  Exercise Time (min): 8:00 METS: 7.0   Predicted Max HR: 148 bpm % Max HR: 84.46 bpm Rate Pressure Product: 86578   Dose of Adenosine (mg):  n/a Dose of Lexiscan: n/a mg  Dose of Atropine (mg): n/a Dose of Dobutamine: n/a mcg/kg/min (at max HR)  Stress Test Technologist: Smiley Houseman, CMA-N  Nuclear Technologist:  Domenic Polite, CNMT     Rest Procedure:  Myocardial perfusion imaging was performed at rest 45 minutes following the intravenous administration of Technetium 58m Sestamibi.  Rest  ECG: There are anterior Q waves c/w a previous anterior MI.  Stress Procedure:  The patient exercised on the treadmill utilizing the Bruce Protocol for eight minutes. The patient stopped due to chest pressure and ST changes.  Technetium 31m Sestamibi was injected at peak exercise and myocardial perfusion imaging was performed after a brief delay.  Stress ECG: She had ST elevation in V2 and V3.   QPS Raw Data Images:  Normal; no motion artifact; normal heart/lung ratio. Stress Images:  There is a large, severe defect involving the mid and distal anterior wall and apex.   The uptake in the remaining segments is normal.      Rest Images:  There is a large, severe defect involving the mid and distal anterior wall and apex.   The uptake in the remaining segments is normal. Subtraction (SDS):  No evidence of ischemia. Transient Ischemic Dilatation (Normal <1.22):  n/a Lung/Heart Ratio (Normal <0.45):  0.50  Quantitative Gated Spect Images QGS EDV:  132 ml QGS ESV:  94 ml  Impression Exercise Capacity:  Good exercise capacity. BP Response:  Normal blood pressure response. Clinical Symptoms:  Mild chest pain/dyspnea. ECG Impression:  She had ST elevation in the anterior leads.  Comparison with Prior Nuclear Study: No previous nuclear study performed  Overall Impression:  High risk stress nuclear study .  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  There is no evidence of ischemia. .  LV Ejection Fraction: 29%.  LV Wall Motion:  There is akinesis of the mid and distal anterior wall and apex.  The base contracts normally.  The patient was admitted to the hospital for evaluation and cardiac cath.   Vesta Mixer, Montez Hageman., MD, [facility] 03/15/2013, 4:33 PM Office - [phone] Pager [phone]

## 2013-03-15 NOTE — H&P (Signed)
Veronica Barron  03/15/2013 12:45 PM   Office Visit  MRN:  [id number]   Description: 72 year old female  Provider: Lewayne Bunting, MD  Department: [facility]        Referring Provider    Jacques Navy, MD      Diagnoses    Intermediate coronary syndrome    -  Primary    411.1    Other and unspecified hyperlipidemia        272.4    Unspecified essential hypertension        401.9      Reason for Visit    Chest Pain    New patient evaluation         Progress Notes    Lewayne Bunting, MD at 03/15/2013  1:15 PM    Status: Signed                    HPI: 72 year old female for evaluation of chest pain. Over the past 6 months she has noticed substernal chest pain described as a pressure. It occurs with activities and is relieved with rest. The pain does not radiate and there are no associated symptoms. Her symptoms have occurred with less activity recently. She was seen by primary care and a functional study was ordered. Nuclear study performed in the office today shows an ejection fraction of 29%. There is a large prior anterior, apical and lateral infarct with very mild peri-infarct ischemia. There was transient ST elevation anteriorly. Recent laboratories in August of 2014 showed mildly elevated SGOT of 41 and SGPT of 38. BUN and creatinine 9/0.7.     Current Outpatient Prescriptions   Medication  Sig  Dispense  Refill   .  calcium carbonate (OS-CAL) 600 MG TABS  Take 600 mg by mouth daily.         .  clorazepate (TRANXENE-T) 7.5 MG tablet  Take 1 tablet (7.5 mg total) by mouth at bedtime as needed for anxiety.   30 tablet   3   .  Cyanocobalamin (VITAMIN B 12 PO)  Take 1 capsule by mouth daily.         Marland Kitchen  diltiazem (DILACOR XR) 180 MG 24 hr capsule  Take 1 capsule (180 mg total) by mouth daily.   90 capsule   3   .  hydrochlorothiazide (HYDRODIURIL) 25 MG tablet  Take 1 tablet (25 mg total) by mouth daily.   90 tablet   3   .  levothyroxine (SYNTHROID,  LEVOTHROID) 100 MCG tablet  Take 1 tablet (100 mcg total) by mouth daily.   90 tablet   3   .  nitroGLYCERIN (NITROSTAT) 0.4 MG SL tablet  Place 1 tablet (0.4 mg total) under the tongue every 5 (five) minutes as needed for chest pain.   50 tablet   3   .  omeprazole (PRILOSEC) 40 MG capsule  Take 1 capsule (40 mg total) by mouth daily.   90 capsule   3   .  sertraline (ZOLOFT) 100 MG tablet  Take 0.5 tablets (50 mg total) by mouth daily.   90 tablet   3   .  vitamin C (ASCORBIC ACID) 500 MG tablet  Take 500 mg by mouth daily.             No current facility-administered medications for this visit.         Allergies   Allergen  Reactions   .  Diphenhydramine Hcl         REACTION: TACHYCARDIA        Past Medical History   Diagnosis  Date   .  Leiomyoma of uterus, unspecified     .  Unspecified hypothyroidism     .  Hypertension     .  Hyperlipidemia     .  Allergic rhinitis, cause unspecified     .  Diverticulosis of colon           Past Surgical History   Procedure  Laterality  Date   .  Abdominal hysterectomy       .  Tubal ligation       .  Cholecystectomy             History       Social History   .  Marital Status:  Married       Spouse Name:  N/A       Number of Children:  3   .  Years of Education:  N/A       Occupational History   .           Social History Main Topics   .  Smoking status:  Never Smoker    .  Smokeless tobacco:  Never Used   .  Alcohol Use:  Yes         Comment: wine   .  Drug Use:  No   .  Sexual Activity:  Not on file       Other Topics  Concern   .  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         Family History   Problem  Relation  Age of Onset   .  Dementia  Mother     .  Hyperlipidemia  Mother     .  Hypertension  Mother     .  Coronary artery disease  Father     .  Hypertension   Father     .  Hyperlipidemia  Father          ROS: no fevers or chills, productive cough, hemoptysis, dysphasia, odynophagia, melena, hematochezia, dysuria, hematuria, rash, seizure activity, orthopnea, PND, pedal edema, claudication. Remaining systems are negative.   Physical Exam:    Blood pressure 147/79, pulse 85, height 5\' 4"  (1.626 m), weight 158 lb (71.668 kg).   General:  Well developed/well nourished in NAD Skin warm/dry Patient not depressed No peripheral clubbing Back-normal HEENT-normal/normal eyelids Neck supple/normal carotid upstroke bilaterally; no bruits; no JVD; no thyromegaly chest - CTA/ normal expansion CV - RRR/normal S1 and S2; no murmurs, rubs or gallops;  PMI nondisplaced Abdomen -NT/ND, no HSM, no mass, + bowel sounds, no bruit 2+ femoral pulses, no bruits Ext-no edema, chords, diminished DP Neuro-grossly nonfocal   ECG sinus rhythm, prior septal infarct.              Intermediate coronary syndrome - Lewayne Bunting, MD at 03/15/2013  1:14 PM    Status: Linus Orn Related Problem: Intermediate coronary syndrome           Patient presents with unstable angina. Her symptoms are classic. Her nuclear study is high risk as she had chest pain, ST elevation anteriorly and her images show a large prior infarct  with mild peri-infarct ischemia and significantly reduced LV function. Plan to admit and cycle enzymes. Treat with aspirin, heparin, coreg 6.25 mg by mouth twice a day and add pravachol 40 mg daily (patient did not tolerate crestor or lipitor in the past). She will need close followup of liver functions in 4 weeks as they're mildly elevated at baseline. Her LV function is reduced. Add low-dose lisinopril 2.5 mg daily and increase as needed. Discontinue HCTZ and Cardizem. Proceed with cardiac catheterization tomorrow morning. The risks and benefits were discussed and the patient agrees to proceed.           Revision History       Date/Time User Action     > 03/15/2013  1:14 PM Lewayne Bunting, MD Edit      03/15/2013  1:12 PM Lewayne Bunting, MD Create              HYPERLIPIDEMIA - Lewayne Bunting, MD at 03/15/2013  1:13 PM    Status: Written Related Problem: HYPERLIPIDEMIA           Patient has not tolerated Crestor or Lipitor previously. We will try Pravachol 40 mg daily to see she tolerates. Check lipids, liver and CK in 4 weeks.         HYPERTENSION - Lewayne Bunting, MD at 03/15/2013  1:15 PM    Status: Written Related Problem: HYPERTENSION           Plan discontinue Cardizem and HCTZ. Given reduced LV function we will instead treat with beta-blockade and an ACE inhibitor. Increase as needed.               Vitals - Last Recorded    BP Pulse Ht Wt BMI      147/79 85 5\' 4"  (1.626 m) 158 lb (71.668 kg) 27.11 kg/m2

## 2013-03-15 NOTE — Progress Notes (Signed)
Courtesy note: Reviewed myoview and Dr. Waunita Schooner note. For cath in AM. Reviewed potential outcomes with her and the family: PTCA/STent and possibly CABG in the face of severe multi-vessel disease.  We see tomorrow evening.  Thanks

## 2013-03-15 NOTE — Progress Notes (Signed)
Utilization review completed.  

## 2013-03-15 NOTE — Assessment & Plan Note (Signed)
Plan discontinue Cardizem and HCTZ. Given reduced LV function we will instead treat with beta-blockade and an ACE inhibitor. Increase as needed.

## 2013-03-15 NOTE — Progress Notes (Addendum)
ANTICOAGULATION CONSULT NOTE - Initial Consult  Pharmacy Consult for Heparin Indication: chest pain/ACS  Allergies  Allergen Reactions  . Diphenhydramine Hcl     REACTION: TACHYCARDIA    Patient Measurements: Height: 5\' 4"  (162.6 cm) Weight: 157 lb 13.6 oz (71.6 kg) IBW/kg (Calculated) : 54.7  Vital Signs: Temp: 97.8 F (36.6 C) (09/02 1406) Temp src: Oral (09/02 1406) BP: 161/90 mmHg (09/02 1406) Pulse Rate: 97 (09/02 1406)  Labs: No results found for this basename: HGB, HCT, PLT, APTT, LABPROT, INR, HEPARINUNFRC, CREATININE, CKTOTAL, CKMB, TROPONINI,  in the last 72 hours  Estimated Creatinine Clearance: 61.7 ml/min (by C-G formula based on Cr of 0.7).   Medical History: Past Medical History  Diagnosis Date  . Leiomyoma of uterus, unspecified   . Unspecified hypothyroidism   . Hypertension   . Hyperlipidemia   . Allergic rhinitis, cause unspecified   . Diverticulosis of colon     Assessment: 72 year old admitted with ACS, Scr stable  Goal of Therapy:  Heparin level 0.3-0.7 units/ml Monitor platelets by anticoagulation protocol: Yes   Plan:  1) Heparin 3500 units iv bolus x 1 2) Heparin drip at 1000 units / hr 3) Heparin level 6 hours after heparin begins 4) Daily heparin level, CBC  Thank you. Okey Regal, PharmD [phone]  03/15/2013,2:59 PM

## 2013-03-15 NOTE — Progress Notes (Signed)
HPI: 72 year old female for evaluation of chest pain. Over the past 6 months she has noticed substernal chest pain described as a pressure. It occurs with activities and is relieved with rest. The pain does not radiate and there are no associated symptoms. Her symptoms have occurred with less activity recently. She was seen by primary care and a functional study was ordered. Nuclear study performed in the office today shows an ejection fraction of 29%. There is a large prior anterior, apical and lateral infarct with very mild peri-infarct ischemia. There was transient ST elevation anteriorly. Recent laboratories in August of 2014 showed mildly elevated SGOT of 41 and SGPT of 38. BUN and creatinine 9/0.7.   Current Outpatient Prescriptions  Medication Sig Dispense Refill  . calcium carbonate (OS-CAL) 600 MG TABS Take 600 mg by mouth daily.      . clorazepate (TRANXENE-T) 7.5 MG tablet Take 1 tablet (7.5 mg total) by mouth at bedtime as needed for anxiety.  30 tablet  3  . Cyanocobalamin (VITAMIN B 12 PO) Take 1 capsule by mouth daily.      Marland Kitchen diltiazem (DILACOR XR) 180 MG 24 hr capsule Take 1 capsule (180 mg total) by mouth daily.  90 capsule  3  . hydrochlorothiazide (HYDRODIURIL) 25 MG tablet Take 1 tablet (25 mg total) by mouth daily.  90 tablet  3  . levothyroxine (SYNTHROID, LEVOTHROID) 100 MCG tablet Take 1 tablet (100 mcg total) by mouth daily.  90 tablet  3  . nitroGLYCERIN (NITROSTAT) 0.4 MG SL tablet Place 1 tablet (0.4 mg total) under the tongue every 5 (five) minutes as needed for chest pain.  50 tablet  3  . omeprazole (PRILOSEC) 40 MG capsule Take 1 capsule (40 mg total) by mouth daily.  90 capsule  3  . sertraline (ZOLOFT) 100 MG tablet Take 0.5 tablets (50 mg total) by mouth daily.  90 tablet  3  . vitamin C (ASCORBIC ACID) 500 MG tablet Take 500 mg by mouth daily.       No current facility-administered medications for this visit.    Allergies  Allergen Reactions  .  Diphenhydramine Hcl     REACTION: TACHYCARDIA    Past Medical History  Diagnosis Date  . Leiomyoma of uterus, unspecified   . Unspecified hypothyroidism   . Hypertension   . Hyperlipidemia   . Allergic rhinitis, cause unspecified   . Diverticulosis of colon     Past Surgical History  Procedure Laterality Date  . Abdominal hysterectomy    . Tubal ligation    . Cholecystectomy      History   Social History  . Marital Status: Married    Spouse Name: N/A    Number of Children: 3  . Years of Education: N/A   Occupational History  .     Social History Main Topics  . Smoking status: Never Smoker   . Smokeless tobacco: Never Used  . Alcohol Use: Yes     Comment: wine  . Drug Use: No  . Sexual Activity: Not on file   Other Topics Concern  . 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    Family History  Problem Relation Age of Onset  . Dementia Mother   . Hyperlipidemia Mother   . Hypertension Mother   . Coronary artery disease Father   . Hypertension Father   .  Hyperlipidemia Father     ROS: no fevers or chills, productive cough, hemoptysis, dysphasia, odynophagia, melena, hematochezia, dysuria, hematuria, rash, seizure activity, orthopnea, PND, pedal edema, claudication. Remaining systems are negative.  Physical Exam:   Blood pressure 147/79, pulse 85, height 5\' 4"  (1.626 m), weight 158 lb (71.668 kg).  General:  Well developed/well nourished in NAD Skin warm/dry Patient not depressed No peripheral clubbing Back-normal HEENT-normal/normal eyelids Neck supple/normal carotid upstroke bilaterally; no bruits; no JVD; no thyromegaly chest - CTA/ normal expansion CV - RRR/normal S1 and S2; no murmurs, rubs or gallops;  PMI nondisplaced Abdomen -NT/ND, no HSM, no mass, + bowel sounds, no bruit 2+ femoral pulses, no bruits Ext-no edema,  chords, diminished DP Neuro-grossly nonfocal  ECG sinus rhythm, prior septal infarct.

## 2013-03-16 ENCOUNTER — Encounter (HOSPITAL_COMMUNITY)
Admission: AD | Disposition: A | Discharge status: Home / Self Care | Payer: Self-pay | Source: Ambulatory Visit | Attending: Cardiology

## 2013-03-16 ENCOUNTER — Other Ambulatory Visit: Payer: Self-pay

## 2013-03-16 DIAGNOSIS — E785 Hyperlipidemia, unspecified: Secondary | ICD-10-CM

## 2013-03-16 DIAGNOSIS — I2510 Atherosclerotic heart disease of native coronary artery without angina pectoris: Secondary | ICD-10-CM

## 2013-03-16 DIAGNOSIS — I10 Essential (primary) hypertension: Secondary | ICD-10-CM

## 2013-03-16 DIAGNOSIS — I2109 ST elevation (STEMI) myocardial infarction involving other coronary artery of anterior wall: Secondary | ICD-10-CM

## 2013-03-16 HISTORY — PX: PERCUTANEOUS CORONARY STENT INTERVENTION (PCI-S): SHX5485

## 2013-03-16 HISTORY — PX: LEFT HEART CATHETERIZATION WITH CORONARY ANGIOGRAM: SHX5451

## 2013-03-16 LAB — CBC
MCH: 29.5 pg (ref 26.0–34.0)
MCH: 30.3 pg (ref 26.0–34.0)
MCHC: 34.5 g/dL (ref 30.0–36.0)
MCV: 85.4 fL (ref 78.0–100.0)
MCV: 85.3 fL (ref 78.0–100.0)
Platelets: 278 10*3/uL (ref 150–400)
Platelets: 272 10*3/uL (ref 150–400)
RDW: 13.7 % (ref 11.5–15.5)
RDW: 13.8 % (ref 11.5–15.5)
WBC: 8.9 10*3/uL (ref 4.0–10.5)

## 2013-03-16 LAB — LIPID PANEL
Cholesterol: 251 mg/dL — ABNORMAL HIGH (ref 0–200)
Total CHOL/HDL Ratio: 6.4 RATIO
Triglycerides: 390 mg/dL — ABNORMAL HIGH (ref <150)
VLDL: 78 mg/dL — ABNORMAL HIGH (ref 0–40)

## 2013-03-16 LAB — POCT ACTIVATED CLOTTING TIME
Activated Clotting Time: 232 seconds
Activated Clotting Time: 309 seconds

## 2013-03-16 SURGERY — LEFT HEART CATHETERIZATION WITH CORONARY ANGIOGRAM
Anesthesia: LOCAL

## 2013-03-16 MED ORDER — MIDAZOLAM HCL 2 MG/2ML IJ SOLN
INTRAMUSCULAR | Status: AC
  Filled 2013-03-16: qty 2

## 2013-03-16 MED ORDER — TICAGRELOR 90 MG PO TABS
ORAL_TABLET | ORAL | Status: AC
  Administered 2013-03-16: 23:00:00 90 mg via ORAL
  Filled 2013-03-16: qty 2

## 2013-03-16 MED ORDER — TICAGRELOR 90 MG PO TABS
90.0000 mg | ORAL_TABLET | Freq: Two times a day (BID) | ORAL | Status: DC
  Administered 2013-03-16 – 2013-03-17 (×2): 90 mg via ORAL
  Filled 2013-03-16 (×3): qty 1

## 2013-03-16 MED ORDER — LIDOCAINE HCL (PF) 1 % IJ SOLN
INTRAMUSCULAR | Status: AC
  Filled 2013-03-16: qty 30

## 2013-03-16 MED ORDER — SODIUM CHLORIDE 0.9 % IV SOLN: 1.0000 mL/kg/h | INTRAVENOUS | Status: AC

## 2013-03-16 MED ORDER — NITROGLYCERIN 0.2 MG/ML ON CALL CATH LAB
INTRAVENOUS | Status: AC
  Filled 2013-03-16: qty 1

## 2013-03-16 MED ORDER — VERAPAMIL HCL 2.5 MG/ML IV SOLN
INTRAVENOUS | Status: AC
  Filled 2013-03-16: qty 2

## 2013-03-16 MED ORDER — FENTANYL CITRATE 0.05 MG/ML IJ SOLN
INTRAMUSCULAR | Status: AC
  Filled 2013-03-16: qty 2

## 2013-03-16 MED ORDER — HEPARIN SODIUM (PORCINE) 1000 UNIT/ML IJ SOLN
INTRAMUSCULAR | Status: AC
  Filled 2013-03-16: qty 1

## 2013-03-16 MED ORDER — HEPARIN SODIUM (PORCINE) 5000 UNIT/ML IJ SOLN
5000.0000 [IU] | Freq: Three times a day (TID) | INTRAMUSCULAR | Status: DC
  Administered 2013-03-16 – 2013-03-17 (×2): 5000 [IU] via SUBCUTANEOUS
  Filled 2013-03-16 (×5): qty 1

## 2013-03-16 MED ORDER — HEPARIN (PORCINE) IN NACL 2-0.9 UNIT/ML-% IJ SOLN
INTRAMUSCULAR | Status: AC
  Filled 2013-03-16: qty 1500

## 2013-03-16 NOTE — Interval H&P Note (Signed)
History and Physical Interval Note:  03/16/2013 10:24 AM  Veronica Barron  has presented today for surgery, with the diagnosis of Chest pain  The various methods of treatment have been discussed with the patient and family. After consideration of risks, benefits and other options for treatment, the patient has consented to  Procedure(s): LEFT HEART CATHETERIZATION WITH CORONARY ANGIOGRAM (N/A) as a surgical intervention .  The patient's history has been reviewed, patient examined, no change in status, stable for surgery.  I have reviewed the patient's chart and labs.  Questions were answered to the patient's satisfaction.    Cath Lab Visit (complete for each Cath Lab visit)  Clinical Evaluation Leading to the Procedure:   ACS: yes  Non-ACS:    Anginal Classification: CCS III  Anti-ischemic medical therapy: Minimal Therapy (1 class of medications)  Non-Invasive Test Results: High-risk stress test findings: cardiac mortality >3%/year  Prior CABG: No previous CABG       Veronica Barron [place] 03/16/2013 10:24 AM

## 2013-03-16 NOTE — H&P (View-Only) (Signed)
   Subjective:  Denies CP or dyspnea   Objective:  Filed Vitals:   03/15/13 1406 03/15/13 2100 03/16/13 0542  BP: 161/90 150/89 102/66  Pulse: 97 83 74  Temp: 97.8 F (36.6 C) 97.5 F (36.4 C) 97.3 F (36.3 C)  TempSrc: Oral Oral   Resp: 20 20 18  Height: 5' 4" (1.626 m)    Weight: 157 lb 13.6 oz (71.6 kg)  155 lb 9.6 oz (70.58 kg)  SpO2: 96% 96% 97%    Intake/Output from previous day:  Intake/Output Summary (Last 24 hours) at 03/16/13 0740 Last data filed at 03/15/13 2016  Gross per 24 hour  Intake    360 ml  Output      0 ml  Net    360 ml    Physical Exam: Physical exam: Well-developed well-nourished in no acute distress.  Skin is warm and dry.  HEENT is normal.  Neck is supple.  Chest is clear to auscultation with normal expansion.  Cardiovascular exam is regular rate and rhythm.  Abdominal exam nontender or distended. No masses palpated. Extremities show no edema. neuro grossly intact    Lab Results: Basic Metabolic Panel:  Recent Labs  03/15/13 1825  CREATININE 0.71   CBC:  Recent Labs  03/15/13 1825 03/16/13 0514  WBC 11.3* 9.2  NEUTROABS 6.7  --   HGB 15.6* 14.5  HCT 44.0 42.0  MCV 85.1 85.4  PLT 345 278   Cardiac Enzymes:  Recent Labs  03/15/13 1825  TROPONINI <0.30     Assessment/Plan:  1 UA-Patient presents with classic symptoms. Nuclear study markedly abnormal with reduced LV function, prior infarct and mild peri-infarct ischemia. Also with transient ST elevation with exercise and chest pain. Proceed with cardiac catheterization today. The risks and benefits were discussed and the patient agrees to proceed. Continue aspirin, heparin, beta blocker and statin. 2 ischemic cardiomyopathy-continue Coreg and ACE inhibitor. Titrate medications as tolerated by pulse and blood pressure. 3 hyperlipidemia-patient has not tolerated Crestor and Lipitor previously. Pravachol added. Check liver functions in 4 weeks as they are mildly elevated  at baseline. 4 hypertension - watch blood pressure and increase medications as needed.   Veronica Barron 03/16/2013, 7:40 AM    

## 2013-03-16 NOTE — CV Procedure (Signed)
   Cardiac Catheterization Procedure Note  Name: Veronica Barron MRN: [id number] DOB: 1941/06/09  Procedure: Left Heart Cath, Selective Coronary Angiography, LV angiography, PTCA and stenting of the LAD  Indication: 72 year old white female with history of hypertension and hyperlipidemia presents with symptoms of unstable angina. She had a high-risk stress Myoview study showing extensive anterior and apical infarct with peri-infarct ischemia. Ejection fraction was 29%.  Procedural Details:  The right wrist was prepped, draped, and anesthetized with 1% lidocaine. Using the modified Seldinger technique, a 6 French sheath was introduced into the right radial artery. 3 mg of verapamil was administered through the sheath, weight-based unfractionated heparin was administered intravenously. Standard Judkins catheters were used for selective coronary angiography and left ventriculography. Catheter exchanges were performed over an exchange length guidewire.  PROCEDURAL FINDINGS Hemodynamics: AO 132/65 with a mean of 90 mm mercury LV 137/16 mmHg   Coronary angiography: Coronary dominance: right  Left mainstem: The left main is short and without significant disease.  Left anterior descending (LAD): The left anterior descending artery is a large vessel that extends to the apex. It has a complex 99% stenosis in the proximal vessel with TIMI grade 1 flow.  Left circumflex (LCx): The left circumflex is a large vessel that gives rise to a large first obtuse marginal vessel. Following this obtuse marginal vessel the left circumflex is occluded. There are left to left collaterals to a small obtuse marginal vessel.  Right coronary artery (RCA): The right coronary is a dominant vessel. It is calcified at the ostium. In the proximal vessel there is mild disease up to 20%. There are right-to-left collaterals to the LAD.  Left ventriculography: Left ventricular systolic function is abnormal, LVEF is estimated at  30%, there is severe hypokinesis of the mid to distal anterior wall with akinesis of the entire fracture. there is no significant mitral regurgitation   PCI Note:  Following the diagnostic procedure, the decision was made to proceed with PCI of the LAD. Brilinta 180 mg was given orally. Weight-based heparin was given for anticoagulation. Once a therapeutic ACT was achieved, a 6 [place] XB LAD 3.5 guide catheter was inserted.  A pro-water coronary guidewire was used to cross the lesion.  The lesion was predilated with a 2.5 mm balloon.  The lesion was then stented with a 2.5 x 28 mm Promus premier stent.  The stent was postdilated with a 3.0 mm noncompliant balloon.  Following PCI, there was 0% residual stenosis and TIMI-3 flow. Final angiography confirmed an excellent result. The patient tolerated the procedure well. There were no immediate procedural complications. A TR band was used for radial hemostasis. The patient was transferred to the post catheterization recovery area for further monitoring.  PCI Data: Vessel - LAD/Segment - proximal Percent Stenosis (pre)  99% TIMI-flow 1 Stent 2.5 x 28 mm Promus premier Percent Stenosis (post) 0% TIMI-flow (post) 3  Final Conclusions:   1. 2 vessel obstructive coronary disease. There was a critical stenosis in the proximal LAD that was felt to be the culprit vessel. 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 2. Severe left ventricular dysfunction. 3. Successful stenting of the proximal LAD with a drug-eluting stent.   Recommendations:  Continue dual antiplatelet therapy with aspirin 81 mg and Brilinta for one year.  Theron Arista [facility] 03/16/2013, 11:26 AM

## 2013-03-16 NOTE — Progress Notes (Signed)
Utilization review completed.  

## 2013-03-16 NOTE — Progress Notes (Signed)
TR BAND REMOVAL  LOCATION:    right radial  DEFLATED PER PROTOCOL:    yes  TIME BAND OFF / DRESSING APPLIED:    1515   SITE UPON ARRIVAL:    Level 0  SITE AFTER BAND REMOVAL:    Level 0  REVERSE ALLEN'S TEST:     positive  CIRCULATION SENSATION AND MOVEMENT:    Within Normal Limits   yes  COMMENTS:   Tolerated procedure well  

## 2013-03-16 NOTE — Progress Notes (Signed)
Courtesy note: chart and reports reviewed. Patient did well with PCI/Stent LAD. She is feeling good.  Recommend that she be seen in lipid clinic as an outpatient for cholesterol management given her intolerance of statins. May do well with Welchol plus Zetia but would defer to Pharm D in clinic.  Thanks for the great care rendered to my patient.  M.Norins, MD

## 2013-03-16 NOTE — Progress Notes (Signed)
   Subjective:  Denies CP or dyspnea   Objective:  Filed Vitals:   03/15/13 1406 03/15/13 2100 03/16/13 0542  BP: 161/90 150/89 102/66  Pulse: 97 83 74  Temp: 97.8 F (36.6 C) 97.5 F (36.4 C) 97.3 F (36.3 C)  TempSrc: Oral Oral   Resp: 20 20 18   Height: 5\' 4"  (1.626 m)    Weight: 157 lb 13.6 oz (71.6 kg)  155 lb 9.6 oz (70.58 kg)  SpO2: 96% 96% 97%    Intake/Output from previous day:  Intake/Output Summary (Last 24 hours) at 03/16/13 0740 Last data filed at 03/15/13 2016  Gross per 24 hour  Intake    360 ml  Output      0 ml  Net    360 ml    Physical Exam: Physical exam: Well-developed well-nourished in no acute distress.  Skin is warm and dry.  HEENT is normal.  Neck is supple.  Chest is clear to auscultation with normal expansion.  Cardiovascular exam is regular rate and rhythm.  Abdominal exam nontender or distended. No masses palpated. Extremities show no edema. neuro grossly intact    Lab Results: Basic Metabolic Panel:  Recent Labs  78/29/56 1825  CREATININE 0.71   CBC:  Recent Labs  03/15/13 1825 03/16/13 0514  WBC 11.3* 9.2  NEUTROABS 6.7  --   HGB 15.6* 14.5  HCT 44.0 42.0  MCV 85.1 85.4  PLT 345 278   Cardiac Enzymes:  Recent Labs  03/15/13 1825  TROPONINI <0.30     Assessment/Plan:  1 UA-Patient presents with classic symptoms. Nuclear study markedly abnormal with reduced LV function, prior infarct and mild peri-infarct ischemia. Also with transient ST elevation with exercise and chest pain. Proceed with cardiac catheterization today. The risks and benefits were discussed and the patient agrees to proceed. Continue aspirin, heparin, beta blocker and statin. 2 ischemic cardiomyopathy-continue Coreg and ACE inhibitor. Titrate medications as tolerated by pulse and blood pressure. 3 hyperlipidemia-patient has not tolerated Crestor and Lipitor previously. Pravachol added. Check liver functions in 4 weeks as they are mildly elevated  at baseline. 4 hypertension - watch blood pressure and increase medications as needed.   Olga Millers 03/16/2013, 7:40 AM

## 2013-03-17 ENCOUNTER — Inpatient Hospital Stay (HOSPITAL_COMMUNITY): Payer: Medicare Other

## 2013-03-17 ENCOUNTER — Other Ambulatory Visit: Payer: Self-pay | Admitting: Nurse Practitioner

## 2013-03-17 ENCOUNTER — Encounter (HOSPITAL_COMMUNITY): Payer: Self-pay | Admitting: Nurse Practitioner

## 2013-03-17 DIAGNOSIS — I5022 Chronic systolic (congestive) heart failure: Secondary | ICD-10-CM

## 2013-03-17 DIAGNOSIS — I200 Unstable angina: Secondary | ICD-10-CM

## 2013-03-17 LAB — CBC
MCV: 85.6 fL (ref 78.0–100.0)
Platelets: 264 10*3/uL (ref 150–400)
RBC: 4.58 MIL/uL (ref 3.87–5.11)
RDW: 14 % (ref 11.5–15.5)
WBC: 8.6 10*3/uL (ref 4.0–10.5)

## 2013-03-17 LAB — BASIC METABOLIC PANEL
CO2: 23 mEq/L (ref 19–32)
Chloride: 108 mEq/L (ref 96–112)
Creatinine, Ser: 0.65 mg/dL (ref 0.50–1.10)
GFR calc Af Amer: >90 mL/min (ref >90)
Potassium: 3.2 mEq/L — ABNORMAL LOW (ref 3.5–5.1)
Sodium: 144 mEq/L (ref 135–145)

## 2013-03-17 MED ORDER — CARVEDILOL 6.25 MG PO TABS: 6.2500 mg | ORAL_TABLET | Freq: Two times a day (BID) | ORAL | Status: DC

## 2013-03-17 MED ORDER — LISINOPRIL 5 MG PO TABS: 5.0000 mg | ORAL_TABLET | Freq: Every day | ORAL | Status: DC

## 2013-03-17 MED ORDER — PRAVASTATIN SODIUM 40 MG PO TABS: 40.0000 mg | ORAL_TABLET | Freq: Every day | ORAL | Status: DC

## 2013-03-17 MED ORDER — TICAGRELOR 90 MG PO TABS: 90.0000 mg | ORAL_TABLET | Freq: Two times a day (BID) | ORAL | Status: DC

## 2013-03-17 MED ORDER — LISINOPRIL 5 MG PO TABS
5.0000 mg | ORAL_TABLET | Freq: Every day | ORAL | Status: DC
  Administered 2013-03-17: 10:00:00 5 mg via ORAL
  Filled 2013-03-17: qty 1

## 2013-03-17 MED ORDER — POTASSIUM CHLORIDE CRYS ER 20 MEQ PO TBCR
40.0000 meq | EXTENDED_RELEASE_TABLET | Freq: Once | ORAL | Status: AC
  Administered 2013-03-17: 09:00:00 40 meq via ORAL
  Filled 2013-03-17: qty 2

## 2013-03-17 NOTE — Progress Notes (Addendum)
   Subjective:  Denies CP or dyspnea   Objective:  Filed Vitals:   03/16/13 2018 03/16/13 2326 03/17/13 0300 03/17/13 0445  BP: 135/67 122/75  140/74  Pulse: 75 71  73  Temp: 97.4 F (36.3 C) 97.7 F (36.5 C)  98 F (36.7 C)  TempSrc: Oral Oral  Oral  Resp: 18 20  16   Height:      Weight:   157 lb 10.1 oz (71.5 kg)   SpO2: 97% 97%  96%    Intake/Output from previous day:  Intake/Output Summary (Last 24 hours) at 03/17/13 0707 Last data filed at 03/17/13 0700  Gross per 24 hour  Intake 860.11 ml  Output   1550 ml  Net -689.89 ml    Physical Exam: Physical exam: Well-developed well-nourished in no acute distress.  Skin is warm and dry.  HEENT is normal.  Neck is supple.  Chest is clear to auscultation with normal expansion.  Cardiovascular exam is regular rate and rhythm.  Abdominal exam nontender or distended. No masses palpated. Extremities show no edema. Radial cath site without hematoma neuro grossly intact    Lab Results: Basic Metabolic Panel:  Recent Labs  16/10/96 1200 03/17/13 0540  NA  --  144  K  --  3.2*  CL  --  108  CO2  --  23  GLUCOSE  --  124*  BUN  --  10  CREATININE 0.62 0.65  CALCIUM  --  9.0   CBC:  Recent Labs  03/15/13 1825  03/16/13 1200 03/17/13 0540  WBC 11.3*  < > 8.9 8.6  NEUTROABS 6.7  --   --   --   HGB 15.6*  < > 14.2 13.6  HCT 44.0  < > 40.0 39.2  MCV 85.1  < > 85.3 85.6  PLT 345  < > 272 264  < > = values in this interval not displayed. Cardiac Enzymes:  Recent Labs  03/15/13 1825  TROPONINI <0.30     Assessment/Plan:  1 UA-Patient doing well s/p PCI of LAD. Continue aspirin, brilinta, beta blocker and statin. 2 ischemic cardiomyopathy-continue Coreg and ACE inhibitor (increase lisinopril to 5 mg daily). Check BMET one week. Titrate medications as tolerated by pulse and blood pressure as outpatient; repeat echo 3 months; if EF < 35, will need ICD. 3 hyperlipidemia-patient has not tolerated Crestor  and Lipitor previously. Pravachol added. Check liver functions in 4 weeks as they are mildly elevated at baseline. 4 hypertension - watch blood pressure and increase medications as needed. Check PA and lateral chest xray prior to DC > 30 min PA and physician time D2 Olga Millers 03/17/2013, 7:07 AM

## 2013-03-17 NOTE — Discharge Summary (Signed)
See progress notes Petrita Blunck  

## 2013-03-17 NOTE — Progress Notes (Signed)
CARDIAC REHAB PHASE I   PRE:  Rate/Rhythm: 80 SR    BP: sitting 149/89    SaO2:   MODE:  Ambulation: 600 ft   POST:  Rate/Rhythm: 97 SR    BP: sitting 160/78     SaO2:   toleated well, no angina or c/o. BP elevated. Ed completed including HF. Pt thinking about CRPII and requests her name be sent to [facility]. [phone]   Elissa Lovett Warroad CES, ACSM 03/17/2013 9:52 AM

## 2013-03-17 NOTE — Care Management Note (Addendum)
  Page 1 of 1   03/17/2013     11:29:54 AM   CARE MANAGEMENT NOTE 03/17/2013  Patient:  Veronica Barron,Veronica Barron   Account Number:  [id number]  Date Initiated:  03/17/2013  Documentation initiated by:  [facility]  Subjective/Objective Assessment:   72 year old female for evaluation of chest pain. //home with spouse     Action/Plan:   left heart cath//benefits check for Brilinta   Anticipated DC Date:  03/17/2013   Anticipated DC Plan:  HOME/SELF CARE      DC Planning Services  CM consult      Choice offered to / List presented to:             Status of service:   Medicare Important Message given?   (If response is "NO", the following Medicare IM given date fields will be blank) Date Medicare IM given:   Date Additional Medicare IM given:    Discharge Disposition:    Per UR Regulation:    If discussed at Long Length of Stay Meetings, dates discussed:    Comments:  03/17/13 1100 Veronica Bomar, RN, BSN, [employer] [phone] Spoke with pt and husband at beside regarding benefits check of Brilinta.  Pt states that she utilizes [facility] mail order prescription company and [employer] in [place].  NCM called [facility] in [place] to confirm availibilty of medication.  NCM relayed importance of filling Brilinta upon d/c home.  No DME or HH needs identifed at this time.

## 2013-03-17 NOTE — Discharge Summary (Signed)
Patient ID: Veronica Barron,  MRN: [id number], DOB/AGE: 11/26/1940 72 y.o.  Admit date: 03/15/2013 Discharge date: 03/17/2013  Primary Care Provider: Illene Regulus Primary Cardiologist: B. Jens Som, MD   Discharge Diagnoses Principal Problem:   Unstable angina  **s/p cath this admission revealing  Active Problems:   HYPOTHYROIDISM   HYPERLIPIDEMIA   HYPERTENSION    Allergies Allergies  Allergen Reactions  . Diphenhydramine Hcl     REACTION: TACHYCARDIA   Procedures  Cardiac Catheterization and Percutaneous Coronary Intervention 9.3.2014  PROCEDURAL FINDINGS Hemodynamics: AO 132/65 with a mean of 90 mm mercury LV 137/16 mmHg              Coronary angiography: Coronary dominance: right  Left mainstem: The left main is short and without significant disease. Left anterior descending (LAD): The left anterior descending artery is a large vessel that extends to the apex. It has a complex 99% stenosis in the proximal vessel with TIMI grade 1 flow.    **The proximal LAD was successfully stented using a  2.5 x 28 mm Promus premier stent.**  Left circumflex (LCx): The left circumflex is a large vessel that gives rise to a large first obtuse marginal vessel. Following this obtuse marginal vessel the left circumflex is occluded. There are left to left collaterals to a small obtuse marginal vessel. Right coronary artery (RCA): The right coronary is a dominant vessel. It is calcified at the ostium. In the proximal vessel there is mild disease up to 20%. There are right-to-left collaterals to the LAD. Left ventriculography: Left ventricular systolic function is abnormal, LVEF is estimated at 30%, there is severe hypokinesis of the mid to distal anterior wall with akinesis of the entire fracture. there is no significant mitral regurgitation   Final Conclusions:   1. 2 vessel obstructive coronary disease. There was a critical stenosis in the proximal LAD that was felt to be the culprit vessel.  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 2. Severe left ventricular dysfunction. 3. Successful stenting of the proximal LAD with a drug-eluting stent.   Recommendations:  Continue dual antiplatelet therapy with aspirin 81 mg and Brilinta for one year. _____________   History of Present Illness  72 year old female who over the past 6 months is been experiencing substernal chest discomfort and pressure relieved with rest. She was seen by her primary care provider and a stress test was ordered. On September 2, she underwent nuclear stress testing in the [facility] cardiology office showing EF 29% with large prior anterior, apical and lateral infarcts with very mild peri-infarct ischemia. There is also transient ST segment elevation. She was seen acutely in clinic and admitted to [facility] for further evaluation.  Hospital Course  Following admission, patient ruled out for myocardial infarction. In the setting of newly diagnosed retinopathy, her home dose of diltiazem therapy was discontinued in favor of carvedilol. ACE inhibitor therapy was also initiated. She underwent diagnostic cardiac catheterization on September 3, revealing a 99% stenosis in the proximal LAD. The left circumflex was also occluded.  EF was measured at 30%. The LAD was felt to be the culprit vessel and was then successfully stented using a 2.5 x 20 mm Promus Premier drug-eluting stent. Patient tolerated procedure well and post procedure has been ambulating without recurrent symptoms or limitations. She's felt to be stable for discharge today. We have arranged for outpatient [facility] metabolic panel in one week as well as outpatient followup. We'll plan to repeat 2-D echocardiogram in approximately 3  months to reevaluate LV function and determine candidacy for ICD therapy.  Discharge Vitals Blood pressure 149/89, pulse 80, temperature 98 F (36.7 C), temperature source Oral, resp. rate 18, height 5\' 4"  (1.626 m),  weight 157 lb 10.1 oz (71.5 kg), SpO2 96.00%.  Filed Weights   03/15/13 1406 03/16/13 0542 03/17/13 0300  Weight: 157 lb 13.6 oz (71.6 kg) 155 lb 9.6 oz (70.58 kg) 157 lb 10.1 oz (71.5 kg)   Labs  CBC  Recent Labs  03/15/13 1825  03/16/13 1200 03/17/13 0540  WBC 11.3*  < > 8.9 8.6  NEUTROABS 6.7  --   --   --   HGB 15.6*  < > 14.2 13.6  HCT 44.0  < > 40.0 39.2  MCV 85.1  < > 85.3 85.6  PLT 345  < > 272 264  < > = values in this interval not displayed. Basic Metabolic Panel  Recent Labs  03/16/13 1200 03/17/13 0540  NA  --  144  K  --  3.2*  CL  --  108  CO2  --  23  GLUCOSE  --  124*  BUN  --  10  CREATININE 0.62 0.65  CALCIUM  --  9.0   Cardiac Enzymes  Recent Labs  03/15/13 1825  TROPONINI <0.30   Fasting Lipid Panel  Recent Labs  03/16/13 0500  CHOL 251*  HDL 39*  LDLCALC 134*  TRIG 390*  CHOLHDL 6.4   Thyroid Function Tests  Recent Labs  03/15/13 1825  TSH 2.208   Disposition  Pt is being discharged home today in good condition.  Follow-up Plans & Appointments      Follow-up Information   Follow up with Tereso Newcomer, PA-C On 04/01/2013. (11:10 AM)    Specialty:  Physician Assistant   Contact information:   [address] [place] [address] [phone]       Follow up with [facility] On 03/24/2013. (Blood chemisty.  present to [facility] lab anytime after 8:30 AM)       Follow up with Illene Regulus, MD. (as scheduled.)    Specialty:  Internal Medicine   Contact information:   [address] [place] [address] [phone]     Discharge Medications    Medication List    STOP taking these medications       diltiazem 180 MG 24 hr capsule  Commonly known as:  DILACOR XR     hydrochlorothiazide 25 MG tablet  Commonly known as:  HYDRODIURIL      TAKE these medications       aspirin EC 81 MG tablet  Take 81 mg by mouth daily.     calcium carbonate 600 MG Tabs tablet  Commonly  known as:  OS-CAL  Take 600 mg by mouth daily.     carvedilol 6.25 MG tablet  Commonly known as:  COREG  Take 1 tablet (6.25 mg total) by mouth 2 (two) times daily with a meal.     clorazepate 7.5 MG tablet  Commonly known as:  TRANXENE-T  Take 1 tablet (7.5 mg total) by mouth at bedtime as needed for anxiety.     levothyroxine 100 MCG tablet  Commonly known as:  SYNTHROID, LEVOTHROID  Take 1 tablet (100 mcg total) by mouth daily.     lisinopril 5 MG tablet  Commonly known as:  PRINIVIL,ZESTRIL  Take 1 tablet (5 mg total) by mouth daily.     multivitamin-lutein Caps capsule  Take 1 capsule by mouth daily.     nitroGLYCERIN 0.4 MG SL tablet  Commonly known as:  NITROSTAT  Place 1 tablet (0.4 mg total) under the tongue every 5 (five) minutes as needed for chest pain.     omeprazole 40 MG capsule  Commonly known as:  PRILOSEC  Take 1 capsule (40 mg total) by mouth daily.     pravastatin 40 MG tablet  Commonly known as:  PRAVACHOL  Take 1 tablet (40 mg total) by mouth at bedtime.     sertraline 100 MG tablet  Commonly known as:  ZOLOFT  Take 0.5 tablets (50 mg total) by mouth daily.     Ticagrelor 90 MG Tabs tablet  Commonly known as:  BRILINTA  Take 1 tablet (90 mg total) by mouth 2 (two) times daily.     VITAMIN B 12 PO  Take 1 capsule by mouth daily.     vitamin C 500 MG tablet  Commonly known as:  ASCORBIC ACID  Take 500 mg by mouth daily.      Outstanding Labs/Studies  F/u lipids/lft's in 4-6 wks.  F/u bmet in 1 wk.  Duration of Discharge Encounter   Greater than 30 minutes including physician time.  Signed, Nicolasa Ducking NP 03/17/2013, 12:50 PM

## 2013-03-22 ENCOUNTER — Telehealth: Payer: Self-pay | Admitting: Cardiology

## 2013-03-22 NOTE — Telephone Encounter (Signed)
Spoke with patient who states she has excruciating bilateral leg pain since starting Pravachol 3 days ago.  Patient states the pain is unbearable.  I advised patient that I will send message to Dr. Jens Som for advice.  Patient verbalized understanding and agreement with plan of care. Patient has taken Lipitor and Crestor in the past and did not tolerate those either; states this pain far worse than on either of those.

## 2013-03-22 NOTE — Telephone Encounter (Signed)
Dc pravachol Olga Millers

## 2013-03-22 NOTE — Telephone Encounter (Signed)
New Problem  Pt states the pain is excruciating in her leg//// says the Rx Pravachol is making it worse. Pt states she cannot tolerate the pain//

## 2013-03-24 ENCOUNTER — Telehealth: Payer: Self-pay

## 2013-03-24 ENCOUNTER — Other Ambulatory Visit (INDEPENDENT_AMBULATORY_CARE_PROVIDER_SITE_OTHER): Payer: Medicare Other

## 2013-03-24 DIAGNOSIS — I509 Heart failure, unspecified: Secondary | ICD-10-CM

## 2013-03-24 DIAGNOSIS — I5022 Chronic systolic (congestive) heart failure: Secondary | ICD-10-CM

## 2013-03-24 LAB — BASIC METABOLIC PANEL
CO2: 23 mEq/L (ref 19–32)
Calcium: 9.5 mg/dL (ref 8.4–10.5)
Glucose, Bld: 127 mg/dL — ABNORMAL HIGH (ref 70–99)
Potassium: 3.9 mEq/L (ref 3.5–5.1)
Sodium: 134 mEq/L — ABNORMAL LOW (ref 135–145)

## 2013-03-24 NOTE — Telephone Encounter (Signed)
**Note De-identified Aleni Andrus Obfuscation** Pt advised, she verbalized understanding. 

## 2013-03-24 NOTE — Telephone Encounter (Signed)
**Note De-Identified Veronica Barron Obfuscation** Pt is requesting to switch cardiologists. She is currently a Dr Jens Som pt and she states that she is not unhappy with him but she wants to see Dr Swaziland because one of her friends sees him and is really doing well under his care. Per Dr Jens Som pt can switch. [name], Dr [name] nurse is aware and will discuss with Dr Swaziland. Pt aware and knows that Dr Swaziland will not be in office until Monday. Will forward this note to both [facility] and Dr Swaziland.

## 2013-03-25 NOTE — Telephone Encounter (Signed)
OK by me to switch.  Peter Swaziland MD, [facility]

## 2013-03-28 ENCOUNTER — Telehealth: Payer: Self-pay | Admitting: Cardiology

## 2013-03-28 NOTE — Telephone Encounter (Signed)
Returning nurse call/sf

## 2013-03-28 NOTE — Telephone Encounter (Signed)
Returned call to patient appointment scheduled with Dr.Jordan 05/05/13.

## 2013-03-28 NOTE — Telephone Encounter (Signed)
Returned call to patient appointment scheduled with Dr.Jordan 05/05/13. 

## 2013-03-28 NOTE — Telephone Encounter (Signed)
Patient called no answer.LMTC. 

## 2013-04-01 ENCOUNTER — Encounter: Payer: Self-pay | Admitting: Physician Assistant

## 2013-04-01 ENCOUNTER — Ambulatory Visit (INDEPENDENT_AMBULATORY_CARE_PROVIDER_SITE_OTHER): Payer: Medicare Other | Admitting: Physician Assistant

## 2013-04-01 VITALS — BP 135/59 | HR 70 | Ht 64.0 in | Wt 153.4 lb

## 2013-04-01 DIAGNOSIS — E785 Hyperlipidemia, unspecified: Secondary | ICD-10-CM

## 2013-04-01 DIAGNOSIS — I255 Ischemic cardiomyopathy: Secondary | ICD-10-CM

## 2013-04-01 DIAGNOSIS — I2589 Other forms of chronic ischemic heart disease: Secondary | ICD-10-CM

## 2013-04-01 DIAGNOSIS — I10 Essential (primary) hypertension: Secondary | ICD-10-CM

## 2013-04-01 DIAGNOSIS — I2510 Atherosclerotic heart disease of native coronary artery without angina pectoris: Secondary | ICD-10-CM

## 2013-04-01 MED ORDER — ROSUVASTATIN CALCIUM 5 MG PO TABS: 5.0000 mg | ORAL_TABLET | Freq: Every day | ORAL | Status: DC

## 2013-04-01 MED ORDER — ROSUVASTATIN CALCIUM 5 MG PO TABS: 5.0000 mg | ORAL_TABLET | ORAL | Status: DC

## 2013-04-01 MED ORDER — LISINOPRIL 5 MG PO TABS: 5.0000 mg | ORAL_TABLET | Freq: Two times a day (BID) | ORAL | Status: DC

## 2013-04-01 NOTE — Progress Notes (Signed)
[address] [place], [place]  [address] Phone: [phone] Fax:  [phone]  Date:  04/01/2013   ID:  Veronica Barron, DOB 1940-11-03, MRN [id number]  PCP:  Illene Regulus, MD  Cardiologist:  Dr. Olga Millers => Dr. Peter Swaziland    History of Present Illness: Veronica Barron is a 72 y.o. female who returns for f/u after a recent admission to the hospital for unstable angina.  She has a hx of HTN, HL, hypothyroidism. She was referred to our office for stress testing due to substernal chest pain with exertion. Nuclear study was abnormal with an EF of 29%, large prior anterior, apical and lateral infarct with very mild peri-infarct ischemia and transient ST elevation anteriorly on ECGs. She was seen by Dr. Jens Som and admitted to the hospital for further evaluation and treatment. LHC 03/16/13: Complex proximal LAD 99%, CFX occluded with left to left collaterals to a small OM, proximal RCA 20%, right to left collaterals to the LAD, EF 30%, mid to distal anterior HK. PCI: Promus premier (2.5 x 20 mm) DES to the proximal LAD. Medical Rx recommended for chronic total occlusion of the CFX. Medications were adjusted. Follow up echocardiogram planned in 3 mos to assess for recovery of LV function. Of note, since d/c patient has stopped statin due to myalgias (intol to crestor, lipitor, pravastatin).  She has also requested to see Dr. Swaziland (knows someone who sees him).    Since discharge, she is doing well.  She denies further chest pain. She is walking 25 minutes twice a day without significant dyspnea. She denies orthopnea, PND or edema. She denies syncope. She sometimes gets dizzy with taking medications. She notes fatigue.  Labs (9/14):  K 3.2 => 3.9, Cr 0.8, ALT 38, TC 251, TG 390, HDL 39, LDL 134, Hgb 13.6, TSH 2.208   Wt Readings from Last 3 Encounters:  04/01/13 153 lb 6.4 oz (69.582 kg)  03/17/13 157 lb 10.1 oz (71.5 kg)  03/17/13 157 lb 10.1 oz (71.5 kg)     Past Medical  History  Diagnosis Date  . Leiomyoma of uterus, unspecified   . Unspecified hypothyroidism   . Hypertension   . Hyperlipidemia   . Allergic rhinitis, cause unspecified   . Diverticulosis of colon   . CAD (coronary artery disease)     a. 03/2013 Abnl MV, EF 29%;  b. 03/2013 Cath/PCI: >M nl, LAD 99p (2.5x28 Promus Prem DES), LCX 100 after large OM1, RCA dominant, 20p, EF 30%.  . Ischemic cardiomyopathy     a. 03/2013 EF 30% by LV gram.    Current Outpatient Prescriptions  Medication Sig Dispense Refill  . aspirin EC 81 MG tablet Take 81 mg by mouth daily.      . calcium carbonate (OS-CAL) 600 MG TABS Take 600 mg by mouth daily.      . carvedilol (COREG) 6.25 MG tablet Take 1 tablet (6.25 mg total) by mouth 2 (two) times daily with a meal.  60 tablet  0  . clorazepate (TRANXENE-T) 7.5 MG tablet Take 1 tablet (7.5 mg total) by mouth at bedtime as needed for anxiety.  30 tablet  3  . Cyanocobalamin (VITAMIN B 12 PO) Take 1 capsule by mouth daily.      Marland Kitchen levothyroxine (SYNTHROID, LEVOTHROID) 100 MCG tablet Take 1 tablet (100 mcg total) by mouth daily.  90 tablet  3  . lisinopril (PRINIVIL,ZESTRIL) 5 MG tablet Take 1 tablet (5 mg total) by mouth  daily.  30 tablet  0  . multivitamin-lutein (OCUVITE-LUTEIN) CAPS capsule Take 1 capsule by mouth daily.      . nitroGLYCERIN (NITROSTAT) 0.4 MG SL tablet Place 1 tablet (0.4 mg total) under the tongue every 5 (five) minutes as needed for chest pain.  50 tablet  3  . omeprazole (PRILOSEC) 40 MG capsule Take 1 capsule (40 mg total) by mouth daily.  90 capsule  3  . sertraline (ZOLOFT) 100 MG tablet Take 0.5 tablets (50 mg total) by mouth daily.  90 tablet  3  . Ticagrelor (BRILINTA) 90 MG TABS tablet Take 1 tablet (90 mg total) by mouth 2 (two) times daily.  60 tablet  0  . vitamin C (ASCORBIC ACID) 500 MG tablet Take 500 mg by mouth daily.       No current facility-administered medications for this visit.    Allergies:    Allergies  Allergen  Reactions  . Diphenhydramine Hcl     REACTION: TACHYCARDIA    Social History:  The patient  reports that she has never smoked. She has never used smokeless tobacco. She reports that  drinks alcohol. She reports that she does not use illicit drugs.   ROS:  Please see the history of present illness.      All other systems reviewed and negative.   PHYSICAL EXAM: VS:  BP 135/59  Pulse 70  Ht 5\' 4"  (1.626 m)  Wt 153 lb 6.4 oz (69.582 kg)  BMI 26.32 kg/m2 Well nourished, well developed, in no acute distress HEENT: normal Neck: no JVD Vascular: No carotid bruits Cardiac:  normal S1, S2; RRR; no murmur Lungs:  clear to auscultation bilaterally, no wheezing, rhonchi or rales Abd: soft, nontender, no hepatomegaly Ext: no edema; right wrist without hematoma or mass  Skin: warm and dry Neuro:  CNs 2-12 intact, no focal abnormalities noted  EKG:  NSR, HR 70, inf Q waves, AS Q waves, lateral TWI, no change from prior tracings.      ASSESSMENT AND PLAN:  1. CAD:  Doing well after recent DES to the LAD for [place].  Med Rx planned for CTO of the CFX.  We discussed the importance of dual antiplatelet therapy. She has been intolerant to statins and recent stopped Pravastatin due to myalgias.  I will refer her to Cardiac Rehab at [facility].   2. Ischemic CM:  Volume stable.  She is NYHA Class II.  Continue Coreg.  Increase Lisinopril to 5 mg bid.  Consider adding Spironolactone as BP will allow.  Check BMET in 1-2 weeks. Plan repeat Echo in 06/2013.  If EF remains < 35%, she will need consideration for referral to EP for possible ICD.  3. Hypertension:  Controlled.  Adjust medications as noted.  4. Hyperlipidemia:  She is willing to try Crestor 5 mg on MWF only.  Will provide samples.  Check Lipids and LFTs in 6 weeks.   5. Disposition:  F/u with Dr. Peter Swaziland next month as planned.   Signed, Tereso Newcomer, PA-C  04/01/2013 11:34 AM

## 2013-04-01 NOTE — Patient Instructions (Addendum)
START CRESTOR 5 MG ON MON. WED, AND FRI'S; SAMPLES GIVEN TODAY  INCREASE LISINOPRIL 5 MG TWICE DAILY; RX SENT TO [facility] TODAY  LAB IN 1-2 WEEKS  YOU WILL NEED TO HAVE AN ECHO AFTER 06/15/13 PER SCOTT WEAVER, PAC  You have been referred to CARDIAC REHAB AT [facility]  YOU HAVE A FOLLOW UP WITH DR. Swaziland WITH FLP/LFT SAME DAY

## 2013-04-06 ENCOUNTER — Other Ambulatory Visit: Payer: Self-pay

## 2013-04-06 MED ORDER — CARVEDILOL 6.25 MG PO TABS: 6.2500 mg | ORAL_TABLET | Freq: Two times a day (BID) | ORAL | Status: DC

## 2013-04-06 MED ORDER — TICAGRELOR 90 MG PO TABS: 90.0000 mg | ORAL_TABLET | Freq: Two times a day (BID) | ORAL | Status: DC

## 2013-05-05 ENCOUNTER — Other Ambulatory Visit: Payer: Medicare Other

## 2013-05-05 ENCOUNTER — Ambulatory Visit (INDEPENDENT_AMBULATORY_CARE_PROVIDER_SITE_OTHER): Payer: Medicare Other | Admitting: Cardiology

## 2013-05-05 ENCOUNTER — Encounter: Payer: Self-pay | Admitting: Cardiology

## 2013-05-05 VITALS — BP 140/68 | HR 44 | Ht 64.0 in | Wt 148.8 lb

## 2013-05-05 DIAGNOSIS — E785 Hyperlipidemia, unspecified: Secondary | ICD-10-CM

## 2013-05-05 DIAGNOSIS — I2589 Other forms of chronic ischemic heart disease: Secondary | ICD-10-CM

## 2013-05-05 DIAGNOSIS — I255 Ischemic cardiomyopathy: Secondary | ICD-10-CM

## 2013-05-05 DIAGNOSIS — I2510 Atherosclerotic heart disease of native coronary artery without angina pectoris: Secondary | ICD-10-CM

## 2013-05-05 LAB — HEPATIC FUNCTION PANEL
ALT: 24 U/L (ref 0–35)
Total Bilirubin: 0.7 mg/dL (ref 0.3–1.2)
Total Protein: 7.1 g/dL (ref 6.0–8.3)

## 2013-05-05 LAB — LIPID PANEL
HDL: 43.1 mg/dL (ref >39.00)
Triglycerides: 223 mg/dL — ABNORMAL HIGH (ref 0.0–149.0)
VLDL: 44.6 mg/dL — ABNORMAL HIGH (ref 0.0–40.0)

## 2013-05-05 LAB — LDL CHOLESTEROL, DIRECT: Direct LDL: 115.8 mg/dL

## 2013-05-05 MED ORDER — CLOPIDOGREL BISULFATE 75 MG PO TABS: 75.0000 mg | ORAL_TABLET | Freq: Every day | ORAL | Status: DC

## 2013-05-05 MED ORDER — SIMVASTATIN 10 MG PO TABS: 5.0000 mg | ORAL_TABLET | Freq: Every day | ORAL | Status: DC

## 2013-05-05 NOTE — Patient Instructions (Addendum)
We will try simvastatin 5 mg daily instead of Crestor.   We will switch Brilinta to Plavix. Take 300 mg of Plavix the first day then 75 mg daily. You should stop omeprazole.   I will see you in 6 months.

## 2013-05-06 NOTE — Progress Notes (Signed)
Veronica Barron Date of Birth: August 20, 1940 Medical Record #[id number]  History of Present Illness: Veronica Barron is seen today for followup. She has a history of hypertension, hyperlipidemia, and hypothyroidism. Recently she had a high risk nuclear stress test. She was admitted to the hospital and cardiac catheterization was performed. This demonstrated a complex 99% proximal LAD stenosis. Left circumflex was occluded with left to left collaterals and right-to-left collaterals. The right coronary was without significant disease. Ejection fraction was 30%. She had successful stenting of the proximal LAD with a drug-eluting stent. Her other disease was treated medically. On followup today she states she is doing very well. He feels remarkably improved. She denies any recurrent chest pain or shortness of breath. She is going to the cardiac rehabilitation program at [facility]. She works in her yard regularly. She was previously switched from Pravachol to Crestor for myalgias. However the myalgias have recurred. She had a similar reaction to Lipitor. She has heard from a number of her friends that simvastatin may work better. She does note that she is paying $60 a month for Brilinta.  Current Outpatient Prescriptions on File Prior to Visit  Medication Sig Dispense Refill  . aspirin EC 81 MG tablet Take 81 mg by mouth daily.      . calcium carbonate (OS-CAL) 600 MG TABS Take 600 mg by mouth daily.      . carvedilol (COREG) 6.25 MG tablet Take 1 tablet (6.25 mg total) by mouth 2 (two) times daily with a meal.  60 tablet  6  . clorazepate (TRANXENE-T) 7.5 MG tablet Take 1 tablet (7.5 mg total) by mouth at bedtime as needed for anxiety.  30 tablet  3  . Cyanocobalamin (VITAMIN B 12 PO) Take 1 capsule by mouth daily.      Marland Kitchen levothyroxine (SYNTHROID, LEVOTHROID) 100 MCG tablet Take 1 tablet (100 mcg total) by mouth daily.  90 tablet  3  . lisinopril (PRINIVIL,ZESTRIL) 5 MG tablet Take 1 tablet (5  mg total) by mouth 2 (two) times daily.  180 tablet  3  . multivitamin-lutein (OCUVITE-LUTEIN) CAPS capsule Take 1 capsule by mouth daily.      . nitroGLYCERIN (NITROSTAT) 0.4 MG SL tablet Place 1 tablet (0.4 mg total) under the tongue every 5 (five) minutes as needed for chest pain.  50 tablet  3  . sertraline (ZOLOFT) 100 MG tablet Take 0.5 tablets (50 mg total) by mouth daily.  90 tablet  3  . Ticagrelor (BRILINTA) 90 MG TABS tablet Take 1 tablet (90 mg total) by mouth 2 (two) times daily.  60 tablet  6  . vitamin C (ASCORBIC ACID) 500 MG tablet Take 500 mg by mouth daily.       No current facility-administered medications on file prior to visit.    Allergies  Allergen Reactions  . Diphenhydramine Hcl     REACTION: TACHYCARDIA    Past Medical History  Diagnosis Date  . Leiomyoma of uterus, unspecified   . Unspecified hypothyroidism   . Hypertension   . Hyperlipidemia   . Allergic rhinitis, cause unspecified   . Diverticulosis of colon   . CAD (coronary artery disease)     a. 03/2013 Abnl MV, EF 29%;  b. 03/2013 Cath/PCI: >M nl, LAD 99p (2.5x28 Promus Prem DES), LCX 100 after large OM1, RCA dominant, 20p, EF 30%.  . Ischemic cardiomyopathy     a. 03/2013 EF 30% by LV gram.    Past Surgical History  Procedure  Laterality Date  . Abdominal hysterectomy    . Tubal ligation    . Cholecystectomy      History  Smoking status  . Never Smoker   Smokeless tobacco  . Never Used    History  Alcohol Use  . Yes    Comment: wine    Family History  Problem Relation Age of Onset  . Dementia Mother   . Hyperlipidemia Mother   . Hypertension Mother   . Coronary artery disease Father   . Hypertension Father   . Hyperlipidemia Father     Review of Systems: As noted in history of present illness.  All other systems were reviewed and are negative.  Physical Exam: BP 140/68  Pulse 44  Ht 5\' 4"  (1.626 m)  Wt 148 lb 12.8 oz (67.495 kg)  BMI 25.53 kg/m2  SpO2 99% She is a  pleasant, white female in no acute distress. HEENT: Normal Neck: No adenopathy, JVD, bruits, or thyromegaly. Lungs: Clear Cardiac: Regular rate and rhythm. No gallop or murmur. Abdomen: Soft and nontender. No hepatosplenomegaly. Bowel sounds are positive. Extremities: No cyanosis or edema. Pulses are 2+. Skin: Warm and dry Neurological alert and oriented x3. Cranial nerves II through XII are intact.  LABORATORY DATA: Lab Results  Component Value Date   WBC 8.6 03/17/2013   HGB 13.6 03/17/2013   HCT 39.2 03/17/2013   PLT 264 03/17/2013   GLUCOSE 127* 03/24/2013   CHOL 189 05/05/2013   TRIG 223.0* 05/05/2013   HDL 43.10 05/05/2013   LDLDIRECT 115.8 05/05/2013   LDLCALC 134* 03/16/2013   ALT 24 05/05/2013   AST 22 05/05/2013   NA 134* 03/24/2013   K 3.9 03/24/2013   CL 104 03/24/2013   CREATININE 0.8 03/24/2013   BUN 13 03/24/2013   CO2 23 03/24/2013   TSH 2.208 03/15/2013   INR 0.94 03/16/2013     Assessment / Plan: 1. Coronary disease status post stenting of the proximal LAD in September 2014 with drug-eluting stent. 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 Given her cost concerns we will switch her from Brilinta to Plavix. We will load her with 300 mg and then continue 75 mg daily. We will continue her other antianginal therapy and rehabilitation program.  2. Hyperlipidemia. History of intolerance to Pravachol, Crestor, Lipitor due to myalgias. She would like to try simvastatin. We will start her at 5 mg daily. If she is unable to tolerate this dose we may have her take it just every third day.  3. Hypertension-controlled.   4. Hypothyroidism.

## 2013-05-09 ENCOUNTER — Telehealth: Payer: Self-pay | Admitting: Cardiology

## 2013-05-09 ENCOUNTER — Telehealth: Payer: Self-pay | Admitting: *Deleted

## 2013-05-09 NOTE — Telephone Encounter (Signed)
lmptcb go lab results; pt saw Dr. [place] 10/23 and was switched from crestor to simvastatin 5 mg daily.

## 2013-05-09 NOTE — Telephone Encounter (Signed)
pt notifed about lab results and said she will try the simvastatin for at least 2 weeks and if doing ok then w/cb and schedule for fasting lipid and liver to be done in 3 months.

## 2013-05-09 NOTE — Telephone Encounter (Signed)
New  Patient returning carol's call. Please call back.

## 2013-05-16 ENCOUNTER — Other Ambulatory Visit: Payer: Self-pay

## 2013-05-16 DIAGNOSIS — I255 Ischemic cardiomyopathy: Secondary | ICD-10-CM

## 2013-05-16 DIAGNOSIS — I2510 Atherosclerotic heart disease of native coronary artery without angina pectoris: Secondary | ICD-10-CM

## 2013-05-16 DIAGNOSIS — E785 Hyperlipidemia, unspecified: Secondary | ICD-10-CM

## 2013-05-16 MED ORDER — CLOPIDOGREL BISULFATE 75 MG PO TABS: 75.0000 mg | ORAL_TABLET | Freq: Every day | ORAL | Status: DC

## 2013-05-16 MED ORDER — SIMVASTATIN 10 MG PO TABS: 5.0000 mg | ORAL_TABLET | Freq: Every day | ORAL | Status: DC

## 2013-05-26 NOTE — Telephone Encounter (Signed)
Received RX clarification from [facility] pharmacy for possible interaction with plavix and omeprazole.Dr.Jordan advise to stop omeprazole and start protonix 40 mg daily.Form faxed back to fax # [phone].

## 2013-06-17 ENCOUNTER — Encounter: Payer: Self-pay | Admitting: Cardiology

## 2013-06-17 ENCOUNTER — Encounter (INDEPENDENT_AMBULATORY_CARE_PROVIDER_SITE_OTHER): Payer: Self-pay

## 2013-06-17 ENCOUNTER — Ambulatory Visit (HOSPITAL_COMMUNITY): Payer: Medicare Other | Attending: Physician Assistant | Admitting: Radiology

## 2013-06-17 DIAGNOSIS — I10 Essential (primary) hypertension: Secondary | ICD-10-CM | POA: Insufficient documentation

## 2013-06-17 DIAGNOSIS — E039 Hypothyroidism, unspecified: Secondary | ICD-10-CM | POA: Insufficient documentation

## 2013-06-17 DIAGNOSIS — I255 Ischemic cardiomyopathy: Secondary | ICD-10-CM

## 2013-06-17 DIAGNOSIS — I2510 Atherosclerotic heart disease of native coronary artery without angina pectoris: Secondary | ICD-10-CM | POA: Insufficient documentation

## 2013-06-17 DIAGNOSIS — I079 Rheumatic tricuspid valve disease, unspecified: Secondary | ICD-10-CM | POA: Insufficient documentation

## 2013-06-17 DIAGNOSIS — I2589 Other forms of chronic ischemic heart disease: Secondary | ICD-10-CM | POA: Insufficient documentation

## 2013-06-17 DIAGNOSIS — E785 Hyperlipidemia, unspecified: Secondary | ICD-10-CM | POA: Insufficient documentation

## 2013-06-17 DIAGNOSIS — I059 Rheumatic mitral valve disease, unspecified: Secondary | ICD-10-CM | POA: Insufficient documentation

## 2013-06-17 NOTE — Progress Notes (Signed)
Echocardiogram performed.  

## 2013-06-20 ENCOUNTER — Encounter: Payer: Self-pay | Admitting: Physician Assistant

## 2013-07-05 IMAGING — MG MM DIGITAL SCREENING BILAT
5 series · 5 of 5 positions shown · non-contrast
Comparison: Prior studies.

DG SCREEN MAMMOGRAM BILATERAL
Bilateral CC and MLO view(s) were taken.
Technologist: Jandry Javier Lovera, RT, RM
Prior study comparison: July 25, 2008, DG screen mammogram bilateral.

DIGITAL SCREENING MAMMOGRAM WITH CAD:

[R CC]
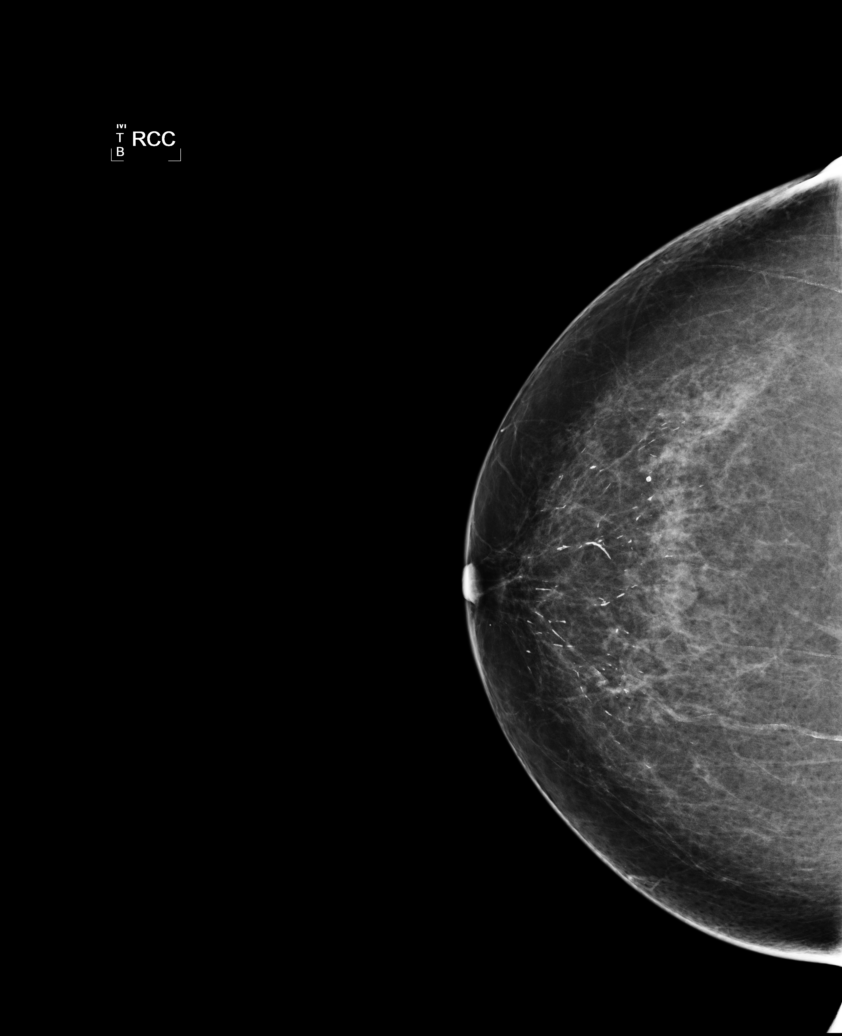

[R MLO]
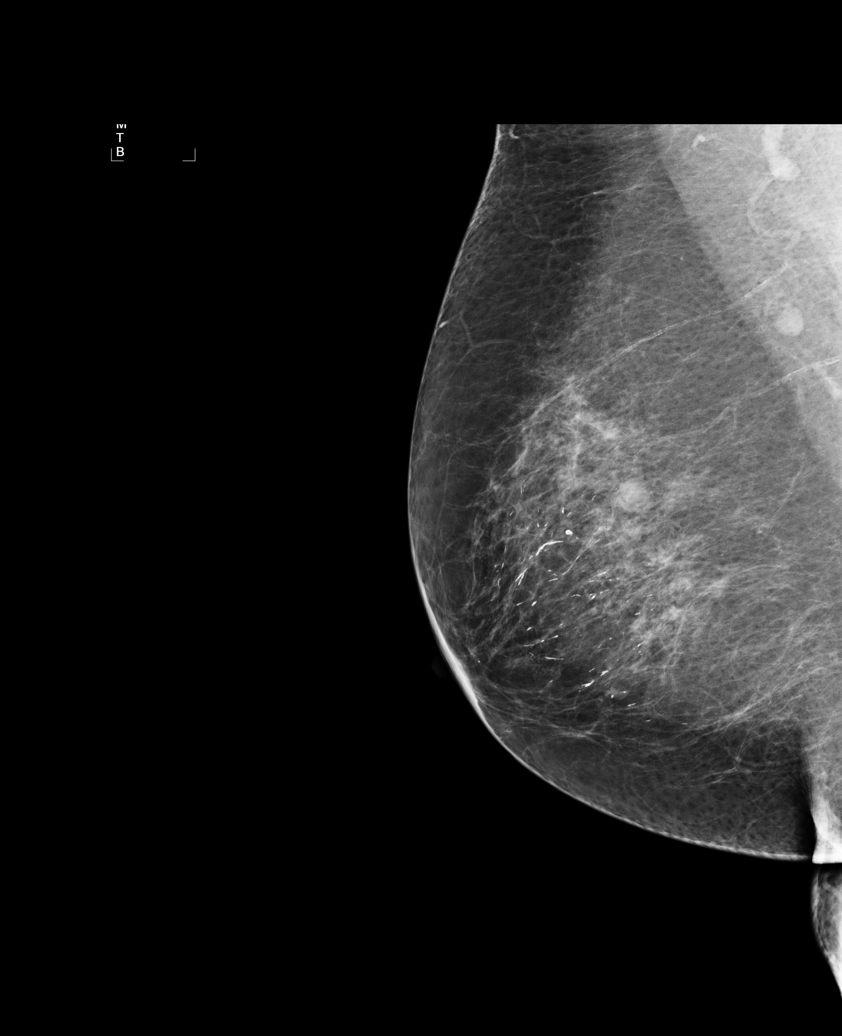

[L CC]
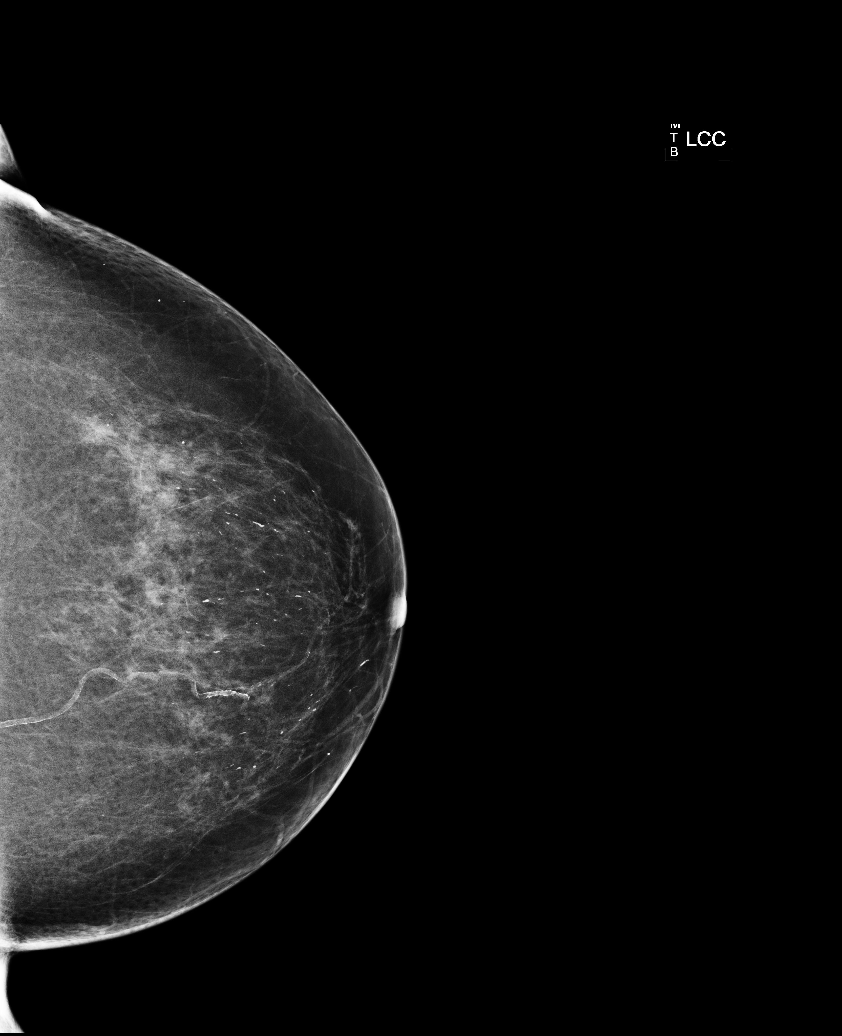

[L MLO (1 of 2)]
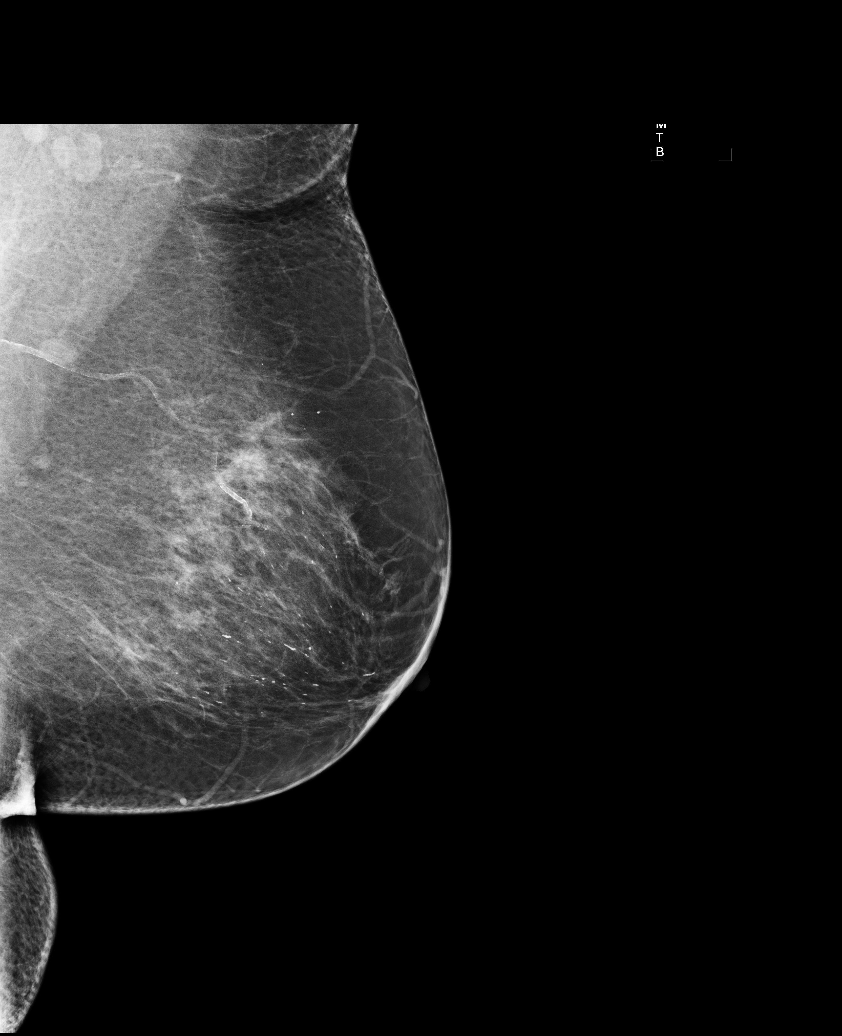

[L MLO (2 of 2)]
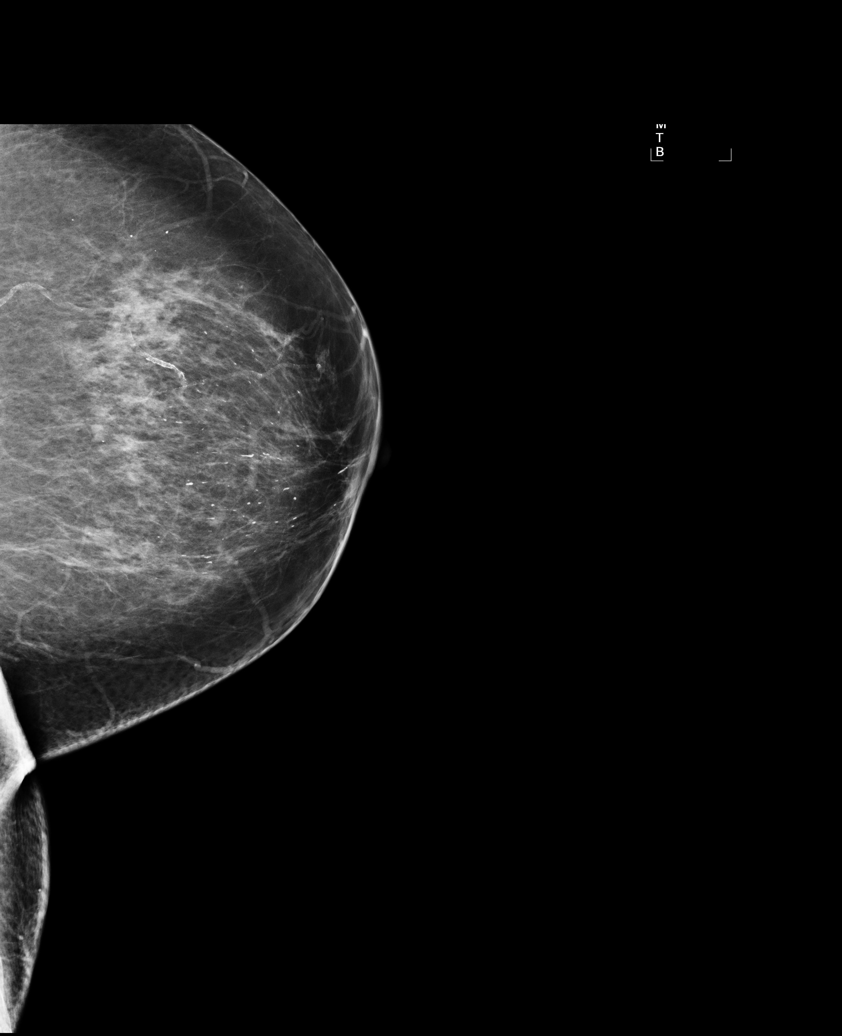

[5 of 5 positions shown; findings below may reference images not displayed]

There are scattered fibroglandular densities.  There is no dominant mass, architectural distortion 
or calcification to suggest malignancy.

Images were processed with CAD.
IMPRESSION: No mammographic evidence of malignancy.  Suggest yearly screening mammography.

A result letter of this screening mammogram will be mailed directly to the patient.

ASSESSMENT: Negative - BI-RADS 1

Screening mammogram in 1 year.
,

## 2013-07-15 LAB — HM MAMMOGRAPHY: HM Mammogram: NORMAL

## 2013-08-26 ENCOUNTER — Other Ambulatory Visit: Payer: Self-pay | Admitting: Internal Medicine

## 2013-08-26 DIAGNOSIS — Z1231 Encounter for screening mammogram for malignant neoplasm of breast: Secondary | ICD-10-CM

## 2013-09-05 ENCOUNTER — Ambulatory Visit (HOSPITAL_COMMUNITY)
Admission: RE | Admit: 2013-09-05 | Discharge: 2013-09-05 | Disposition: A | Discharge status: Home / Self Care | Payer: Medicare Other | Source: Ambulatory Visit | Attending: Internal Medicine | Admitting: Internal Medicine

## 2013-09-05 DIAGNOSIS — Z1231 Encounter for screening mammogram for malignant neoplasm of breast: Secondary | ICD-10-CM | POA: Insufficient documentation

## 2013-11-02 ENCOUNTER — Ambulatory Visit (INDEPENDENT_AMBULATORY_CARE_PROVIDER_SITE_OTHER): Payer: Medicare Other | Admitting: Cardiology

## 2013-11-02 ENCOUNTER — Encounter: Payer: Self-pay | Admitting: Cardiology

## 2013-11-02 VITALS — BP 162/84 | HR 80 | Ht 64.0 in | Wt 144.8 lb

## 2013-11-02 DIAGNOSIS — E785 Hyperlipidemia, unspecified: Secondary | ICD-10-CM

## 2013-11-02 DIAGNOSIS — I2510 Atherosclerotic heart disease of native coronary artery without angina pectoris: Secondary | ICD-10-CM

## 2013-11-02 DIAGNOSIS — I10 Essential (primary) hypertension: Secondary | ICD-10-CM

## 2013-11-02 DIAGNOSIS — I255 Ischemic cardiomyopathy: Secondary | ICD-10-CM

## 2013-11-02 DIAGNOSIS — I2589 Other forms of chronic ischemic heart disease: Secondary | ICD-10-CM

## 2013-11-02 MED ORDER — CLOPIDOGREL BISULFATE 75 MG PO TABS: 75.0000 mg | ORAL_TABLET | Freq: Every day | ORAL | Status: DC

## 2013-11-02 MED ORDER — CARVEDILOL 6.25 MG PO TABS: 6.2500 mg | ORAL_TABLET | Freq: Two times a day (BID) | ORAL | Status: DC

## 2013-11-02 NOTE — Progress Notes (Signed)
[name] Date of Birth: Feb 05, 1941 Medical Record #[id number]  History of Present Illness: Mrs. Veronica Barron is seen today for followup. She has a history of hypertension, hyperlipidemia, and hypothyroidism. In Sept 2014 she had stenting of the proximal LAD with DES.  Left circumflex was occluded with left to left collaterals and right-to-left collaterals. The right coronary was without significant disease. Ejection fraction was 30%.  Her other disease was treated medically. On followup Echo in Dec. 2014 EF had improved to 50-55%.  She feels very well today. She has a history of intolerance to multiple statins and most recently could not tolerate 5 mg of simvastatin. BP at home has been in the 130s/ 70. No chest pain or SOB. She is walking regularly.  Current Outpatient Prescriptions on File Prior to Visit  Medication Sig Dispense Refill  . aspirin EC 81 MG tablet Take 81 mg by mouth daily.      . calcium carbonate (OS-CAL) 600 MG TABS Take 600 mg by mouth daily.      . clorazepate (TRANXENE-T) 7.5 MG tablet Take 1 tablet (7.5 mg total) by mouth at bedtime as needed for anxiety.  30 tablet  3  . Cyanocobalamin (VITAMIN B 12 PO) Take 1 capsule by mouth daily.      Marland Kitchen levothyroxine (SYNTHROID, LEVOTHROID) 100 MCG tablet Take 1 tablet (100 mcg total) by mouth daily.  90 tablet  3  . lisinopril (PRINIVIL,ZESTRIL) 5 MG tablet Take 1 tablet (5 mg total) by mouth 2 (two) times daily.  180 tablet  3  . multivitamin-lutein (OCUVITE-LUTEIN) CAPS capsule Take 1 capsule by mouth daily.      . nitroGLYCERIN (NITROSTAT) 0.4 MG SL tablet Place 1 tablet (0.4 mg total) under the tongue every 5 (five) minutes as needed for chest pain.  50 tablet  3  . pantoprazole (PROTONIX) 40 MG tablet Take 40 mg daily if needed  90 tablet  3  . sertraline (ZOLOFT) 100 MG tablet Take 0.5 tablets (50 mg total) by mouth daily.  90 tablet  3  . vitamin C (ASCORBIC ACID) 500 MG tablet Take 500 mg by mouth daily.       No current  facility-administered medications on file prior to visit.    Allergies  Allergen Reactions  . Diphenhydramine Hcl     REACTION: TACHYCARDIA    Past Medical History  Diagnosis Date  . Leiomyoma of uterus, unspecified   . Unspecified hypothyroidism   . Hypertension   . Hyperlipidemia   . Allergic rhinitis, cause unspecified   . Diverticulosis of colon   . CAD (coronary artery disease)     a. 03/2013 Abnl MV, EF 29%;  b. 03/2013 Cath/PCI: >M nl, LAD 99p (2.5x28 Promus Prem DES), LCX 100 after large OM1, RCA dominant, 20p, EF 30%.  . Ischemic cardiomyopathy     a. 03/2013 EF 30% by LV gram.;  b. Echo (12/14): Mod LVH with severe basal septal hypertrophy w/o LVOT obstr, EF 50-55%, normal wall motion, Gr 1 DD, MAC, mild MR, mild LAE, trivial eff.    Past Surgical History  Procedure Laterality Date  . Abdominal hysterectomy    . Tubal ligation    . Cholecystectomy      History  Smoking status  . Never Smoker   Smokeless tobacco  . Never Used    History  Alcohol Use  . Yes    Comment: wine    Family History  Problem Relation Age of Onset  . Dementia  Mother   . Hyperlipidemia Mother   . Hypertension Mother   . Coronary artery disease Father   . Hypertension Father   . Hyperlipidemia Father     Review of Systems: As noted in history of present illness.  All other systems were reviewed and are negative.  Physical Exam: BP 162/84  Pulse 80  Ht 5\' 4"  (1.626 m)  Wt 144 lb 12.8 oz (65.681 kg)  BMI 24.84 kg/m2 She is a pleasant, white female in no acute distress. HEENT: Normal Neck: No adenopathy, JVD, bruits, or thyromegaly. Lungs: Clear Cardiac: Regular rate and rhythm. No gallop or murmur. Abdomen: Soft and nontender. No hepatosplenomegaly. Bowel sounds are positive. Extremities: No cyanosis or edema. Pulses are 2+. Skin: Warm and dry Neurological alert and oriented x3. Cranial nerves II through XII are intact.  LABORATORY DATA: Lab Results  Component Value  Date   WBC 8.6 03/17/2013   HGB 13.6 03/17/2013   HCT 39.2 03/17/2013   PLT 264 03/17/2013   GLUCOSE 127* 03/24/2013   CHOL 189 05/05/2013   TRIG 223.0* 05/05/2013   HDL 43.10 05/05/2013   LDLDIRECT 115.8 05/05/2013   LDLCALC 134* 03/16/2013   ALT 24 05/05/2013   AST 22 05/05/2013   NA 134* 03/24/2013   K 3.9 03/24/2013   CL 104 03/24/2013   CREATININE 0.8 03/24/2013   BUN 13 03/24/2013   CO2 23 03/24/2013   TSH 2.208 03/15/2013   INR 0.94 03/16/2013   Echo:Study Conclusions  - Left ventricle: There is moderate left ventricular hypertrophy with severe basal septal hypertrophy without LVOT obstruction. The cavity size was normal. There was moderate concentric hypertrophy. Systolic function was normal. The estimated ejection fraction was in the range of 50% to 55%. Wall motion was normal; there were no regional wall motion abnormalities. Doppler parameters are consistent with abnormal left ventricular relaxation (grade 1 diastolic dysfunction). - Mitral valve: Calcified annulus. Mildly thickened leaflets . Mild regurgitation. - Left atrium: The atrium was mildly dilated. - Atrial septum: No defect or patent foramen ovale was identified. - Pericardium, extracardiac: A trivial pericardial effusion was identified.    Assessment / Plan: 1. Coronary disease status post stenting of the proximal LAD in September 2014 with drug-eluting stent. 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 Continue DAPT with ASA and Plavix for one year. We will continue her other antianginal therapy.  2. Hyperlipidemia. History of intolerance to multiple statins. Work on dietary modifications. May be a candidate for PCSK9 inhibitor when available.   3. Hypertension-mildy elevated today but controlled at home. Continue Rx.   4. Hypothyroidism.

## 2013-11-02 NOTE — Patient Instructions (Signed)
Continue your current therapy  I will see you in 6 months.   

## 2014-03-15 ENCOUNTER — Ambulatory Visit (INDEPENDENT_AMBULATORY_CARE_PROVIDER_SITE_OTHER): Payer: Medicare Other | Admitting: Internal Medicine

## 2014-03-15 ENCOUNTER — Encounter: Payer: Self-pay | Admitting: Internal Medicine

## 2014-03-15 VITALS — BP 148/78 | HR 75 | Temp 97.8°F | Resp 16 | Ht 64.0 in | Wt 147.0 lb

## 2014-03-15 DIAGNOSIS — I2510 Atherosclerotic heart disease of native coronary artery without angina pectoris: Secondary | ICD-10-CM

## 2014-03-15 DIAGNOSIS — Z23 Encounter for immunization: Secondary | ICD-10-CM

## 2014-03-15 DIAGNOSIS — E039 Hypothyroidism, unspecified: Secondary | ICD-10-CM

## 2014-03-15 DIAGNOSIS — I255 Ischemic cardiomyopathy: Secondary | ICD-10-CM

## 2014-03-15 DIAGNOSIS — T466X5A Adverse effect of antihyperlipidemic and antiarteriosclerotic drugs, initial encounter: Secondary | ICD-10-CM | POA: Insufficient documentation

## 2014-03-15 DIAGNOSIS — I10 Essential (primary) hypertension: Secondary | ICD-10-CM

## 2014-03-15 DIAGNOSIS — R5381 Other malaise: Secondary | ICD-10-CM

## 2014-03-15 DIAGNOSIS — E785 Hyperlipidemia, unspecified: Secondary | ICD-10-CM

## 2014-03-15 DIAGNOSIS — Z0000 Encounter for general adult medical examination without abnormal findings: Secondary | ICD-10-CM

## 2014-03-15 DIAGNOSIS — Z789 Other specified health status: Secondary | ICD-10-CM | POA: Insufficient documentation

## 2014-03-15 DIAGNOSIS — I2589 Other forms of chronic ischemic heart disease: Secondary | ICD-10-CM

## 2014-03-15 DIAGNOSIS — Z888 Allergy status to other drugs, medicaments and biological substances status: Secondary | ICD-10-CM

## 2014-03-15 DIAGNOSIS — R5383 Other fatigue: Secondary | ICD-10-CM

## 2014-03-15 DIAGNOSIS — E559 Vitamin D deficiency, unspecified: Secondary | ICD-10-CM

## 2014-03-15 DIAGNOSIS — M791 Myalgia, unspecified site: Secondary | ICD-10-CM | POA: Insufficient documentation

## 2014-03-15 MED ORDER — LEVOTHYROXINE SODIUM 100 MCG PO TABS: 100.0000 ug | ORAL_TABLET | Freq: Every day | ORAL | Status: DC

## 2014-03-15 MED ORDER — SERTRALINE HCL 100 MG PO TABS: 50.0000 mg | ORAL_TABLET | Freq: Every day | ORAL | Status: DC

## 2014-03-15 MED ORDER — CLORAZEPATE DIPOTASSIUM 7.5 MG PO TABS
7.5000 mg | ORAL_TABLET | Freq: Every evening | ORAL | Status: DC | PRN
Start: 2014-03-15 — End: 2014-08-09

## 2014-03-15 MED ORDER — TETANUS-DIPHTH-ACELL PERTUSSIS 5-2.5-18.5 LF-MCG/0.5 IM SUSP: 0.5000 mL | Freq: Once | INTRAMUSCULAR | Status: DC

## 2014-03-15 NOTE — Progress Notes (Signed)
Pre-visit discussion using our clinic review tool. No additional management support is needed unless otherwise documented below in the visit note.  

## 2014-03-15 NOTE — Progress Notes (Signed)
Patient ID: Veronica Barron, female   DOB: [birth year], 73 y.o.   MRN: [id number]  The patient is here for annual Medicare wellness examination and management of other chronic and acute problems.  She is a former patient of Dr Linda Hedges,  Last seen by him for her wellness  exam on March 11 2013.  At that time she was referred to cardiology for symptoms of exertional angina and found to have LAD Occlusion and EF 29% by cardiac catheterization.  She is s/p DES and her EF  Has normalized as of  December 2014 ECHO.  Her CAD is mananged by cardiology with twice annual visits.   Occasional insomnia,  Better when she is more active in her garden and when working outside.  She uses tranzene prn and needs refill  She has No history of falls.  She walks daily using a treadmill.  She completed the cardiopulmonary rehab program after her stent was placed.   No memory issues or hearing issues.   Drinks wine occasionally ,  Inquired about daily usenever smoker     The risk factors are reflected in the social history.  The roster of all physicians providing medical care to patient - is listed in the Snapshot section of the chart.  Activities of daily living:  The patient is 100% independent in all ADLs: dressing, toileting, feeding as well as independent mobility  Home safety : The patient has smoke detectors in the home. They wear seatbelts.  There are no firearms at home. There is no violence in the home.   There is no risks for hepatitis, STDs or HIV. There is no   history of blood transfusion. They have no travel history to infectious disease endemic areas of the world.  The patient has seen their dentist in the last six month. They have seen their eye doctor in the last year. They admit to slight hearing difficulty with regard to whispered voices and some television programs.  They have deferred audiologic testing in the last year.  They do not  have excessive sun exposure. Discussed the need for sun  protection: hats, long sleeves and use of sunscreen if there is significant sun exposure.   Diet: the importance of a healthy diet is discussed. They do have a healthy diet.  The benefits of regular aerobic exercise were discussed. She walks 4 times per week ,  20 minutes.   Depression screen: there are no signs or vegative symptoms of depression- irritability, change in appetite, anhedonia, sadness/tearfullness.  Cognitive assessment: the patient manages all their financial and personal affairs and is actively engaged. They could relate day,date,year and events; recalled 2/3 objects at 3 minutes; performed clock-face test normally.  The following portions of the patient's history were reviewed and updated as appropriate: allergies, current medications, past family history, past medical history,  past surgical history, past social history  and problem list.  Visual acuity was not assessed per patient preference since she has regular follow up with her ophthalmologist. Hearing and body mass index were assessed and reviewed.   During the course of the visit the patient was educated and counseled about appropriate screening and preventive services including : fall prevention , diabetes screening, nutrition counseling, colorectal cancer screening, and recommended immunizations.    Objective:  General appearance: alert, cooperative and appears stated age Head: Normocephalic, without obvious abnormality, atraumatic Eyes: conjunctivae/corneas clear. PERRL, EOM's intact. Fundi benign. Ears: normal TM's and external ear canals both ears Nose: Nares normal. Septum  midline. Mucosa normal. No drainage or sinus tenderness. Throat: lips, mucosa, and tongue normal; teeth and gums normal Neck: no adenopathy, no carotid bruit, no JVD, supple, symmetrical, trachea midline and thyroid not enlarged, symmetric, no tenderness/mass/nodules Lungs: clear to auscultation bilaterally Breasts: normal appearance, no  masses or tenderness Heart: regular rate and rhythm, S1, S2 normal, no murmur, click, rub or gallop Abdomen: soft, non-tender; bowel sounds normal; no masses,  no organomegaly Extremities: extremities normal, atraumatic, no cyanosis or edema Pulses: 2+ and symmetric Skin: Skin color, texture, turgor normal. No rashes or lesions Neurologic: Alert and oriented X 3, normal strength and tone. Normal symmetric reflexes. Normal coordination and gait.   Assessment and Plan:  HYPERTENSION Well controlled on current regimen. Renal function is overdue ,  She will return for fasting labs.  Lab Results  Component Value Date   CREATININE 0.8 03/24/2013     Cardiomyopathy, ischemic EF returned to normal after coronary intervention per last ECHO  And she is walking regularly without symptoms of dyspnea or chest pain   CAD (coronary artery disease) She has been asymptomatic, is taking her medications including daily aspirin, plvix, for history of stent, ACE inhibitor, and  beta blocker without statin.  Continue semi annual follow up with cardiology.     HYPERLIPIDEMIA Mild, per last review. No prior trial of red yeast rice .Untreated due to statin intolerance. She will return for fasting labs.   Lab Results  Component Value Date   CHOL 189 05/05/2013   HDL 43.10 05/05/2013   LDLCALC 134* 03/16/2013   LDLDIRECT 115.8 05/05/2013   TRIG 223.0* 05/05/2013   CHOLHDL 4 05/05/2013     Statin intolerance No prior trial of RYR  Will review fasting labs and consider trial of therapy  A total of 50 minutes was spent with patient more than half of which was spent in counseling patient on the above mentioned issues , reviewing and explaining recent labs and imaging studies done, and coordination of care.   Updated Medication List Outpatient Encounter Prescriptions as of 03/15/2014  Medication Sig  . ALFALFA PO Take 1 tablet by mouth daily.  Marland Kitchen aspirin EC 81 MG tablet Take 81 mg by mouth daily.  .  calcium carbonate (OS-CAL) 600 MG TABS Take 600 mg by mouth daily.  . carvedilol (COREG) 6.25 MG tablet Take 1 tablet (6.25 mg total) by mouth 2 (two) times daily with a meal.  . clopidogrel (PLAVIX) 75 MG tablet Take 1 tablet (75 mg total) by mouth daily.  . clorazepate (TRANXENE-T) 7.5 MG tablet Take 1 tablet (7.5 mg total) by mouth at bedtime as needed for anxiety.  . Cyanocobalamin (VITAMIN B 12 PO) Take 1 capsule by mouth daily.  Marland Kitchen levothyroxine (SYNTHROID, LEVOTHROID) 100 MCG tablet Take 1 tablet (100 mcg total) by mouth daily.  Marland Kitchen lisinopril (PRINIVIL,ZESTRIL) 5 MG tablet Take 1 tablet (5 mg total) by mouth 2 (two) times daily.  . multivitamin-lutein (OCUVITE-LUTEIN) CAPS capsule Take 1 capsule by mouth daily.  . nitroGLYCERIN (NITROSTAT) 0.4 MG SL tablet Place 1 tablet (0.4 mg total) under the tongue every 5 (five) minutes as needed for chest pain.  Marland Kitchen sertraline (ZOLOFT) 100 MG tablet Take 0.5 tablets (50 mg total) by mouth daily.  . vitamin C (ASCORBIC ACID) 500 MG tablet Take 500 mg by mouth daily.  . [DISCONTINUED] clorazepate (TRANXENE-T) 7.5 MG tablet Take 1 tablet (7.5 mg total) by mouth at bedtime as needed for anxiety.  . [DISCONTINUED] levothyroxine (SYNTHROID, LEVOTHROID) 100  MCG tablet Take 1 tablet (100 mcg total) by mouth daily.  . [DISCONTINUED] sertraline (ZOLOFT) 100 MG tablet Take 0.5 tablets (50 mg total) by mouth daily.  . pantoprazole (PROTONIX) 40 MG tablet Take 40 mg daily if needed  . Tdap (BOOSTRIX) 5-2.5-18.5 LF-MCG/0.5 injection Inject 0.5 mLs into the muscle once.

## 2014-03-15 NOTE — Patient Instructions (Addendum)
You had your annual Medicare wellness exam today  We will schedule your mammogram  In January.   You need to have a TDaP vaccine .  I have given you prescriptions for thisbecause they will be cheaper at the health Dept or at your  local pharmacy because Medicare will not reimburse for them.   You received the pneumonia vaccine today.  Please return for fasting labs as soon as possible.  We will contact you with the bloodwork results   I recommend getting the majority of your calcium and Vitamin D  through diet rather than supplements given the recent association of calcium supplements with increased coronary artery calcium scores (You need 1200 mg daily )   Unsweetened almond/coconut milk is a great low calorie low carb, cholesterol free  way to increase your dietary calcium and vitamin D.  Try the blue Diamond  brand

## 2014-03-18 NOTE — Assessment & Plan Note (Signed)
No prior trial of RYR  Will review fasting labs and consider trial of therapy

## 2014-03-18 NOTE — Assessment & Plan Note (Signed)
Well controlled on current regimen. Renal function is overdue ,  She will return for fasting labs.  Lab Results  Component Value Date   CREATININE 0.8 03/24/2013

## 2014-03-18 NOTE — Assessment & Plan Note (Signed)
EF returned to normal after coronary intervention per last ECHO  And she is walking regularly without symptoms of dyspnea or chest pain

## 2014-03-18 NOTE — Assessment & Plan Note (Signed)
Mild, per last review. No prior trial of red yeast rice .Untreated due to statin intolerance. She will return for fasting labs.   Lab Results  Component Value Date   CHOL 189 05/05/2013   HDL 43.10 05/05/2013   LDLCALC 134* 03/16/2013   LDLDIRECT 115.8 05/05/2013   TRIG 223.0* 05/05/2013   CHOLHDL 4 05/05/2013

## 2014-03-18 NOTE — Assessment & Plan Note (Addendum)
She has been asymptomatic, is taking her medications including daily aspirin, plvix, for history of stent, ACE inhibitor, and  beta blocker without statin.  Continue semi annual follow up with cardiology.

## 2014-03-21 ENCOUNTER — Other Ambulatory Visit (INDEPENDENT_AMBULATORY_CARE_PROVIDER_SITE_OTHER): Payer: Medicare Other

## 2014-03-21 ENCOUNTER — Telehealth: Payer: Self-pay | Admitting: *Deleted

## 2014-03-21 DIAGNOSIS — I2510 Atherosclerotic heart disease of native coronary artery without angina pectoris: Secondary | ICD-10-CM

## 2014-03-21 DIAGNOSIS — I2589 Other forms of chronic ischemic heart disease: Secondary | ICD-10-CM

## 2014-03-21 DIAGNOSIS — E039 Hypothyroidism, unspecified: Secondary | ICD-10-CM

## 2014-03-21 DIAGNOSIS — I10 Essential (primary) hypertension: Secondary | ICD-10-CM

## 2014-03-21 DIAGNOSIS — E559 Vitamin D deficiency, unspecified: Secondary | ICD-10-CM

## 2014-03-21 DIAGNOSIS — I255 Ischemic cardiomyopathy: Secondary | ICD-10-CM

## 2014-03-21 DIAGNOSIS — E785 Hyperlipidemia, unspecified: Secondary | ICD-10-CM

## 2014-03-21 DIAGNOSIS — R5383 Other fatigue: Principal | ICD-10-CM

## 2014-03-21 DIAGNOSIS — R5381 Other malaise: Secondary | ICD-10-CM

## 2014-03-21 LAB — COMPREHENSIVE METABOLIC PANEL
ALK PHOS: 46 U/L (ref 39–117)
ALT: 14 U/L (ref 0–35)
AST: 22 U/L (ref 0–37)
Albumin: 3.8 g/dL (ref 3.5–5.2)
BILIRUBIN TOTAL: 0.6 mg/dL (ref 0.2–1.2)
BUN: 11 mg/dL (ref 6–23)
CO2: 27 meq/L (ref 19–32)
Calcium: 9 mg/dL (ref 8.4–10.5)
Chloride: 105 mEq/L (ref 96–112)
Creatinine, Ser: 0.8 mg/dL (ref 0.4–1.2)
GFR: 80.34 mL/min (ref >60.00)
Glucose, Bld: 105 mg/dL — ABNORMAL HIGH (ref 70–99)
Potassium: 4.1 mEq/L (ref 3.5–5.1)
Sodium: 139 mEq/L (ref 135–145)
TOTAL PROTEIN: 6.8 g/dL (ref 6.0–8.3)

## 2014-03-21 LAB — BASIC METABOLIC PANEL
BUN: 11 mg/dL (ref 6–23)
CALCIUM: 9 mg/dL (ref 8.4–10.5)
CO2: 27 mEq/L (ref 19–32)
CREATININE: 0.8 mg/dL (ref 0.4–1.2)
Chloride: 105 mEq/L (ref 96–112)
GFR: 80.34 mL/min (ref >60.00)
Glucose, Bld: 105 mg/dL — ABNORMAL HIGH (ref 70–99)
Potassium: 4.1 mEq/L (ref 3.5–5.1)
Sodium: 139 mEq/L (ref 135–145)

## 2014-03-21 LAB — CBC WITH DIFFERENTIAL/PLATELET
BASOS ABS: 0 10*3/uL (ref 0.0–0.1)
Basophils Relative: 0.5 % (ref 0.0–3.0)
Eosinophils Absolute: 0.2 10*3/uL (ref 0.0–0.7)
Eosinophils Relative: 2.8 % (ref 0.0–5.0)
HEMATOCRIT: 42.1 % (ref 36.0–46.0)
Hemoglobin: 14.2 g/dL (ref 12.0–15.0)
Lymphocytes Relative: 40.1 % (ref 12.0–46.0)
Lymphs Abs: 2.5 10*3/uL (ref 0.7–4.0)
MCHC: 33.7 g/dL (ref 30.0–36.0)
MCV: 85.9 fl (ref 78.0–100.0)
MONO ABS: 0.4 10*3/uL (ref 0.1–1.0)
MONOS PCT: 6 % (ref 3.0–12.0)
Neutro Abs: 3.1 10*3/uL (ref 1.4–7.7)
Neutrophils Relative %: 50.6 % (ref 43.0–77.0)
Platelets: 271 10*3/uL (ref 150.0–400.0)
RBC: 4.9 Mil/uL (ref 3.87–5.11)
RDW: 13.8 % (ref 11.5–15.5)
WBC: 6.2 10*3/uL (ref 4.0–10.5)

## 2014-03-21 LAB — LIPID PANEL
CHOL/HDL RATIO: 6
Cholesterol: 242 mg/dL — ABNORMAL HIGH (ref 0–200)
HDL: 40.5 mg/dL (ref >39.00)
NONHDL: 201.5
Triglycerides: 325 mg/dL — ABNORMAL HIGH (ref 0.0–149.0)
VLDL: 65 mg/dL — AB (ref 0.0–40.0)

## 2014-03-21 LAB — LDL CHOLESTEROL, DIRECT: LDL DIRECT: 161.6 mg/dL

## 2014-03-21 LAB — TSH: TSH: 0.14 u[IU]/mL — ABNORMAL LOW (ref 0.35–4.50)

## 2014-03-21 LAB — VITAMIN D 25 HYDROXY (VIT D DEFICIENCY, FRACTURES): VITD: 24.32 ng/mL — AB (ref 30.00–100.00)

## 2014-03-21 NOTE — Telephone Encounter (Signed)
pt notified about bmet results. Though [employer]. PA not sure why came to him since he did not order. I looked further into and we think maybe someone got mixed up, may have seen an old order that was never done, maybe however; pt aware of results.

## 2014-03-22 ENCOUNTER — Other Ambulatory Visit: Payer: Self-pay | Admitting: *Deleted

## 2014-03-22 ENCOUNTER — Encounter: Payer: Self-pay | Admitting: *Deleted

## 2014-03-22 DIAGNOSIS — E559 Vitamin D deficiency, unspecified: Secondary | ICD-10-CM | POA: Insufficient documentation

## 2014-03-22 MED ORDER — LEVOTHYROXINE SODIUM 88 MCG PO TABS: 88.0000 ug | ORAL_TABLET | Freq: Every day | ORAL | Status: DC

## 2014-03-22 MED ORDER — ERGOCALCIFEROL 1.25 MG (50000 UT) PO CAPS: 50000.0000 [IU] | ORAL_CAPSULE | ORAL | Status: DC

## 2014-03-22 NOTE — Assessment & Plan Note (Signed)
Thyroid function is overactive on current dose of 100 mcg daily .  I have sent a lower dose of levothyroxine to her pharmacy . Repeat TSH due in 6 weeks.

## 2014-03-22 NOTE — Addendum Note (Signed)
Addended by: [name] on: 03/22/2014 07:10 AM   Modules accepted: Orders

## 2014-03-24 ENCOUNTER — Telehealth: Payer: Self-pay | Admitting: *Deleted

## 2014-03-24 MED ORDER — SERTRALINE HCL 100 MG PO TABS: 50.0000 mg | ORAL_TABLET | Freq: Every day | ORAL | Status: DC

## 2014-03-24 NOTE — Telephone Encounter (Signed)
Phone call from [facility], pt is trying to fill her mail order Rx, however, there is another physician showing in her account as prescribing the sertraline for more than once daily, going back one year. Kerry Kass, states according to his NPI, associated with a neurologist in [place]. Spoke with pt, she has never seen this provider before. [employer] advised pt to call and clarify, sounds like there is an error with her account. Pt states she has been talking to her insurance. Requesting a refill to [facility] until this is straightened out. Rx sent to pharmacy by escript

## 2014-03-30 ENCOUNTER — Telehealth: Payer: Self-pay | Admitting: *Deleted

## 2014-03-30 MED ORDER — SERTRALINE HCL 100 MG PO TABS: 50.0000 mg | ORAL_TABLET | Freq: Every day | ORAL | Status: DC

## 2014-03-30 NOTE — Telephone Encounter (Signed)
Fax from [facility] requesting Sertraline 50mg , one tablet daily, #90.  Last refill on 9.11.15 #15.  Please advise refill.

## 2014-03-30 NOTE — Telephone Encounter (Signed)
setn electronically 

## 2014-04-04 ENCOUNTER — Ambulatory Visit (INDEPENDENT_AMBULATORY_CARE_PROVIDER_SITE_OTHER): Payer: Medicare Other

## 2014-04-04 ENCOUNTER — Other Ambulatory Visit: Payer: Self-pay | Admitting: *Deleted

## 2014-04-04 DIAGNOSIS — Z23 Encounter for immunization: Secondary | ICD-10-CM

## 2014-04-04 DIAGNOSIS — I255 Ischemic cardiomyopathy: Secondary | ICD-10-CM

## 2014-04-04 DIAGNOSIS — I10 Essential (primary) hypertension: Secondary | ICD-10-CM

## 2014-04-04 MED ORDER — LISINOPRIL 5 MG PO TABS: 5.0000 mg | ORAL_TABLET | Freq: Two times a day (BID) | ORAL | Status: DC

## 2014-05-04 ENCOUNTER — Encounter: Payer: Self-pay | Admitting: Cardiology

## 2014-05-04 ENCOUNTER — Ambulatory Visit (INDEPENDENT_AMBULATORY_CARE_PROVIDER_SITE_OTHER): Payer: Medicare Other | Admitting: Cardiology

## 2014-05-04 ENCOUNTER — Other Ambulatory Visit: Payer: Self-pay

## 2014-05-04 VITALS — BP 139/81 | HR 67 | Ht 64.0 in | Wt 147.1 lb

## 2014-05-04 DIAGNOSIS — R0600 Dyspnea, unspecified: Secondary | ICD-10-CM

## 2014-05-04 DIAGNOSIS — R0609 Other forms of dyspnea: Secondary | ICD-10-CM | POA: Insufficient documentation

## 2014-05-04 DIAGNOSIS — I10 Essential (primary) hypertension: Secondary | ICD-10-CM

## 2014-05-04 DIAGNOSIS — Z789 Other specified health status: Secondary | ICD-10-CM

## 2014-05-04 DIAGNOSIS — I2510 Atherosclerotic heart disease of native coronary artery without angina pectoris: Secondary | ICD-10-CM

## 2014-05-04 DIAGNOSIS — Z889 Allergy status to unspecified drugs, medicaments and biological substances status: Secondary | ICD-10-CM

## 2014-05-04 DIAGNOSIS — I255 Ischemic cardiomyopathy: Secondary | ICD-10-CM

## 2014-05-04 DIAGNOSIS — E785 Hyperlipidemia, unspecified: Secondary | ICD-10-CM

## 2014-05-04 MED ORDER — EZETIMIBE 10 MG PO TABS: 10.0000 mg | ORAL_TABLET | Freq: Every day | ORAL | Status: DC

## 2014-05-04 MED ORDER — LISINOPRIL 5 MG PO TABS: 5.0000 mg | ORAL_TABLET | Freq: Two times a day (BID) | ORAL | Status: DC

## 2014-05-04 MED ORDER — CLOPIDOGREL BISULFATE 75 MG PO TABS: 75.0000 mg | ORAL_TABLET | Freq: Every day | ORAL | Status: DC

## 2014-05-04 MED ORDER — CARVEDILOL 6.25 MG PO TABS: 6.2500 mg | ORAL_TABLET | Freq: Two times a day (BID) | ORAL | Status: DC

## 2014-05-04 NOTE — Patient Instructions (Signed)
We will schedule you for a stress nuclear study  We will try you on Zetia 10 mg daily for cholesterol.

## 2014-05-04 NOTE — Progress Notes (Signed)
Veronica Barron Date of Birth: 1940-07-30 Medical Record #[id number]  History of Present Illness: Veronica Barron is seen today for followup. She has a history of hypertension, hyperlipidemia, and hypothyroidism. In Sept 2014 she had stenting of the proximal LAD with DES.  The distal left circumflex was occluded with left to left collaterals and right-to-left collaterals. The right coronary was without significant disease. Ejection fraction was 30%.  Her other disease was treated medically. On followup Echo in Dec. 2014 EF had improved to 50-55%.  She has a history of intolerance to multiple statins and Lopid.  On follow up today she notes that she was recently at a flea market in [place]. Walking up hill she became acutely SOB. This lasted about 20 minutes but then it took several days to feel back to normal. No chest pain.   Current Outpatient Prescriptions on File Prior to Visit  Medication Sig Dispense Refill  . ALFALFA PO Take 1 tablet by mouth daily.      Marland Kitchen aspirin EC 81 MG tablet Take 81 mg by mouth daily.      . calcium carbonate (OS-CAL) 600 MG TABS Take 600 mg by mouth daily.      . clorazepate (TRANXENE-T) 7.5 MG tablet Take 1 tablet (7.5 mg total) by mouth at bedtime as needed for anxiety.  90 tablet  3  . Cyanocobalamin (VITAMIN B 12 PO) Take 1 capsule by mouth daily.      . ergocalciferol (DRISDOL) 50000 UNITS capsule Take 1 capsule (50,000 Units total) by mouth once a week.  4 capsule  0  . levothyroxine (SYNTHROID, LEVOTHROID) 88 MCG tablet Take 1 tablet (88 mcg total) by mouth daily.  90 tablet  0  . multivitamin-lutein (OCUVITE-LUTEIN) CAPS capsule Take 1 capsule by mouth daily.      . nitroGLYCERIN (NITROSTAT) 0.4 MG SL tablet Place 1 tablet (0.4 mg total) under the tongue every 5 (five) minutes as needed for chest pain.  50 tablet  3  . pantoprazole (PROTONIX) 40 MG tablet Take 40 mg daily if needed  90 tablet  3  . sertraline (ZOLOFT) 100 MG tablet Take 0.5 tablets (50 mg  total) by mouth daily.  90 tablet  1  . Tdap (BOOSTRIX) 5-2.5-18.5 LF-MCG/0.5 injection Inject 0.5 mLs into the muscle once.  0.5 mL  0  . vitamin C (ASCORBIC ACID) 500 MG tablet Take 500 mg by mouth daily.       No current facility-administered medications on file prior to visit.    Allergies  Allergen Reactions  . Diphenhydramine Hcl     REACTION: TACHYCARDIA  . Statins     Myalgia     Past Medical History  Diagnosis Date  . Leiomyoma of uterus, unspecified   . Unspecified hypothyroidism   . Hypertension   . Hyperlipidemia   . Allergic rhinitis, cause unspecified   . Diverticulosis of colon   . CAD (coronary artery disease)     a. 03/2013 Abnl MV, EF 29%;  b. 03/2013 Cath/PCI: >M nl, LAD 99p (2.5x28 Promus Prem DES), LCX 100 after large OM1, RCA dominant, 20p, EF 30%.  . Ischemic cardiomyopathy     a. 03/2013 EF 30% by LV gram.;  b. Echo (12/14): Mod LVH with severe basal septal hypertrophy w/o LVOT obstr, EF 50-55%, normal wall motion, Gr 1 DD, MAC, mild MR, mild LAE, trivial eff.    Past Surgical History  Procedure Laterality Date  . Tubal ligation    . Cholecystectomy    .  Abdominal hysterectomy      fibroid   at  age 53    History  Smoking status  . Never Smoker   Smokeless tobacco  . Never Used    History  Alcohol Use  . Yes    Comment: wine    Family History  Problem Relation Age of Onset  . Dementia Mother   . Hyperlipidemia Mother   . Hypertension Mother   . Coronary artery disease Father   . Hypertension Father   . Hyperlipidemia Father   . Cancer Sister 45    uterine cancer     Review of Systems: As noted in history of present illness.  All other systems were reviewed and are negative.  Physical Exam: BP 139/81  Pulse 67  Ht 5\' 4"  (1.626 m)  Wt 147 lb 1.6 oz (66.724 kg)  BMI 25.24 kg/m2 She is a pleasant, white female in no acute distress. HEENT: Normal Neck: No adenopathy, JVD, bruits, or thyromegaly. Lungs: Clear Cardiac: Regular  rate and rhythm. No gallop or murmur. Normal S1-2.  Abdomen: Soft and nontender. No hepatosplenomegaly. Bowel sounds are positive. Extremities: No cyanosis or edema. Pulses are 2+. Skin: Warm and dry Neurological alert and oriented x3. Cranial nerves II through XII are intact.  LABORATORY DATA: Lab Results  Component Value Date   WBC 6.2 03/21/2014   HGB 14.2 03/21/2014   HCT 42.1 03/21/2014   PLT 271.0 03/21/2014   GLUCOSE 105* 03/21/2014   GLUCOSE 105* 03/21/2014   CHOL 242* 03/21/2014   TRIG 325.0* 03/21/2014   HDL 40.50 03/21/2014   LDLDIRECT 161.6 03/21/2014   LDLCALC 134* 03/16/2013   ALT 14 03/21/2014   AST 22 03/21/2014   NA 139 03/21/2014   NA 139 03/21/2014   K 4.1 03/21/2014   K 4.1 03/21/2014   CL 105 03/21/2014   CL 105 03/21/2014   CREATININE 0.8 03/21/2014   CREATININE 0.8 03/21/2014   BUN 11 03/21/2014   BUN 11 03/21/2014   CO2 27 03/21/2014   CO2 27 03/21/2014   TSH 0.14* 03/21/2014   INR 0.94 03/16/2013   Ecg today shows NSR with old anteroseptal infarct with Q waves and chronic ST elevation in V1-4. Also Q waves in III and Avf. I have personally reviewed and interpreted this study.    Assessment / Plan: 1. Coronary disease status post stenting of the proximal LAD in September 2014 with drug-eluting stent. 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 Continue DAPT with ASA and Plavix for now. Continue her other antianginal therapy. I am concerned about her recent symptoms of dyspnea which may be anginal equivalent. I have recommended a stress Myoview to assess stent patency and assess EF.   2. Hyperlipidemia. History of intolerance to multiple statins. Work on dietary modifications. Will give a trial of Zetia 10 mg daily. Not a candidate for bile acid sequestrants with high triglycerides. Could consider a different fenofibrate. Offered her evaluation in our lipid clinic but she deferred.   3. Hypertension- fair control. Continue Rx.  4. Hypothyroidism.

## 2014-05-05 ENCOUNTER — Telehealth (HOSPITAL_COMMUNITY): Payer: Self-pay

## 2014-05-05 NOTE — Telephone Encounter (Signed)
Encounter complete. 

## 2014-05-09 ENCOUNTER — Telehealth (HOSPITAL_COMMUNITY): Payer: Self-pay

## 2014-05-09 NOTE — Telephone Encounter (Signed)
Encounter complete. 

## 2014-05-10 ENCOUNTER — Ambulatory Visit (HOSPITAL_COMMUNITY)
Admission: RE | Admit: 2014-05-10 | Discharge: 2014-05-10 | Disposition: A | Discharge status: Home / Self Care | Payer: Medicare Other | Source: Ambulatory Visit | Attending: Cardiovascular Disease | Admitting: Cardiovascular Disease

## 2014-05-10 DIAGNOSIS — Z8249 Family history of ischemic heart disease and other diseases of the circulatory system: Secondary | ICD-10-CM | POA: Insufficient documentation

## 2014-05-10 DIAGNOSIS — R0609 Other forms of dyspnea: Secondary | ICD-10-CM | POA: Diagnosis present

## 2014-05-10 DIAGNOSIS — Z789 Other specified health status: Secondary | ICD-10-CM

## 2014-05-10 DIAGNOSIS — I10 Essential (primary) hypertension: Secondary | ICD-10-CM | POA: Diagnosis not present

## 2014-05-10 DIAGNOSIS — E785 Hyperlipidemia, unspecified: Secondary | ICD-10-CM | POA: Diagnosis not present

## 2014-05-10 DIAGNOSIS — R42 Dizziness and giddiness: Secondary | ICD-10-CM | POA: Insufficient documentation

## 2014-05-10 DIAGNOSIS — R0600 Dyspnea, unspecified: Secondary | ICD-10-CM

## 2014-05-10 DIAGNOSIS — I2510 Atherosclerotic heart disease of native coronary artery without angina pectoris: Secondary | ICD-10-CM

## 2014-05-10 MED ORDER — TECHNETIUM TC 99M SESTAMIBI GENERIC - CARDIOLITE
10.5000 | Freq: Once | INTRAVENOUS | Status: AC | PRN
  Administered 2014-05-10: 11 via INTRAVENOUS

## 2014-05-10 MED ORDER — AMINOPHYLLINE 25 MG/ML IV SOLN
75.0000 mg | Freq: Once | INTRAVENOUS | Status: AC
  Administered 2014-05-10: 75 mg via INTRAVENOUS

## 2014-05-10 MED ORDER — REGADENOSON 0.4 MG/5ML IV SOLN
0.4000 mg | Freq: Once | INTRAVENOUS | Status: AC
  Administered 2014-05-10: 0.4 mg via INTRAVENOUS

## 2014-05-10 MED ORDER — TECHNETIUM TC 99M SESTAMIBI GENERIC - CARDIOLITE
30.4000 | Freq: Once | INTRAVENOUS | Status: AC | PRN
  Administered 2014-05-10: 30 via INTRAVENOUS

## 2014-05-10 NOTE — Procedures (Addendum)
[facility] [address] [place] [address] [phone]  Cardiology Nuclear Med Study  Veronica Barron is a 73 y.o. female     MRN : [id number]     DOB: March 09, 1941  Procedure Date: 05/10/2014  Nuclear Med Background Indication for Stress Test:  Evaluation for Ischemia History:  CAD;STENT/PTCA-03/2013;Ischemic cardiomyopathy;Bronchiolitis;DVT of colon;Last NUC MPI on 03/15/2013-nonischemic;EF=29% Cardiac Risk Factors: Family History - CAD, Hypertension and Lipids  Symptoms:  DOE, Light-Headedness and SOB   Nuclear Pre-Procedure Caffeine/Decaff Intake:  1:00am NPO After: 11am   IV Site: R Forearm  IV 0.9% NS with Angio Cath:  22g  Chest Size (in):  n/a IV Started by: Rolene Course, RN  Height: 5\' 4"  (1.626 m)  Cup Size: C  BMI:  Body mass index is 25.22 kg/(m^2). Weight:  147 lb (66.679 kg)   Tech Comments:  Pt. Resting BP was 202/118, was changed to [place] 1 or 2 day study: 1 day  Stress Test Type:  Stress  Order Authorizing Provider:  Peter [name], MD   Resting Radionuclide: Technetium 53m Sestamibi  Resting Radionuclide Dose: 10.5 mCi   Stress Radionuclide:  Technetium 60m Sestamibi  Stress Radionuclide Dose: 30.4 mCi           Stress Protocol Rest HR: 77 Stress HR: 85  Rest BP: 180/96 Stress BP: 188/90  Exercise Time (min): n/a METS: n/a   Predicted Max HR: 147 bpm % Max HR: 66.67 bpm Rate Pressure Product: 18424  Dose of Adenosine (mg):  n/a Dose of Lexiscan: 0.4 mg  Dose of Atropine (mg): n/a Dose of Dobutamine: n/a mcg/kg/min (at max HR)  Stress Test Technologist: Leane Para, CCT Nuclear Technologist: Imagene Riches, CNMT   Rest Procedure:  Myocardial perfusion imaging was performed at rest 45 minutes following the intravenous administration of Technetium 85m Sestamibi. Stress Procedure:  The patient received IV Lexiscan 0.4 mg over 15-seconds.  Technetium 21m Sestamibi  injected IV at 30-seconds.  Patient experienced SOB and lightheadedness and 75 mg Aminophylline IV was administered.  There were no significant changes with Lexiscan.  Quantitative spect images were obtained after a 45 minute delay.  Transient Ischemic Dilatation (Normal <1.22):  1.11  QGS EDV:  105 ml QGS ESV:  68 ml LV Ejection Fraction: 35%       Rest ECG: Sinus rhythm, PVC, septal MI, nonspecific ST changes.  Stress ECG: No significant ST segment change suggestive of ischemia.  QPS Raw Data Images:  There is interference from nuclear activity from structures below the diaphragm. This does not affect the ability to read the study. Stress Images:  There is decreased uptake in the anterior wall, inferior wall and apex. Rest Images:  There is decreased uptake in the anterior wall, inferior wall and apex. Subtraction (SDS):  There is a fixed defect that is most consistent with a previous infarction.  Impression Exercise Capacity:  Lexiscan with no exercise. BP Response:  Normal blood pressure response. Clinical Symptoms:  There is dyspnea. ECG Impression:  No significant ST segment change suggestive of ischemia. Comparison with Prior Nuclear Study: No previous nuclear study performed  Overall Impression:  High risk stress nuclear study with large, severe, fixed anterior, apical and inferior defect consistent with prior infarct; no significant ischemia; study felt to be high risk due to reduced LV function.  LV Wall Motion:  Akinesis of the distal anterior wall, distal inferior wall and apex.   Veronica Barron  Veronica Breed, MD  05/10/2014 4:45 PM

## 2014-05-11 ENCOUNTER — Other Ambulatory Visit: Payer: Self-pay

## 2014-05-11 ENCOUNTER — Other Ambulatory Visit: Payer: Self-pay | Admitting: Cardiology

## 2014-05-11 DIAGNOSIS — R9439 Abnormal result of other cardiovascular function study: Secondary | ICD-10-CM

## 2014-05-11 DIAGNOSIS — I200 Unstable angina: Secondary | ICD-10-CM

## 2014-05-17 ENCOUNTER — Ambulatory Visit
Admission: RE | Admit: 2014-05-17 | Discharge: 2014-05-17 | Disposition: A | Discharge status: Home / Self Care | Payer: Medicare Other | Source: Ambulatory Visit | Attending: Cardiology | Admitting: Cardiology

## 2014-05-17 ENCOUNTER — Other Ambulatory Visit: Payer: Self-pay

## 2014-05-17 DIAGNOSIS — I10 Essential (primary) hypertension: Secondary | ICD-10-CM

## 2014-05-17 DIAGNOSIS — I200 Unstable angina: Secondary | ICD-10-CM

## 2014-05-17 DIAGNOSIS — I255 Ischemic cardiomyopathy: Secondary | ICD-10-CM

## 2014-05-17 LAB — CBC WITH DIFFERENTIAL/PLATELET
Basophils Absolute: 0 10*3/uL (ref 0.0–0.1)
Basophils Relative: 0 % (ref 0–1)
Eosinophils Absolute: 0.2 10*3/uL (ref 0.0–0.7)
Eosinophils Relative: 3 % (ref 0–5)
HCT: 42.3 % (ref 36.0–46.0)
Hemoglobin: 14.5 g/dL (ref 12.0–15.0)
LYMPHS PCT: 34 % (ref 12–46)
Lymphs Abs: 2.4 10*3/uL (ref 0.7–4.0)
MCH: 29.3 pg (ref 26.0–34.0)
MCHC: 34.3 g/dL (ref 30.0–36.0)
MCV: 85.5 fL (ref 78.0–100.0)
Monocytes Absolute: 0.4 10*3/uL (ref 0.1–1.0)
Monocytes Relative: 6 % (ref 3–12)
NEUTROS PCT: 57 % (ref 43–77)
Neutro Abs: 4 10*3/uL (ref 1.7–7.7)
PLATELETS: 252 10*3/uL (ref 150–400)
RBC: 4.95 MIL/uL (ref 3.87–5.11)
RDW: 14.1 % (ref 11.5–15.5)
WBC: 7 10*3/uL (ref 4.0–10.5)

## 2014-05-17 LAB — BASIC METABOLIC PANEL
BUN: 14 mg/dL (ref 6–23)
CHLORIDE: 101 meq/L (ref 96–112)
CO2: 29 mEq/L (ref 19–32)
Calcium: 9.3 mg/dL (ref 8.4–10.5)
Creat: 0.72 mg/dL (ref 0.50–1.10)
Glucose, Bld: 86 mg/dL (ref 70–99)
POTASSIUM: 4.6 meq/L (ref 3.5–5.3)
Sodium: 139 mEq/L (ref 135–145)

## 2014-05-18 LAB — PROTIME-INR
INR: 0.92 (ref <1.50)
Prothrombin Time: 12.4 seconds (ref 11.6–15.2)

## 2014-05-23 ENCOUNTER — Encounter (HOSPITAL_COMMUNITY)
Admission: RE | Disposition: A | Discharge status: Home / Self Care | Payer: Self-pay | Source: Ambulatory Visit | Attending: Cardiology

## 2014-05-23 ENCOUNTER — Ambulatory Visit (HOSPITAL_COMMUNITY)
Admission: RE | Admit: 2014-05-23 | Discharge: 2014-05-23 | Disposition: A | Discharge status: Home / Self Care | Payer: Medicare Other | Source: Ambulatory Visit | Attending: Cardiology | Admitting: Cardiology

## 2014-05-23 DIAGNOSIS — Z79899 Other long term (current) drug therapy: Secondary | ICD-10-CM | POA: Insufficient documentation

## 2014-05-23 DIAGNOSIS — R079 Chest pain, unspecified: Secondary | ICD-10-CM | POA: Diagnosis present

## 2014-05-23 DIAGNOSIS — Z8249 Family history of ischemic heart disease and other diseases of the circulatory system: Secondary | ICD-10-CM | POA: Insufficient documentation

## 2014-05-23 DIAGNOSIS — I10 Essential (primary) hypertension: Secondary | ICD-10-CM | POA: Insufficient documentation

## 2014-05-23 DIAGNOSIS — I2510 Atherosclerotic heart disease of native coronary artery without angina pectoris: Secondary | ICD-10-CM | POA: Diagnosis not present

## 2014-05-23 DIAGNOSIS — Z7982 Long term (current) use of aspirin: Secondary | ICD-10-CM | POA: Diagnosis not present

## 2014-05-23 DIAGNOSIS — E785 Hyperlipidemia, unspecified: Secondary | ICD-10-CM | POA: Diagnosis not present

## 2014-05-23 DIAGNOSIS — R9439 Abnormal result of other cardiovascular function study: Secondary | ICD-10-CM

## 2014-05-23 DIAGNOSIS — I255 Ischemic cardiomyopathy: Secondary | ICD-10-CM | POA: Diagnosis present

## 2014-05-23 DIAGNOSIS — I519 Heart disease, unspecified: Secondary | ICD-10-CM | POA: Diagnosis not present

## 2014-05-23 DIAGNOSIS — Z955 Presence of coronary angioplasty implant and graft: Secondary | ICD-10-CM | POA: Insufficient documentation

## 2014-05-23 DIAGNOSIS — E039 Hypothyroidism, unspecified: Secondary | ICD-10-CM | POA: Diagnosis not present

## 2014-05-23 DIAGNOSIS — R0600 Dyspnea, unspecified: Secondary | ICD-10-CM | POA: Diagnosis present

## 2014-05-23 HISTORY — PX: LEFT HEART CATHETERIZATION WITH CORONARY ANGIOGRAM: SHX5451

## 2014-05-23 SURGERY — LEFT HEART CATHETERIZATION WITH CORONARY ANGIOGRAM
Anesthesia: LOCAL

## 2014-05-23 MED ORDER — HEPARIN SODIUM (PORCINE) 1000 UNIT/ML IJ SOLN
INTRAMUSCULAR | Status: AC
  Filled 2014-05-23: qty 1

## 2014-05-23 MED ORDER — FENTANYL CITRATE 0.05 MG/ML IJ SOLN
INTRAMUSCULAR | Status: AC
  Filled 2014-05-23: qty 2

## 2014-05-23 MED ORDER — LIDOCAINE HCL (PF) 1 % IJ SOLN
INTRAMUSCULAR | Status: AC
  Filled 2014-05-23: qty 30

## 2014-05-23 MED ORDER — NITROGLYCERIN 1 MG/10 ML FOR IR/CATH LAB
INTRA_ARTERIAL | Status: AC
  Filled 2014-05-23: qty 10

## 2014-05-23 MED ORDER — MIDAZOLAM HCL 2 MG/2ML IJ SOLN
INTRAMUSCULAR | Status: AC
  Filled 2014-05-23: qty 2

## 2014-05-23 MED ORDER — SODIUM CHLORIDE 0.9 % IV SOLN: 250.0000 mL | INTRAVENOUS | Status: DC | PRN

## 2014-05-23 MED ORDER — ASPIRIN 81 MG PO CHEW: 81.0000 mg | CHEWABLE_TABLET | ORAL | Status: DC

## 2014-05-23 MED ORDER — SODIUM CHLORIDE 0.9 % IJ SOLN: 3.0000 mL | INTRAMUSCULAR | Status: DC | PRN

## 2014-05-23 MED ORDER — SODIUM CHLORIDE 0.9 % IV SOLN: 1.0000 mL/kg/h | INTRAVENOUS | Status: DC

## 2014-05-23 MED ORDER — HEPARIN (PORCINE) IN NACL 2-0.9 UNIT/ML-% IJ SOLN
INTRAMUSCULAR | Status: AC
  Filled 2014-05-23: qty 1000

## 2014-05-23 MED ORDER — SODIUM CHLORIDE 0.9 % IV SOLN
INTRAVENOUS | Status: DC
  Administered 2014-05-23: 09:00:00 via INTRAVENOUS

## 2014-05-23 MED ORDER — VERAPAMIL HCL 2.5 MG/ML IV SOLN
INTRAVENOUS | Status: AC
  Filled 2014-05-23: qty 2

## 2014-05-23 MED ORDER — SODIUM CHLORIDE 0.9 % IJ SOLN: 3.0000 mL | Freq: Two times a day (BID) | INTRAMUSCULAR | Status: DC

## 2014-05-23 NOTE — Discharge Instructions (Signed)
Radial Site Care °Refer to this sheet in the next few weeks. These instructions provide you with information on caring for yourself after your procedure. Your caregiver may also give you more specific instructions. Your treatment has been planned according to current medical practices, but problems sometimes occur. Call your caregiver if you have any problems or questions after your procedure. °HOME CARE INSTRUCTIONS °· You may shower the day after the procedure. Remove the bandage (dressing) and gently wash the site with plain soap and water. Gently pat the site dry. °· Do not apply powder or lotion to the site. °· Do not submerge the affected site in water for 3 to 5 days. °· Inspect the site at least twice daily. °· Do not flex or bend the affected arm for 24 hours. °· No lifting over 5 pounds (2.3 kg) for 5 days after your procedure. °· Do not drive home if you are discharged the same day of the procedure. Have someone else drive you. °· You may drive 24 hours after the procedure unless otherwise instructed by your caregiver. °· Do not operate machinery or power tools for 24 hours. °· A responsible adult should be with you for the first 24 hours after you arrive home. °What to expect: °· Any bruising will usually fade within 1 to 2 weeks. °· Blood that collects in the tissue (hematoma) may be painful to the touch. It should usually decrease in size and tenderness within 1 to 2 weeks. °SEEK IMMEDIATE MEDICAL CARE IF: °· You have unusual pain at the radial site. °· You have redness, warmth, swelling, or pain at the radial site. °· You have drainage (other than a small amount of blood on the dressing). °· You have chills. °· You have a fever or persistent symptoms for more than 72 hours. °· You have a fever and your symptoms suddenly get worse. °· Your arm becomes pale, cool, tingly, or numb. °· You have heavy bleeding from the site. Hold pressure on the site. °Document Released: 08/02/2010 Document Revised:  09/22/2011 Document Reviewed: 08/02/2010 °ExitCare® Patient Information ©2015 [employer]. This information is not intended to replace advice given to you by your health care provider. Make sure you discuss any questions you have with your health care provider. ° °

## 2014-05-23 NOTE — Progress Notes (Signed)
TR BAND REMOVAL  LOCATION:    right radial  DEFLATED PER PROTOCOL:    Yes.    TIME BAND OFF / DRESSING APPLIED:    1400   SITE UPON ARRIVAL:    Level 0  SITE AFTER BAND REMOVAL:    Level 0  CIRCULATION SENSATION AND MOVEMENT:    Within Normal Limits   Yes.    COMMENTS:   BAND REMOVED/ TEGADERM DSG APPLIED.

## 2014-05-23 NOTE — H&P (View-Only) (Signed)
[name] Date of Birth: [birth year] Medical Record #[id number]  History of Present Illness: Mrs. Saefong is seen today for followup. She has a history of hypertension, hyperlipidemia, and hypothyroidism. In Sept 2014 she had stenting of the proximal LAD with DES.  The distal left circumflex was occluded with left to left collaterals and right-to-left collaterals. The right coronary was without significant disease. Ejection fraction was 30%.  Her other disease was treated medically. On followup Echo in Dec. 2014 EF had improved to 50-55%.  She has a history of intolerance to multiple statins and Lopid.  On follow up today she notes that she was recently at a flea market in [place]. Walking up hill she became acutely SOB. This lasted about 20 minutes but then it took several days to feel back to normal. No chest pain.   Current Outpatient Prescriptions on File Prior to Visit  Medication Sig Dispense Refill  . ALFALFA PO Take 1 tablet by mouth daily.      Marland Kitchen aspirin EC 81 MG tablet Take 81 mg by mouth daily.      . calcium carbonate (OS-CAL) 600 MG TABS Take 600 mg by mouth daily.      . clorazepate (TRANXENE-T) 7.5 MG tablet Take 1 tablet (7.5 mg total) by mouth at bedtime as needed for anxiety.  90 tablet  3  . Cyanocobalamin (VITAMIN B 12 PO) Take 1 capsule by mouth daily.      . ergocalciferol (DRISDOL) 50000 UNITS capsule Take 1 capsule (50,000 Units total) by mouth once a week.  4 capsule  0  . levothyroxine (SYNTHROID, LEVOTHROID) 88 MCG tablet Take 1 tablet (88 mcg total) by mouth daily.  90 tablet  0  . multivitamin-lutein (OCUVITE-LUTEIN) CAPS capsule Take 1 capsule by mouth daily.      . nitroGLYCERIN (NITROSTAT) 0.4 MG SL tablet Place 1 tablet (0.4 mg total) under the tongue every 5 (five) minutes as needed for chest pain.  50 tablet  3  . pantoprazole (PROTONIX) 40 MG tablet Take 40 mg daily if needed  90 tablet  3  . sertraline (ZOLOFT) 100 MG tablet Take 0.5 tablets (50 mg  total) by mouth daily.  90 tablet  1  . Tdap (BOOSTRIX) 5-2.5-18.5 LF-MCG/0.5 injection Inject 0.5 mLs into the muscle once.  0.5 mL  0  . vitamin C (ASCORBIC ACID) 500 MG tablet Take 500 mg by mouth daily.       No current facility-administered medications on file prior to visit.    Allergies  Allergen Reactions  . Diphenhydramine Hcl     REACTION: TACHYCARDIA  . Statins     Myalgia     Past Medical History  Diagnosis Date  . Leiomyoma of uterus, unspecified   . Unspecified hypothyroidism   . Hypertension   . Hyperlipidemia   . Allergic rhinitis, cause unspecified   . Diverticulosis of colon   . CAD (coronary artery disease)     a. 03/2013 Abnl MV, EF 29%;  b. 03/2013 Cath/PCI: >M nl, LAD 99p (2.5x28 Promus Prem DES), LCX 100 after large OM1, RCA dominant, 20p, EF 30%.  . Ischemic cardiomyopathy     a. 03/2013 EF 30% by LV gram.;  b. Echo (12/14): Mod LVH with severe basal septal hypertrophy w/o LVOT obstr, EF 50-55%, normal wall motion, Gr 1 DD, MAC, mild MR, mild LAE, trivial eff.    Past Surgical History  Procedure Laterality Date  . Tubal ligation    . Cholecystectomy    .  Abdominal hysterectomy      fibroid   at  age 73    History  Smoking status  . Never Smoker   Smokeless tobacco  . Never Used    History  Alcohol Use  . Yes    Comment: wine    Family History  Problem Relation Age of Onset  . Dementia Mother   . Hyperlipidemia Mother   . Hypertension Mother   . Coronary artery disease Father   . Hypertension Father   . Hyperlipidemia Father   . Cancer Sister 48    uterine cancer     Review of Systems: As noted in history of present illness.  All other systems were reviewed and are negative.  Physical Exam: BP 139/81  Pulse 67  Ht 5\' 4"  (1.626 m)  Wt 147 lb 1.6 oz (66.724 kg)  BMI 25.24 kg/m2 She is a pleasant, white female in no acute distress. HEENT: Normal Neck: No adenopathy, JVD, bruits, or thyromegaly. Lungs: Clear Cardiac: Regular  rate and rhythm. No gallop or murmur. Normal S1-2.  Abdomen: Soft and nontender. No hepatosplenomegaly. Bowel sounds are positive. Extremities: No cyanosis or edema. Pulses are 2+. Skin: Warm and dry Neurological alert and oriented x3. Cranial nerves II through XII are intact.  LABORATORY DATA: Lab Results  Component Value Date   WBC 6.2 03/21/2014   HGB 14.2 03/21/2014   HCT 42.1 03/21/2014   PLT 271.0 03/21/2014   GLUCOSE 105* 03/21/2014   GLUCOSE 105* 03/21/2014   CHOL 242* 03/21/2014   TRIG 325.0* 03/21/2014   HDL 40.50 03/21/2014   LDLDIRECT 161.6 03/21/2014   LDLCALC 134* 03/16/2013   ALT 14 03/21/2014   AST 22 03/21/2014   NA 139 03/21/2014   NA 139 03/21/2014   K 4.1 03/21/2014   K 4.1 03/21/2014   CL 105 03/21/2014   CL 105 03/21/2014   CREATININE 0.8 03/21/2014   CREATININE 0.8 03/21/2014   BUN 11 03/21/2014   BUN 11 03/21/2014   CO2 27 03/21/2014   CO2 27 03/21/2014   TSH 0.14* 03/21/2014   INR 0.94 03/16/2013   Ecg today shows NSR with old anteroseptal infarct with Q waves and chronic ST elevation in V1-4. Also Q waves in III and Avf. I have personally reviewed and interpreted this study.    Assessment / Plan: 1. Coronary disease status post stenting of the proximal LAD in September 2014 with drug-eluting stent. 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 Continue DAPT with ASA and Plavix for now. Continue her other antianginal therapy. I am concerned about her recent symptoms of dyspnea which may be anginal equivalent. I have recommended a stress Myoview to assess stent patency and assess EF.   2. Hyperlipidemia. History of intolerance to multiple statins. Work on dietary modifications. Will give a trial of Zetia 10 mg daily. Not a candidate for bile acid sequestrants with high triglycerides. Could consider a different fenofibrate. Offered her evaluation in our lipid clinic but she deferred.   3. Hypertension- fair control. Continue Rx.  4. Hypothyroidism.

## 2014-05-23 NOTE — CV Procedure (Signed)
    Cardiac Catheterization Procedure Note  Name: Veronica Barron MRN: [id number] DOB: 04-28-1941  Procedure: Left Heart Cath, Selective Coronary Angiography, LV angiography  Indication: 73 yo WF with prior stent of proximal LAD presents with dyspnea on exertion and abnormal myoview study.   Procedural Details: The right wrist was prepped, draped, and anesthetized with 1% lidocaine. Using the modified Seldinger technique, a 6 French slender sheath was introduced into the right radial artery. 3 mg of verapamil was administered through the sheath, weight-based unfractionated heparin was administered intravenously. Standard Judkins catheters were used for selective coronary angiography and left ventriculography. Catheter exchanges were performed over an exchange length guidewire. There were no immediate procedural complications. A TR band was used for radial hemostasis at the completion of the procedure.  The patient was transferred to the post catheterization recovery area for further monitoring.  Procedural Findings: Hemodynamics: AO 149/68 mean 101 mm Hg LV 142/14 mm Hg  Coronary angiography: Coronary dominance: right  Left mainstem: Short, no significant disease.  Left anterior descending (LAD): The LAD is a large vessel. The stent in the proximal vessel is widely patent. There is a 30% narrowing just distal to the stent. The first diagonal is normal.  Left circumflex (LCx): The LCx gives rise to a very small OM1 and a large OM2. It is occluded after the second OM. There are collaterals to a small posterolateral branch. The second OM has mild disease up to 20%.  Right coronary artery (RCA): The RCA has mild disease in the proximal and mid vessel up to 20%.   Left ventriculography: Left ventricular systolic function is abnormal, there is focal apical akinesis. LVEF is estimated at 45%, there is no significant mitral regurgitation   Final Conclusions:   1. Single vessel occlusive CAD  involving chronic occlusion of the distal LCx. The stent in the proximal LAD is patent.  2. Mild LV dysfunction with normal LVEDP.  Recommendations: Continue medical therapy.  Kendy Haston [name], [place]  05/23/2014, 12:18 PM

## 2014-05-23 NOTE — Progress Notes (Signed)
6cc of air anticipated to be in the band at 1330. Removed 3cc of air at 1330- no remaining air in band.

## 2014-05-23 NOTE — Interval H&P Note (Signed)
History and Physical Interval Note:  05/23/2014 11:50 AM  [name]  has presented today for surgery, with the diagnosis of abnormal mioview  The various methods of treatment have been discussed with the patient and family. After consideration of risks, benefits and other options for treatment, the patient has consented to  Procedure(s): LEFT HEART CATHETERIZATION WITH CORONARY ANGIOGRAM (N/A) as a surgical intervention .  The patient's history has been reviewed, patient examined, no change in status, stable for surgery.  I have reviewed the patient's chart and labs.  Questions were answered to the patient's satisfaction.    Cath Lab Visit (complete for each Cath Lab visit)  Clinical Evaluation Leading to the Procedure:   ACS: No.  Non-ACS:    Anginal Classification: CCS III  Anti-ischemic medical therapy: No Therapy  Non-Invasive Test Results: High-risk stress test findings: cardiac mortality >3%/year  Prior CABG: No previous CABG       Collier Salina [facility] 05/23/2014 11:50 AM

## 2014-06-12 ENCOUNTER — Ambulatory Visit (INDEPENDENT_AMBULATORY_CARE_PROVIDER_SITE_OTHER): Payer: Medicare Other | Admitting: Cardiology

## 2014-06-12 ENCOUNTER — Encounter: Payer: Self-pay | Admitting: Cardiology

## 2014-06-12 VITALS — BP 150/84 | HR 72 | Ht 64.0 in | Wt 148.3 lb

## 2014-06-12 DIAGNOSIS — E785 Hyperlipidemia, unspecified: Secondary | ICD-10-CM

## 2014-06-12 DIAGNOSIS — I2510 Atherosclerotic heart disease of native coronary artery without angina pectoris: Secondary | ICD-10-CM

## 2014-06-12 DIAGNOSIS — I10 Essential (primary) hypertension: Secondary | ICD-10-CM

## 2014-06-12 DIAGNOSIS — I255 Ischemic cardiomyopathy: Secondary | ICD-10-CM

## 2014-06-12 MED ORDER — PRAVASTATIN SODIUM 40 MG PO TABS: 40.0000 mg | ORAL_TABLET | Freq: Every evening | ORAL | Status: DC

## 2014-06-12 NOTE — Assessment & Plan Note (Signed)
Adding Pravachol.  she will check lipids before next visit with Dr. [name] in Jan. 2016.

## 2014-06-12 NOTE — Assessment & Plan Note (Signed)
Mildly elevated but she relates to white coat syndrome.  Normally at home on checks it is 130/70.

## 2014-06-12 NOTE — Assessment & Plan Note (Signed)
Recent cath with stable CAD.  Patent stent in prox LAD, and chronic occl of distal LCX.  No further symptoms   EF 45%.

## 2014-06-12 NOTE — Progress Notes (Signed)
06/12/2014   PCP: Deborra Medina, MD   Chief Complaint  Patient presents with  . Follow-up    post Cath.11/10.  Zetia making legs ache taking half tab.     Primary Cardiologist: Dr. P. [name]   HPI:    73 year old female is seen today for followup for Dr. [name] post cath.  She has a history of hypertension, hyperlipidemia, and hypothyroidism. In Sept 2014 she had stenting of the proximal LAD with DES. The distal left circumflex was occluded with left to left collaterals and right-to-left collaterals. The right coronary was without significant disease. Ejection fraction was 30%. Her other disease was treated medically. On followup Echo in Dec. 2014 EF had improved to 50-55%. She has a history of intolerance to multiple statins and Lopid. She noted that she was recently at a flea market in [place]. Walking up hill she became acutely SOB. This lasted about 20 minutes but then it took several days to feel back to normal. No chest pain.   She was seen by Dr. [name] and underwent cardiac cath:  Single vessel occlusive CAD involving chronic occlusion of the distal LCx. The stent in the proximal LAD is patent, Mild LV dysfunction with normal LVEDP  Plans for continued medical therapy.    She has had no further symptoms and recovered from cath without complications.  She is asking if she can go back on pravastatin, she stated she could tolerate this.  She is have some muscle cramping with the zetia and is taking 10 mg daily except  on 2 days a week only takes 5 mg.  Allergies  Allergen Reactions  . Diphenhydramine Hcl     REACTION: TACHYCARDIA  . Statins     Myalgia     Current Outpatient Prescriptions  Medication Sig Dispense Refill  . ALFALFA PO Take 1 tablet by mouth daily.    Marland Kitchen aspirin EC 81 MG tablet Take 81 mg by mouth daily.    . calcium carbonate (OS-CAL) 600 MG TABS Take 600 mg by mouth daily.    . carvedilol (COREG) 6.25 MG tablet Take 1 tablet (6.25 mg  total) by mouth 2 (two) times daily with a meal. 180 tablet 3  . clopidogrel (PLAVIX) 75 MG tablet Take 1 tablet (75 mg total) by mouth daily. 90 tablet 3  . clorazepate (TRANXENE-T) 7.5 MG tablet Take 1 tablet (7.5 mg total) by mouth at bedtime as needed for anxiety. (Patient taking differently: Take 7.5 mg by mouth every morning. ) 90 tablet 3  . ergocalciferol (DRISDOL) 50000 UNITS capsule Take 1 capsule (50,000 Units total) by mouth once a week. 4 capsule 0  . ezetimibe (ZETIA) 10 MG tablet Take 1 tablet (10 mg total) by mouth daily. (Patient taking differently: Take 10 mg by mouth every other day. ) 90 tablet 3  . levothyroxine (SYNTHROID, LEVOTHROID) 100 MCG tablet Take 100 mcg by mouth daily before breakfast.    . lisinopril (PRINIVIL,ZESTRIL) 5 MG tablet Take 1 tablet (5 mg total) by mouth 2 (two) times daily. 180 tablet 3  . multivitamin-lutein (OCUVITE-LUTEIN) CAPS capsule Take 1 capsule by mouth daily.    . nitroGLYCERIN (NITROSTAT) 0.4 MG SL tablet Place 1 tablet (0.4 mg total) under the tongue every 5 (five) minutes as needed for chest pain. 50 tablet 3  . pantoprazole (PROTONIX) 40 MG tablet Take 40 mg by mouth daily as needed. Take 40 mg daily if needed 90 tablet 3  .  sertraline (ZOLOFT) 100 MG tablet Take 0.5 tablets (50 mg total) by mouth daily. 90 tablet 1  . Tdap (BOOSTRIX) 5-2.5-18.5 LF-MCG/0.5 injection Inject 0.5 mLs into the muscle once. 0.5 mL 0  . vitamin B-12 (CYANOCOBALAMIN) 500 MCG tablet Take 500 mcg by mouth daily.    . vitamin C (ASCORBIC ACID) 500 MG tablet Take 500 mg by mouth daily.    . pravastatin (PRAVACHOL) 40 MG tablet Take 1 tablet (40 mg total) by mouth every evening. 30 tablet 0   No current facility-administered medications for this visit.    Past Medical History  Diagnosis Date  . Leiomyoma of uterus, unspecified   . Unspecified hypothyroidism   . Hypertension   . Hyperlipidemia   . Allergic rhinitis, cause unspecified   . Diverticulosis of colon    . CAD (coronary artery disease)     a. 03/2013 Abnl MV, EF 29%;  b. 03/2013 Cath/PCI: >M nl, LAD 99p (2.5x28 Promus Prem DES), LCX 100 after large OM1, RCA dominant, 20p, EF 30%.  . Ischemic cardiomyopathy     a. 03/2013 EF 30% by LV gram.;  b. Echo (12/14): Mod LVH with severe basal septal hypertrophy w/o LVOT obstr, EF 50-55%, normal wall motion, Gr 1 DD, MAC, mild MR, mild LAE, trivial eff.    Past Surgical History  Procedure Laterality Date  . Tubal ligation    . Cholecystectomy    . Abdominal hysterectomy      fibroid   at  age 69    TMA:UQJFHLK:TG colds or fevers, no weight changes Skin:no rashes or ulcers HEENT:no blurred vision, no congestion CV:see HPI PUL:see HPI GI:no diarrhea constipation or melena, no indigestion GU:no hematuria, no dysuria MS:no joint pain, no claudication Neuro:no syncope, no lightheadedness Endo:no diabetes, + thyroid disease  Wt Readings from Last 3 Encounters:  06/12/14 148 lb 4.8 oz (67.268 kg)  05/23/14 147 lb (66.679 kg)  05/10/14 147 lb (66.679 kg)    PHYSICAL EXAM BP 150/84 mmHg  Pulse 72  Ht 5\' 4"  (1.626 m)  Wt 148 lb 4.8 oz (67.268 kg)  BMI 25.44 kg/m2 General:Pleasant affect, NAD Skin:Warm and dry, brisk capillary refill HEENT:normocephalic, sclera clear, mucus membranes moist Neck:supple, no JVD, no bruits  Heart:S1S2 RRR without murmur, gallup, rub or click Lungs:clear without rales, rhonchi, or wheezes YBW:LSLH, non tender, + BS, do not palpate liver spleen or masses Ext:no lower ext edema, 2+ pedal pulses, 2+ radial pulses cath site without hematoma Neuro:alert and oriented X 3, MAE, follows commands, + facial symmetry  TDS:KAJG  ASSESSMENT AND PLAN CAD (coronary artery disease) Recent cath with stable CAD.  Patent stent in prox LAD, and chronic occl of distal LCX.  No further symptoms   EF 45%.  Cardiomyopathy, ischemic Mildly decreased from last echo in 2014, now 45%.  Euvolemic. With stable BP we could increase  ACE.  Though this is improved from 29% with last cath 2014.   Hyperlipidemia Adding Pravachol.  she will check lipids before next visit with Dr. [name] in Jan. 2016.    Essential hypertension Mildly elevated but she relates to white coat syndrome.  Normally at home on checks it is 130/70.

## 2014-06-12 NOTE — Patient Instructions (Signed)
START Pravachol 40mg  Daily, this has been sent into the [facility] in [place].  Keep your scheduled follow up and the lbs that are already ordered.

## 2014-06-12 NOTE — Assessment & Plan Note (Addendum)
Mildly decreased from last echo in 2014, now 45%.  Euvolemic. With stable BP we could increase ACE.  Though this is improved from 29% with last cath 2014.

## 2014-06-22 ENCOUNTER — Encounter (HOSPITAL_COMMUNITY): Payer: Self-pay | Admitting: Cardiology

## 2014-07-12 ENCOUNTER — Other Ambulatory Visit: Payer: Self-pay | Admitting: *Deleted

## 2014-07-12 MED ORDER — LEVOTHYROXINE SODIUM 88 MCG PO TABS: 88.0000 ug | ORAL_TABLET | Freq: Every day | ORAL | Status: DC

## 2014-07-17 ENCOUNTER — Other Ambulatory Visit: Payer: Self-pay | Admitting: *Deleted

## 2014-07-17 MED ORDER — LEVOTHYROXINE SODIUM 88 MCG PO TABS: 88.0000 ug | ORAL_TABLET | Freq: Every day | ORAL | Status: DC

## 2014-08-02 LAB — HEPATIC FUNCTION PANEL
ALT: 18 U/L (ref 0–35)
AST: 22 U/L (ref 0–37)
Albumin: 4.3 g/dL (ref 3.5–5.2)
Alkaline Phosphatase: 56 U/L (ref 39–117)
BILIRUBIN TOTAL: 0.5 mg/dL (ref 0.2–1.2)
Bilirubin, Direct: 0.1 mg/dL (ref 0.0–0.3)
Indirect Bilirubin: 0.4 mg/dL (ref 0.2–1.2)
Total Protein: 6.9 g/dL (ref 6.0–8.3)

## 2014-08-02 LAB — LIPID PANEL
CHOL/HDL RATIO: 5.3 ratio
Cholesterol: 265 mg/dL — ABNORMAL HIGH (ref 0–200)
HDL: 50 mg/dL (ref >39)
LDL Cholesterol: 167 mg/dL — ABNORMAL HIGH (ref 0–99)
Triglycerides: 242 mg/dL — ABNORMAL HIGH (ref <150)
VLDL: 48 mg/dL — AB (ref 0–40)

## 2014-08-02 LAB — BASIC METABOLIC PANEL
BUN: 15 mg/dL (ref 6–23)
CALCIUM: 9.3 mg/dL (ref 8.4–10.5)
CHLORIDE: 102 meq/L (ref 96–112)
CO2: 28 mEq/L (ref 19–32)
CREATININE: 0.75 mg/dL (ref 0.50–1.10)
GLUCOSE: 103 mg/dL — AB (ref 70–99)
Potassium: 4.8 mEq/L (ref 3.5–5.3)
Sodium: 139 mEq/L (ref 135–145)

## 2014-08-09 ENCOUNTER — Ambulatory Visit (INDEPENDENT_AMBULATORY_CARE_PROVIDER_SITE_OTHER): Payer: Medicare Other | Admitting: Cardiology

## 2014-08-09 ENCOUNTER — Encounter: Payer: Self-pay | Admitting: Cardiology

## 2014-08-09 VITALS — BP 140/80 | HR 74 | Ht 64.0 in | Wt 150.0 lb

## 2014-08-09 DIAGNOSIS — I10 Essential (primary) hypertension: Secondary | ICD-10-CM

## 2014-08-09 DIAGNOSIS — E785 Hyperlipidemia, unspecified: Secondary | ICD-10-CM

## 2014-08-09 DIAGNOSIS — I2510 Atherosclerotic heart disease of native coronary artery without angina pectoris: Secondary | ICD-10-CM

## 2014-08-09 DIAGNOSIS — I255 Ischemic cardiomyopathy: Secondary | ICD-10-CM

## 2014-08-09 MED ORDER — FENOFIBRATE 160 MG PO TABS
160.0000 mg | ORAL_TABLET | Freq: Every day | ORAL | Status: DC
Start: 2014-08-09 — End: 2014-11-02

## 2014-08-09 NOTE — Progress Notes (Signed)
[facility] Date of Birth: 1940-10-21 Medical Record #[id number]  History of Present Illness: Mrs. [facility]onica [facility] is seen today for followup CAD. She has a history of hypertension, hyperlipidemia, and hypothyroidism. In Sept 2014 she had stenting of the proximal LAD with DES.  The distal left circumflex was occluded with left to left collaterals and right-to-left collaterals. The right coronary was without significant disease. Ejection fraction was 30%.  Her other disease was treated medically. On followup Echo in Dec. 2014 EF had improved to 50-55%. In November she had a significant episode of chest pain. Repeat cardiac cath showed occlusion of the distal LCx and the stent in the LAD was patent. She has had no other chest pain.  She has a history of intolerance to multiple statins, Zetia and Lopid.    Current Outpatient Prescriptions on File Prior to Visit  Medication Sig Dispense Refill  . ALFALFA PO Take 1 tablet by mouth daily.    Marland Kitchen aspirin EC 81 MG tablet Take 81 mg by mouth daily.    . calcium carbonate (OS-CAL) 600 MG TABS Take 600 mg by mouth daily.    . carvedilol (COREG) 6.25 MG tablet Take 1 tablet (6.25 mg total) by mouth 2 (two) times daily with a meal. 180 tablet 3  . clopidogrel (PLAVIX) 75 MG tablet Take 1 tablet (75 mg total) by mouth daily. 90 tablet 3  . ergocalciferol (DRISDOL) 50000 UNITS capsule Take 1 capsule (50,000 Units total) by mouth once a week. 4 capsule 0  . levothyroxine (SYNTHROID, LEVOTHROID) 88 MCG tablet Take 1 tablet (88 mcg total) by mouth daily before breakfast. 90 tablet 2  . lisinopril (PRINIVIL,ZESTRIL) 5 MG tablet Take 1 tablet (5 mg total) by mouth 2 (two) times daily. 180 tablet 3  . multivitamin-lutein (OCUVITE-LUTEIN) CAPS capsule Take 1 capsule by mouth daily.    . nitroGLYCERIN (NITROSTAT) 0.4 MG SL tablet Place 1 tablet (0.4 mg total) under the tongue every 5 (five) minutes as needed for chest pain. 50 tablet 3  . sertraline (ZOLOFT) 100 MG tablet  Take 0.5 tablets (50 mg total) by mouth daily. 90 tablet 1  . Tdap (BOOSTRIX) 5-2.5-18.5 LF-MCG/0.5 injection Inject 0.5 mLs into the muscle once. 0.5 mL 0  . vitamin B-12 (CYANOCOBALAMIN) 500 MCG tablet Take 500 mcg by mouth daily.    . vitamin C (ASCORBIC ACID) 500 MG tablet Take 500 mg by mouth daily.     No current facility-administered medications on file prior to visit.    Allergies  Allergen Reactions  . Diphenhydramine Hcl     REACTION: TACHYCARDIA  . Statins     Myalgia   . Zetia [Ezetimibe] Other (See Comments)    Myalgias     Past Medical History  Diagnosis Date  . Leiomyoma of uterus, unspecified   . Unspecified hypothyroidism   . Hypertension   . Hyperlipidemia   . Allergic rhinitis, cause unspecified   . Diverticulosis of colon   . CAD (coronary artery disease)     a. 03/2013 Abnl MV, EF 29%;  b. 03/2013 Cath/PCI: >M nl, LAD 99p (2.5x28 Promus Prem DES), LCX 100 after large OM1, RCA dominant, 20p, EF 30%.  . Ischemic cardiomyopathy     a. 03/2013 EF 30% by LV gram.;  b. Echo (12/14): Mod LVH with severe basal septal hypertrophy w/o LVOT obstr, EF 50-55%, normal wall motion, Gr 1 DD, MAC, mild MR, mild LAE, trivial eff.    Past Surgical History  Procedure Laterality Date  .  Tubal ligation    . Cholecystectomy    . Abdominal hysterectomy      fibroid   at  age 81  . Left heart catheterization with coronary angiogram N/A 03/16/2013    Procedure: LEFT HEART CATHETERIZATION WITH CORONARY ANGIOGRAM;  Surgeon: Veronica Rumbold M [name], MD;  [facility];  Service: Cardiovascular;  Laterality: N/A;  . Percutaneous coronary stent intervention (pci-s)  03/16/2013    Procedure: PERCUTANEOUS CORONARY STENT INTERVENTION (PCI-S);  Surgeon: Veronica Aldridge M [name], MD;  Location: [facility];  Service: Cardiovascular;;  . Left heart catheterization with coronary angiogram N/A 05/23/2014    Procedure: LEFT HEART CATHETERIZATION WITH CORONARY ANGIOGRAM;  Surgeon: Veronica Blades M [name], MD;   Location: [facility];  Service: Cardiovascular;  Laterality: N/A;    History  Smoking status  . Never Smoker   Smokeless tobacco  . Never Used    History  Alcohol Use  . Yes    Comment: wine    Family History  Problem Relation Age of Onset  . Dementia Mother   . Hyperlipidemia Mother   . Hypertension Mother   . Coronary artery disease Father   . Hypertension Father   . Hyperlipidemia Father   . Cancer Sister 31    uterine cancer     Review of Systems: As noted in history of present illness.  All other systems were reviewed and are negative.  Physical Exam: BP 140/80 mmHg  Pulse 74  Ht 5\' 4"  (1.626 m)  Wt 150 lb (68.04 kg)  BMI 25.73 kg/m2 She is a pleasant, white female in no acute distress. HEENT: Normal Neck: No adenopathy, JVD, bruits, or thyromegaly. Lungs: Clear Cardiac: Regular rate and rhythm. No gallop or murmur. Normal S1-2.  Abdomen: Soft and nontender. No hepatosplenomegaly. Bowel sounds are positive. Extremities: No cyanosis or edema. Pulses are 2+. Skin: Warm and dry Neurological alert and oriented x3. Cranial nerves II through XII are intact.  LABORATORY DATA: Lab Results  Component Value Date   WBC 7.0 05/17/2014   HGB 14.5 05/17/2014   HCT 42.3 05/17/2014   PLT 252 05/17/2014   GLUCOSE 103* 08/02/2014   CHOL 265* 08/02/2014   TRIG 242* 08/02/2014   HDL 50 08/02/2014   LDLDIRECT 161.6 03/21/2014   LDLCALC 167* 08/02/2014   ALT 18 08/02/2014   AST 22 08/02/2014   NA 139 08/02/2014   K 4.8 08/02/2014   CL 102 08/02/2014   CREATININE 0.75 08/02/2014   BUN 15 08/02/2014   CO2 28 08/02/2014   TSH 0.14* 03/21/2014   INR 0.92 05/17/2014    Assessment / Plan: 1. Coronary disease status post stenting of the proximal LAD in September 2014 with drug-eluting stent. 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 Cardiac cath in November 2015 showed continued stent patency.  Continue her other  antianginal therapy.   2. Hyperlipidemia. History of intolerance to multiple statins, Zetia, and Lopid. Work on dietary modifications. Not a candidate for bile acid sequestrants with high triglycerides. Will give her a trial of fenofibrate 160 mg daily. Plan repeat lab in 3 months. If unable to tolerate will refer to lipid clinic. May be a candidate for PCSK9 inhibitor.    3. Hypertension- fair control. Continue Rx.  4. Hypothyroidism.

## 2014-08-09 NOTE — Patient Instructions (Signed)
We will add fenofibrate 160 mg daily for cholesterol  We will repeat lab work in 3 months.  Continue your other therapy

## 2014-08-28 ENCOUNTER — Other Ambulatory Visit: Payer: Self-pay | Admitting: Internal Medicine

## 2014-08-28 DIAGNOSIS — Z1231 Encounter for screening mammogram for malignant neoplasm of breast: Secondary | ICD-10-CM

## 2014-09-06 ENCOUNTER — Ambulatory Visit (HOSPITAL_COMMUNITY)
Admission: RE | Admit: 2014-09-06 | Discharge: 2014-09-06 | Disposition: A | Discharge status: Home / Self Care | Payer: Medicare Other | Source: Ambulatory Visit | Attending: Internal Medicine | Admitting: Internal Medicine

## 2014-09-06 DIAGNOSIS — Z1231 Encounter for screening mammogram for malignant neoplasm of breast: Secondary | ICD-10-CM | POA: Insufficient documentation

## 2014-09-18 ENCOUNTER — Ambulatory Visit: Payer: Medicare Other | Admitting: Internal Medicine

## 2014-09-20 ENCOUNTER — Ambulatory Visit (INDEPENDENT_AMBULATORY_CARE_PROVIDER_SITE_OTHER): Payer: Medicare Other | Admitting: Internal Medicine

## 2014-09-20 DIAGNOSIS — I10 Essential (primary) hypertension: Secondary | ICD-10-CM

## 2014-09-20 NOTE — Progress Notes (Signed)
Patient ID: [name], female   DOB: [birth year], 74 y.o.   MRN: [id number] [facility] arrived ans signed in for 3:30 appt at 3:20.  However, staff dd not notice her until 3:55pm and did not check her in so she decided to reschedule.

## 2014-09-24 NOTE — Assessment & Plan Note (Signed)
Patient rescheduled

## 2014-10-17 ENCOUNTER — Encounter: Payer: Self-pay | Admitting: Internal Medicine

## 2014-10-17 ENCOUNTER — Encounter: Payer: Self-pay | Admitting: Nurse Practitioner

## 2014-10-17 ENCOUNTER — Ambulatory Visit (INDEPENDENT_AMBULATORY_CARE_PROVIDER_SITE_OTHER): Payer: Medicare Other | Admitting: Nurse Practitioner

## 2014-10-17 VITALS — BP 120/62 | HR 75 | Temp 97.6°F | Resp 14 | Ht 64.0 in | Wt 147.8 lb

## 2014-10-17 DIAGNOSIS — K625 Hemorrhage of anus and rectum: Secondary | ICD-10-CM | POA: Diagnosis not present

## 2014-10-17 DIAGNOSIS — K921 Melena: Secondary | ICD-10-CM | POA: Insufficient documentation

## 2014-10-17 LAB — COMPREHENSIVE METABOLIC PANEL
ALBUMIN: 3.7 g/dL (ref 3.5–5.2)
ALT: 13 U/L (ref 0–35)
AST: 18 U/L (ref 0–37)
Alkaline Phosphatase: 37 U/L — ABNORMAL LOW (ref 39–117)
BUN: 18 mg/dL (ref 6–23)
CO2: 27 mEq/L (ref 19–32)
Calcium: 9 mg/dL (ref 8.4–10.5)
Chloride: 103 mEq/L (ref 96–112)
Creatinine, Ser: 0.82 mg/dL (ref 0.40–1.20)
GFR: 72.37 mL/min (ref >60.00)
Glucose, Bld: 152 mg/dL — ABNORMAL HIGH (ref 70–99)
POTASSIUM: 4 meq/L (ref 3.5–5.1)
SODIUM: 136 meq/L (ref 135–145)
TOTAL PROTEIN: 6.3 g/dL (ref 6.0–8.3)
Total Bilirubin: 0.4 mg/dL (ref 0.2–1.2)

## 2014-10-17 LAB — CBC WITH DIFFERENTIAL/PLATELET
BASOS PCT: 0.5 % (ref 0.0–3.0)
Basophils Absolute: 0 10*3/uL (ref 0.0–0.1)
EOS ABS: 0.2 10*3/uL (ref 0.0–0.7)
Eosinophils Relative: 2.1 % (ref 0.0–5.0)
HEMATOCRIT: 33.1 % — AB (ref 36.0–46.0)
Hemoglobin: 11.2 g/dL — ABNORMAL LOW (ref 12.0–15.0)
Lymphocytes Relative: 34.8 % (ref 12.0–46.0)
Lymphs Abs: 2.8 10*3/uL (ref 0.7–4.0)
MCHC: 33.9 g/dL (ref 30.0–36.0)
MCV: 83.5 fl (ref 78.0–100.0)
MONO ABS: 0.4 10*3/uL (ref 0.1–1.0)
Monocytes Relative: 5.1 % (ref 3.0–12.0)
NEUTROS PCT: 57.5 % (ref 43.0–77.0)
Neutro Abs: 4.6 10*3/uL (ref 1.4–7.7)
PLATELETS: 342 10*3/uL (ref 150.0–400.0)
RBC: 3.97 Mil/uL (ref 3.87–5.11)
RDW: 13.9 % (ref 11.5–15.5)
WBC: 7.9 10*3/uL (ref 4.0–10.5)

## 2014-10-17 NOTE — Assessment & Plan Note (Signed)
Urgent referral to GI °

## 2014-10-17 NOTE — Progress Notes (Signed)
Subjective:    Patient ID: [facility], female    DOB: 02/09/1941, 75 y.o.   MRN: [id number]  HPI  Veronica Barron is a 74 yo female with a CC of rectal bleeding.   1) Started last night after dinner. She reports toilet bowl full of blood with BM, same thing x 4-5 total last night loose and watery. Little bit in toilet this morning. Loose this morning. Stool was dark each time.  Ate- Breakfast- oatmeal, apple, orange juice Dinner-Steak and baked potato, toast, crackers, and salad tea   No other abnormal bleeding from gums or cuts   Lower abdominal pain mild yesterday, resolved today.   Denies seeing blood in underwear. Only with stool. Improved today.   No treatment to date.   Review of Systems  Gastrointestinal: Positive for diarrhea and blood in stool. Negative for nausea, vomiting, abdominal pain and constipation.  Genitourinary: Negative for dysuria.  Neurological: Negative for dizziness, weakness and light-headedness.  Hematological: Bruises/bleeds easily.       On plavix   Past Medical History  Diagnosis Date  . Leiomyoma of uterus, unspecified   . Unspecified hypothyroidism   . Hypertension   . Hyperlipidemia   . Allergic rhinitis, cause unspecified   . Diverticulosis of colon   . CAD (coronary artery disease)     a. 03/2013 Abnl MV, EF 29%;  b. 03/2013 Cath/PCI: >M nl, LAD 99p (2.5x28 Promus Prem DES), LCX 100 after large OM1, RCA dominant, 20p, EF 30%.  . Ischemic cardiomyopathy     a. 03/2013 EF 30% by LV gram.;  b. Echo (12/14): Mod LVH with severe basal septal hypertrophy w/o LVOT obstr, EF 50-55%, normal wall motion, Gr 1 DD, MAC, mild MR, mild LAE, trivial eff.    History   Social History  . Marital Status: Married    Spouse Name: N/A  . Number of Children: 3  . Years of Education: N/A   Occupational History  .     Social History Main Topics  . Smoking status: Never Smoker   . Smokeless tobacco: Never Used  . Alcohol Use: Yes     Comment: wine  .  Drug Use: No  . Sexual Activity: Yes    Birth Control/ Protection: Post-menopausal   Other Topics Concern  . 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    Past Surgical History  Procedure Laterality Date  . Tubal ligation    . Cholecystectomy    . Abdominal hysterectomy      fibroid   at  age 33  . Left heart catheterization with coronary angiogram N/A 03/16/2013    Procedure: LEFT HEART CATHETERIZATION WITH CORONARY ANGIOGRAM;  Surgeon: [name] [name], MD;  Location: [facility] CATH LAB;  Service: Cardiovascular;  Laterality: N/A;  . Percutaneous coronary stent intervention (pci-s)  03/16/2013    Procedure: PERCUTANEOUS CORONARY STENT INTERVENTION (PCI-S);  Surgeon: [name] [name], MD;  Location: [facility] CATH LAB;  Service: Cardiovascular;;  . Left heart catheterization with coronary angiogram N/A 05/23/2014    Procedure: LEFT HEART CATHETERIZATION WITH CORONARY ANGIOGRAM;  Surgeon: [name] [name], MD;  Location: [facility] CATH LAB;  Service: Cardiovascular;  Laterality: N/A;    Family History  Problem Relation Age of Onset  . Dementia Mother   . Hyperlipidemia Mother   . Hypertension Mother   . Coronary artery  disease Father   . Hypertension Father   . Hyperlipidemia Father   . Cancer Sister 75    uterine cancer     Allergies  Allergen Reactions  . Diphenhydramine Hcl     REACTION: TACHYCARDIA  . Statins     Myalgia   . Zetia [Ezetimibe] Other (See Comments)    Myalgias     Current Outpatient Prescriptions on File Prior to Visit  Medication Sig Dispense Refill  . ALFALFA PO Take 1 tablet by mouth daily.    Marland Kitchen aspirin EC 81 MG tablet Take 81 mg by mouth daily.    . calcium carbonate (OS-CAL) 600 MG TABS Take 600 mg by mouth daily.    . carvedilol (COREG) 6.25 MG tablet Take 1 tablet (6.25 mg total) by mouth 2 (two) times daily with a meal. 180 tablet 3  .  clopidogrel (PLAVIX) 75 MG tablet Take 1 tablet (75 mg total) by mouth daily. 90 tablet 3  . ergocalciferol (DRISDOL) 50000 UNITS capsule Take 1 capsule (50,000 Units total) by mouth once a week. 4 capsule 0  . fenofibrate 160 MG tablet Take 1 tablet (160 mg total) by mouth daily. 30 tablet 11  . levothyroxine (SYNTHROID, LEVOTHROID) 88 MCG tablet Take 1 tablet (88 mcg total) by mouth daily before breakfast. 90 tablet 2  . lisinopril (PRINIVIL,ZESTRIL) 5 MG tablet Take 1 tablet (5 mg total) by mouth 2 (two) times daily. 180 tablet 3  . multivitamin-lutein (OCUVITE-LUTEIN) CAPS capsule Take 1 capsule by mouth daily.    . nitroGLYCERIN (NITROSTAT) 0.4 MG SL tablet Place 1 tablet (0.4 mg total) under the tongue every 5 (five) minutes as needed for chest pain. 50 tablet 3  . sertraline (ZOLOFT) 100 MG tablet Take 0.5 tablets (50 mg total) by mouth daily. 90 tablet 1  . Tdap (BOOSTRIX) 5-2.5-18.5 LF-MCG/0.5 injection Inject 0.5 mLs into the muscle once. 0.5 mL 0  . vitamin B-12 (CYANOCOBALAMIN) 500 MCG tablet Take 500 mcg by mouth daily.    . vitamin C (ASCORBIC ACID) 500 MG tablet Take 500 mg by mouth daily.     No current facility-administered medications on file prior to visit.      Objective:   Physical Exam  Constitutional: She is oriented to person, place, and time. She appears well-developed and well-nourished. No distress.  BP 120/62 mmHg  Pulse 75  Temp(Src) 97.6 F (36.4 C) (Oral)  Resp 14  Ht 5\' 4"  (1.626 m)  Wt 147 lb 12.8 oz (67.042 kg)  BMI 25.36 kg/m2  SpO2 99%   HENT:  Head: Normocephalic and atraumatic.  Right Ear: External ear normal.  Left Ear: External ear normal.  Cardiovascular: Normal rate, regular rhythm, normal heart sounds and intact distal pulses.  Exam reveals no gallop and no friction rub.   No murmur heard. Pulmonary/Chest: Effort normal and breath sounds normal. No respiratory distress. She has no wheezes. She has no rales. She exhibits no tenderness.    Abdominal: Soft. Bowel sounds are normal. She exhibits no distension and no mass. There is no tenderness. There is no rebound and no guarding.  Neurological: She is alert and oriented to person, place, and time. No cranial nerve deficit. She exhibits normal muscle tone. Coordination normal.  Skin: Skin is warm and dry. No rash noted. She is not diaphoretic.  Psychiatric: She has a normal mood and affect. Her behavior is normal. Judgment and thought content normal.      Assessment & Plan:

## 2014-10-17 NOTE — Progress Notes (Signed)
Pre visit review using our clinic review tool, if applicable. No additional management support is needed unless otherwise documented below in the visit note. 

## 2014-10-17 NOTE — Patient Instructions (Signed)
Please visit the lab before leaving today.   We will contact you with your referral information.

## 2014-10-19 ENCOUNTER — Telehealth: Payer: Self-pay | Admitting: Cardiology

## 2014-10-19 NOTE — Telephone Encounter (Signed)
Returned call to patient Dr.Jordan advised to stop Plavix and proceed with GI work up.Advised to call back if needed.

## 2014-10-19 NOTE — Telephone Encounter (Signed)
I would recommend she stop Plavix and proceed with Gi work up.  Dishon Kehoe [name] MD, [facility]

## 2014-10-19 NOTE — Addendum Note (Signed)
Addended by: Golden Hurter D on: 10/19/2014 04:55 PM   Modules accepted: Orders, Medications

## 2014-10-19 NOTE — Telephone Encounter (Signed)
Pt says she is passing a lot of blood from her rectum. She wants to know if she should stop taking her Plavix?

## 2014-10-19 NOTE — Telephone Encounter (Signed)
Returned call to patient she stated she started having bright red rectal bleeding after every bowel movement since this past Monday 10/16/14.Stated now she is having dark red bleeding after bowel movement and seems a little better.She saw her PCP this past Tue and he will be scheduling a colonoscopy.She wanted to ask Dr.Jordan if she needs to keep taking Plavix.Message sent to [place].

## 2014-10-24 ENCOUNTER — Telehealth: Payer: Self-pay | Admitting: *Deleted

## 2014-10-24 ENCOUNTER — Encounter: Payer: Self-pay | Admitting: Nurse Practitioner

## 2014-10-24 ENCOUNTER — Ambulatory Visit (INDEPENDENT_AMBULATORY_CARE_PROVIDER_SITE_OTHER): Payer: Medicare Other | Admitting: Nurse Practitioner

## 2014-10-24 VITALS — BP 120/76 | HR 66 | Ht 64.0 in | Wt 148.2 lb

## 2014-10-24 DIAGNOSIS — K625 Hemorrhage of anus and rectum: Secondary | ICD-10-CM

## 2014-10-24 DIAGNOSIS — K5900 Constipation, unspecified: Secondary | ICD-10-CM | POA: Diagnosis not present

## 2014-10-24 MED ORDER — HYDROCORTISONE 2.5 % RE CREA: 1.0000 "application " | TOPICAL_CREAM | Freq: Every day | RECTAL | Status: DC

## 2014-10-24 NOTE — Telephone Encounter (Signed)
OK to hold Plavix for GI evaluation.   Arsh Feutz [name] MD, [facility]

## 2014-10-24 NOTE — Telephone Encounter (Signed)
  10/24/2014   RE: [facility] DOB: 05-Jan-1941 MRN: [id number]   Dear  Geanie Cooley   We have scheduled the above patient for an endoscopic procedure. Our records show that she is on anticoagulation therapy.   Please advise as to how long the patient may come off her therapy of Plavix  prior to the procedure, which is scheduled for 11/17/2014.  Please fax back/ or route the completed form to [place] or [place] at [phone].   Sincerely,    Genella Mech

## 2014-10-24 NOTE — Progress Notes (Signed)
HPI :  Patient is a 74 year old female, referred by PCP for evaluation of rectal bleeding. Last Monday patient developed rectal bleeding with bowel movements. She had been constipated and thinks it was caused by fenofibrate. Patient bled his bowel movements Monday through Wednesday. She discontinued fenofibrate, stools returned to normal, no bleeding since. The bleeding was painless. Patient is on Plavix for history of stents September 2014. Patient has never had a problem with constipation before. She eats high fiber foods.    Past Medical History  Diagnosis Date  . Leiomyoma of uterus, unspecified   . Unspecified hypothyroidism   . Hypertension   . Hyperlipidemia   . Allergic rhinitis, cause unspecified   . Diverticulosis of colon   . CAD (coronary artery disease)     a. 03/2013 Abnl MV, EF 29%;  b. 03/2013 Cath/PCI: >M nl, LAD 99p (2.5x28 Promus Prem DES), LCX 100 after large OM1, RCA dominant, 20p, EF 30%.  . Ischemic cardiomyopathy     a. 03/2013 EF 30% by LV gram.;  b. Echo (12/14): Mod LVH with severe basal septal hypertrophy w/o LVOT obstr, EF 50-55%, normal wall motion, Gr 1 DD, MAC, mild MR, mild LAE, trivial eff.     Family History  Problem Relation Age of Onset  . Dementia Mother   . Hyperlipidemia Mother   . Hypertension Mother   . Coronary artery disease Father   . Hypertension Father   . Hyperlipidemia Father   . Cancer Sister 44    uterine cancer    History  Substance Use Topics  . Smoking status: Never Smoker   . Smokeless tobacco: Never Used  . Alcohol Use: Yes     Comment: wine   Current Outpatient Prescriptions  Medication Sig Dispense Refill  . ALFALFA PO Take 1 tablet by mouth daily.    Marland Kitchen aspirin EC 81 MG tablet Take 81 mg by mouth daily.    . calcium carbonate (OS-CAL) 600 MG TABS Take 600 mg by mouth daily.    . carvedilol (COREG) 6.25 MG tablet Take 1 tablet (6.25 mg total) by mouth 2 (two) times daily with a meal. 180 tablet 3  . clopidogrel  (PLAVIX) 75 MG tablet Take 75 mg by mouth daily.    . ergocalciferol (DRISDOL) 50000 UNITS capsule Take 1 capsule (50,000 Units total) by mouth once a week. 4 capsule 0  . fenofibrate 160 MG tablet Take 1 tablet (160 mg total) by mouth daily. 30 tablet 11  . levothyroxine (SYNTHROID, LEVOTHROID) 88 MCG tablet Take 1 tablet (88 mcg total) by mouth daily before breakfast. 90 tablet 2  . lisinopril (PRINIVIL,ZESTRIL) 5 MG tablet Take 1 tablet (5 mg total) by mouth 2 (two) times daily. 180 tablet 3  . multivitamin-lutein (OCUVITE-LUTEIN) CAPS capsule Take 1 capsule by mouth daily.    . nitroGLYCERIN (NITROSTAT) 0.4 MG SL tablet Place 1 tablet (0.4 mg total) under the tongue every 5 (five) minutes as needed for chest pain. 50 tablet 3  . sertraline (ZOLOFT) 100 MG tablet Take 0.5 tablets (50 mg total) by mouth daily. 90 tablet 1  . Tdap (BOOSTRIX) 5-2.5-18.5 LF-MCG/0.5 injection Inject 0.5 mLs into the muscle once. 0.5 mL 0  . vitamin B-12 (CYANOCOBALAMIN) 500 MCG tablet Take 500 mcg by mouth daily.    . vitamin C (ASCORBIC ACID) 500 MG tablet Take 500 mg by mouth daily.     No current facility-administered medications for this visit.   Allergies  Allergen Reactions  .  Diphenhydramine Hcl     REACTION: TACHYCARDIA  . Statins     Myalgia   . Zetia [Ezetimibe] Other (See Comments)    Myalgias      Review of Systems: All systems reviewed and negative except where noted in HPI.   Physical Exam: BP 120/76 mmHg  Pulse 66  Ht 5\' 4"  (1.626 m)  Wt 148 lb 3.2 oz (67.223 kg)  BMI 25.43 kg/m2 Constitutional: Pleasant,well-developed, white female in no acute distress. HEENT: Normocephalic and atraumatic. Conjunctivae are normal. No scleral icterus. Neck supple.  Cardiovascular: Normal rate, regular rhythm.  Pulmonary/chest: Effort normal and breath sounds normal. No wheezing, rales or rhonchi. Abdominal: Soft, nondistended, nontender. Bowel sounds active throughout. There are no masses  palpable. No hepatomegaly. Rectal: no external lesions. On DRE there was soft brown stool. Areas of thickening in anal canal (suspect hemorrhoidal tissue) Extremities: no edema Lymphadenopathy: No cervical adenopathy noted. Neurological: Alert and oriented to person place and time. Skin: Skin is warm and dry. No rashes noted. Psychiatric: Normal mood and affect. Behavior is normal.   ASSESSMENT AND PLAN:   51. 74 year old female with recent rectal bleeding on Plavix. Bleeding resolved after BMs returned to normal off fenofibrate. Bleeding probably related to constipation (hemorrhoids) need to exclude other etiologies. Patient's last colonoscopy was March 2007 she is due for repeat colonoscopy anyway. The risks, benefits, and alternatives to colonoscopy with possible biopsy and possible polypectomy were discussed with the patient and she consents to proceed. Hold  Plavix 5 days before procedure - will instruct when and how to resume after procedure.  Will communicate by phone or EMR with patient's prescribing provider to confirm holding Plavix is reasonable in this case.   2. Constipation, resolved. Bowel movements changed after starting fenofibrate. After discontinuation of the medication bowel movements have returned to normal and no further bleeding. I advised patient to talk to whomever prescribed fenofibrate, there may be an alternative medication for her. Patient states she cannot tolerate statins. Another option is to resume fenofibrate but treat her for constipation. Recommended MiraLAX 1 capful daily when necessary.   3. CAD, stent placement 2014. Ejection fraction 50-55% on last echo December 2014. On Plavix.    CC: Lorane Gell, NP         Deborra Medina, MD

## 2014-10-24 NOTE — Patient Instructions (Signed)
You have been scheduled for a colonoscopy. Please follow written instructions given to you at your visit today.  Please pick up your prep supplies at the pharmacy within the next 1-3 days. If you use inhalers (even only as needed), please bring them with you on the day of your procedure. Your physician has requested that you go to www.startemmi.com and enter the access code given to you at your visit today. This web site gives a general overview about your procedure. However, you should still follow specific instructions given to you by our office regarding your preparation for the procedure.  Use Miralax daily as needed We have sent in a new prescription to your pharmacy

## 2014-10-24 NOTE — Addendum Note (Signed)
Addended by: Oda Kilts on: 10/24/2014 11:08 AM   Modules accepted: Orders

## 2014-10-25 NOTE — Assessment & Plan Note (Signed)
Urgent referral to Gastroenterology, CBC w/ diff and CMET today. Will ask nurse to call tomorrow and check on pt. Discussed ER visit and pt does not want to go. Pt understands risks. Will follow.

## 2014-10-26 ENCOUNTER — Telehealth: Payer: Self-pay | Admitting: Internal Medicine

## 2014-10-26 NOTE — Telephone Encounter (Signed)
Left a message for patient to return my call. 

## 2014-10-26 NOTE — Telephone Encounter (Signed)
Told patient we sent Proctozone cream to her pharmacy because the Suppositories were not covered by her insurance. I advised her to try them for a week and call us with a progress report.She thanked me for the information.

## 2014-11-01 NOTE — Progress Notes (Signed)
Agree with Ms. Guenther's assessment and plan. Nabilah Davoli E. Sankalp Ferrell, MD, FACG   

## 2014-11-02 ENCOUNTER — Telehealth: Payer: Self-pay

## 2014-11-02 NOTE — Telephone Encounter (Signed)
Received a call from patient 10/30/14 wanting to know if ok to hold plavix before colonoscopy.Also stated Fenofibrate causing constipation.Spoke to [place] he advised ok to hold Plavix prior to colonoscopy and ok to stop Fenofibrate.

## 2014-11-03 NOTE — Telephone Encounter (Signed)
Im sorry Dr [name], But how many days? 5 or 7   Thank You

## 2014-11-04 NOTE — Telephone Encounter (Signed)
Hold Plavix for 7 days before GI procedure.  Yao Hyppolite [name] MD, [facility]

## 2014-11-06 NOTE — Telephone Encounter (Signed)
CALLED PT TO INFORM OK TO HOLD PLAVIX 7 DAYS    PT UNDERSTANDS

## 2014-11-07 ENCOUNTER — Encounter: Payer: Self-pay | Admitting: Internal Medicine

## 2014-11-10 ENCOUNTER — Other Ambulatory Visit: Payer: Self-pay

## 2014-11-10 DIAGNOSIS — E785 Hyperlipidemia, unspecified: Secondary | ICD-10-CM

## 2014-11-10 LAB — BASIC METABOLIC PANEL
BUN: 16 mg/dL (ref 6–23)
CHLORIDE: 104 meq/L (ref 96–112)
CO2: 26 meq/L (ref 19–32)
CREATININE: 0.68 mg/dL (ref 0.50–1.10)
Calcium: 9.1 mg/dL (ref 8.4–10.5)
GLUCOSE: 100 mg/dL — AB (ref 70–99)
POTASSIUM: 4.5 meq/L (ref 3.5–5.3)
SODIUM: 139 meq/L (ref 135–145)

## 2014-11-10 LAB — LIPID PANEL
CHOLESTEROL: 238 mg/dL — AB (ref 0–200)
HDL: 48 mg/dL (ref >=46)
LDL Cholesterol: 154 mg/dL — ABNORMAL HIGH (ref 0–99)
Total CHOL/HDL Ratio: 5 Ratio
Triglycerides: 180 mg/dL — ABNORMAL HIGH (ref <150)
VLDL: 36 mg/dL (ref 0–40)

## 2014-11-10 LAB — HEPATIC FUNCTION PANEL
ALK PHOS: 42 U/L (ref 39–117)
ALT: 12 U/L (ref 0–35)
AST: 19 U/L (ref 0–37)
Albumin: 4.2 g/dL (ref 3.5–5.2)
Bilirubin, Direct: 0.1 mg/dL (ref 0.0–0.3)
Indirect Bilirubin: 0.4 mg/dL (ref 0.2–1.2)
Total Bilirubin: 0.5 mg/dL (ref 0.2–1.2)
Total Protein: 6.4 g/dL (ref 6.0–8.3)

## 2014-11-14 ENCOUNTER — Telehealth: Payer: Self-pay | Admitting: Cardiology

## 2014-11-14 NOTE — Telephone Encounter (Signed)
Closed encounter °

## 2014-11-15 ENCOUNTER — Telehealth: Payer: Self-pay | Admitting: Cardiology

## 2014-11-15 NOTE — Telephone Encounter (Signed)
Did not need to start a note  Close encounter

## 2014-11-17 ENCOUNTER — Encounter: Payer: Medicare Other | Admitting: Internal Medicine

## 2014-11-17 ENCOUNTER — Ambulatory Visit: Payer: Medicare Other | Admitting: Pharmacist Clinician (PhC)/ Clinical Pharmacy Specialist

## 2014-11-20 ENCOUNTER — Ambulatory Visit (INDEPENDENT_AMBULATORY_CARE_PROVIDER_SITE_OTHER): Payer: Medicare Other | Admitting: Pharmacist Clinician (PhC)/ Clinical Pharmacy Specialist

## 2014-11-20 VITALS — Ht 64.0 in | Wt 148.6 lb

## 2014-11-20 DIAGNOSIS — E785 Hyperlipidemia, unspecified: Secondary | ICD-10-CM | POA: Diagnosis not present

## 2014-11-20 NOTE — Patient Instructions (Signed)
It was nice to meet you today  Start with fish oil 2 capsules twice daily to help lower triglycerides  Start with Crestor 2.5 mg twice weekly (cut 5 mg tablet in 1/2)  Continue to follow a healthy diet and exercise regimen.  Call in 5-6 weeks and let me know how you're tolerating these medications.  Kristin at [phone] x [phone]

## 2014-11-21 ENCOUNTER — Encounter: Payer: Self-pay | Admitting: Pharmacist Clinician (PhC)/ Clinical Pharmacy Specialist

## 2014-11-21 MED ORDER — ROSUVASTATIN CALCIUM 5 MG PO TABS: ORAL_TABLET | ORAL | Status: DC

## 2014-11-21 MED ORDER — OMEGA-3-ACID ETHYL ESTERS 1 G PO CAPS: 2.0000 g | ORAL_CAPSULE | Freq: Two times a day (BID) | ORAL | Status: DC

## 2014-11-21 NOTE — Assessment & Plan Note (Signed)
Veronica Barron has a history of myalgias with 4 different statins including pravastatin, simvastatin, atorvastatin and rosuvastatin.  She has also had problems with both gemfibrozil and fenofibrate.   We had a long discussion about PCSK-9 inhibitors and other options for lowering cholesterol.  For now she is agreeable to starting fish oil and Crestor 2.5 mg twice weekly.  She was uncomfortable in taking a whole 5 mg tablet, even once weekly.  She is to call me in 6 weeks to let me know if she has been able to tolerate these medications.  At that time we will schedule repeat labs and decide if she will need a PCSK-9.

## 2014-11-21 NOTE — Progress Notes (Signed)
11/21/2014 ZEPHANIAH ENYEART Jul 02, 1941 [id number]   HPI:  Veronica Barron is a 74 y.o. female patient of Dr. [name], who presents today for a lipid clinic evaluation.    RF:  Hypertension, CAD (DES to LAD 03/2013) Meds: none   Intolerant: zetia - myalgias;  Fenofibrate - constipation; gemfibrozil - GI upset; atorvastatin (2000-2001) - myalgias; pravastatin 40 (03/2013) - myalgias; rosuvastatin 5 mg (Sept-Oct 2014) - myalgias; simvastatin 5 mg (Oct-Nov 2014) - myalgias   Family history: father died from [place] at 61, 1 brother with stents; mother, 2 siblings, 1 child all with high cholesterol  Diet: oatmeal, raw fruits/vegetables, chicken, fish, some pasta and potatoes  Exercise: walks 30 minutes/day, occasional biking, does yard work    Labs:  07/2014 -  TC 265, TG 242, HDL 50, LDL 167 10/2014 -  TC 238, TG 180, HDL 48, LDL 154   Current Outpatient Prescriptions  Medication Sig Dispense Refill  . ALFALFA PO Take 1 tablet by mouth daily.    Marland Kitchen aspirin EC 81 MG tablet Take 81 mg by mouth daily.    . calcium carbonate (OS-CAL) 600 MG TABS Take 600 mg by mouth daily.    . carvedilol (COREG) 6.25 MG tablet Take 1 tablet (6.25 mg total) by mouth 2 (two) times daily with a meal. 180 tablet 3  . clopidogrel (PLAVIX) 75 MG tablet Take 75 mg by mouth daily.    . ergocalciferol (DRISDOL) 50000 UNITS capsule Take 1 capsule (50,000 Units total) by mouth once a week. 4 capsule 0  . hydrocortisone (PROCTOZONE-HC) 2.5 % rectal cream Place 1 application rectally at bedtime. For 7 days 30 g 0  . levothyroxine (SYNTHROID, LEVOTHROID) 88 MCG tablet Take 1 tablet (88 mcg total) by mouth daily before breakfast. 90 tablet 2  . lisinopril (PRINIVIL,ZESTRIL) 5 MG tablet Take 1 tablet (5 mg total) by mouth 2 (two) times daily. 180 tablet 3  . multivitamin-lutein (OCUVITE-LUTEIN) CAPS capsule Take 1 capsule by mouth daily.    . nitroGLYCERIN (NITROSTAT) 0.4 MG SL tablet Place 1 tablet (0.4 mg total) under the tongue every  5 (five) minutes as needed for chest pain. 50 tablet 3  . sertraline (ZOLOFT) 100 MG tablet Take 0.5 tablets (50 mg total) by mouth daily. 90 tablet 1  . Tdap (BOOSTRIX) 5-2.5-18.5 LF-MCG/0.5 injection Inject 0.5 mLs into the muscle once. 0.5 mL 0  . vitamin B-12 (CYANOCOBALAMIN) 500 MCG tablet Take 500 mcg by mouth daily.    . vitamin C (ASCORBIC ACID) 500 MG tablet Take 500 mg by mouth daily.     No current facility-administered medications for this visit.    Allergies  Allergen Reactions  . Diphenhydramine Hcl     REACTION: TACHYCARDIA  . Fenofibrate Other (See Comments)    constipation  . Statins     Myalgia   . Zetia [Ezetimibe] Other (See Comments)    Myalgias     Past Medical History  Diagnosis Date  . Leiomyoma of uterus, unspecified   . Unspecified hypothyroidism   . Hypertension   . Hyperlipidemia   . Allergic rhinitis, cause unspecified   . Diverticulosis of colon   . CAD (coronary artery disease)     a. 03/2013 Abnl MV, EF 29%;  b. 03/2013 Cath/PCI: >M nl, LAD 99p (2.5x28 Promus Prem DES), LCX 100 after large OM1, RCA dominant, 20p, EF 30%.  . Ischemic cardiomyopathy     a. 03/2013 EF 30% by LV gram.;  b. Echo (12/14): Mod LVH  with severe basal septal hypertrophy w/o LVOT obstr, EF 50-55%, normal wall motion, Gr 1 DD, MAC, mild MR, mild LAE, trivial eff.    Height 5\' 4"  (1.626 m), weight 148 lb 9.6 oz (67.405 kg).   Tommy Medal PharmD CPP [facility]

## 2014-11-24 NOTE — Progress Notes (Signed)
Veronica Barron -- I can't close this encounter.  [place]

## 2014-12-01 ENCOUNTER — Ambulatory Visit (AMBULATORY_SURGERY_CENTER): Payer: Medicare Other | Admitting: Internal Medicine

## 2014-12-01 ENCOUNTER — Encounter: Payer: Self-pay | Admitting: Internal Medicine

## 2014-12-01 VITALS — BP 148/79 | HR 67 | Temp 98.0°F | Resp 16 | Ht 64.0 in | Wt 148.0 lb

## 2014-12-01 DIAGNOSIS — K621 Rectal polyp: Secondary | ICD-10-CM | POA: Diagnosis not present

## 2014-12-01 DIAGNOSIS — K5730 Diverticulosis of large intestine without perforation or abscess without bleeding: Secondary | ICD-10-CM

## 2014-12-01 DIAGNOSIS — K625 Hemorrhage of anus and rectum: Secondary | ICD-10-CM | POA: Diagnosis not present

## 2014-12-01 DIAGNOSIS — D128 Benign neoplasm of rectum: Secondary | ICD-10-CM

## 2014-12-01 MED ORDER — SODIUM CHLORIDE 0.9 % IV SOLN: 500.0000 mL | INTRAVENOUS | Status: DC

## 2014-12-01 NOTE — Op Note (Signed)
[place]  [address] [place], [address]   COLONOSCOPY PROCEDURE REPORT  PATIENT: Veronica Barron, Veronica Barron  MR#: [id number] BIRTHDATE: 1941/02/17 , 42  yrs. old GENDER: female ENDOSCOPIST: Gatha Mayer, MD, [facility] PROCEDURE DATE:  12/01/2014 PROCEDURE:   Colonoscopy, diagnostic and Colonoscopy with snare polypectomy First Screening Colonoscopy - Avg.  risk and is 50 yrs.  old or older - No.  Prior Negative Screening - Now for repeat screening. N/A  History of Adenoma - Now for follow-up colonoscopy & has been > or = to 3 yrs.  N/A  Polyps removed today? Yes ASA CLASS:   Class III INDICATIONS:Evaluation of unexplained GI bleeding and Patient is not applicable for Colorectal Neoplasm Risk Assessment for this procedure. MEDICATIONS: Propofol 250 mg IV and Monitored anesthesia care  DESCRIPTION OF PROCEDURE:   After the risks benefits and alternatives of the procedure were thoroughly explained, informed consent was obtained.  The digital rectal exam revealed no abnormalities of the rectum.   The LB [id number]  endoscope was introduced through the anus and advanced to the cecum, which was identified by the ileocecal valve. No adverse events experienced.   Limited by a tortuous colon.   The quality of the prep was good.  (MiraLax was used)  The instrument was then slowly withdrawn as the colon was fully examined. Estimated blood loss is zero unless otherwise noted in this procedure report.      COLON FINDINGS: Two sessile polyps ranging from 4 to 86mm in size were found in the rectum.  Polypectomies were performed with a cold snare.  The resection was complete, the polyp tissue was completely retrieved and sent to histology.  Bleeding at the 10 mm site was controlled using hemoclips.  One (1) placement was made.  There was minimal blood loss from maneuver subsiding by end of procedure. There was severe diverticulosis noted in the sigmoid colon with associated  muscular hypertrophy, tortuosity, angulation and luminal narrowing.   This caused looping and I was umable to enter the cecum but saw it from outside. Abdominal pressure used. The examination was otherwise normal.  Retroflexed views revealed no abnormalities. The time to cecum = 11.4 Withdrawal time = 10.5 The scope was withdrawn and the procedure completed. COMPLICATIONS: There were no immediate complications.  ENDOSCOPIC IMPRESSION: 1.   Two sessile polyps ranging from 4 to 20mm in size were found in the rectum; polypectomies were performed with a cold snare; bleeding at the site was controlled using hemoclips - she was still on clopidogrel. 10 mm polyp was friable and I think cause of her bleeding. It was not photographed. 2.   There was severe diverticulosis noted in the sigmoid colon 3.   The examination was otherwise normal - good prep - cecum not entered  RECOMMENDATIONS: Await pathology results  eSigned:  Gatha Mayer, MD, [facility] 12/01/2014 3:29 PM   cc: Verita Schneiders, MD and The Patient

## 2014-12-01 NOTE — Patient Instructions (Addendum)
I found and removed 2 polyps from the rectum and I suspect one of them caused the bleeding. i could not examine the entire colon - did not see 5-10% well because of scarring in your colon that would not allow the scope to pass easily. There is severe diverticulosis there.  I will let you know pathology results by mail.  I appreciate the opportunity to care for you. Gatha Mayer, MD, FACG  YOU HAD AN ENDOSCOPIC PROCEDURE TODAY AT [facility]:   Refer to the procedure report that was given to you for any specific questions about what was found during the examination.  If the procedure report does not answer your questions, please call your gastroenterologist to clarify.  If you requested that your care partner not be given the details of your procedure findings, then the procedure report has been included in a sealed envelope for you to review at your convenience later.  YOU SHOULD EXPECT: Some feelings of bloating in the abdomen. Passage of more gas than usual.  Walking can help get rid of the air that was put into your GI tract during the procedure and reduce the bloating. If you had a lower endoscopy (such as a colonoscopy or flexible sigmoidoscopy) you may notice spotting of blood in your stool or on the toilet paper. If you underwent a bowel prep for your procedure, you may not have a normal bowel movement for a few days.  Please Note:  You might notice some irritation and congestion in your nose or some drainage.  This is from the oxygen used during your procedure.  There is no need for concern and it should clear up in a day or so.  SYMPTOMS TO REPORT IMMEDIATELY:   Following lower endoscopy (colonoscopy or flexible sigmoidoscopy):  Excessive amounts of blood in the stool  Significant tenderness or worsening of abdominal pains  Swelling of the abdomen that is new, acute  Fever of 100F or higher  For urgent or emergent issues, a gastroenterologist can be  reached at any hour by calling [phone].   DIET: Your first meal following the procedure should be a small meal and then it is ok to progress to your normal diet. Heavy or fried foods are harder to digest and may make you feel nauseous or bloated.  Likewise, meals heavy in dairy and vegetables can increase bloating.  Drink plenty of fluids but you should avoid alcoholic beverages for 24 hours.  ACTIVITY:  You should plan to take it easy for the rest of today and you should NOT DRIVE or use heavy machinery until tomorrow (because of the sedation medicines used during the test).    FOLLOW UP: Our staff will call the number listed on your records the next business day following your procedure to check on you and address any questions or concerns that you may have regarding the information given to you following your procedure. If we do not reach you, we will leave a message.  However, if you are feeling well and you are not experiencing any problems, there is no need to return our call.  We will assume that you have returned to your regular daily activities without incident.  If any biopsies were taken you will be contacted by phone or by letter within the next 1-3 weeks.  Please call us at [phone] if you have not heard about the biopsies in 3 weeks.    SIGNATURES/CONFIDENTIALITY: You and/or your care partner  have signed paperwork which will be entered into your electronic medical record.  These signatures attest to the fact that that the information above on your After Visit Summary has been reviewed and is understood.  Full responsibility of the confidentiality of this discharge information lies with you and/or your care-partner.

## 2014-12-01 NOTE — Progress Notes (Signed)
Called to room to assist during endoscopic procedure.  Patient ID and intended procedure confirmed with present staff. Received instructions for my participation in the procedure from the performing physician.  

## 2014-12-01 NOTE — Progress Notes (Signed)
Pt to PACU awake and alert. Pleased with MAC  Report to Rn

## 2014-12-01 NOTE — Progress Notes (Signed)
Pt states, she forgot to hold blood thinner x 5 dys per order. Denies bleeding or bruising Dr. Carlean Purl informed  With verbal ok to continue procedure

## 2014-12-04 ENCOUNTER — Telehealth: Payer: Self-pay

## 2014-12-04 NOTE — Telephone Encounter (Signed)
  Follow up Call-  Call back number 12/01/2014  Post procedure Call Back phone  # [phone]  Permission to leave phone message Yes     Patient questions:  Do you have a fever, pain , or abdominal swelling? No. Pain Score  0 *  Have you tolerated food without any problems? Yes.    Have you been able to return to your normal activities? Yes.    Do you have any questions about your discharge instructions: Diet   No. Medications  No. Follow up visit  No.  Do you have questions or concerns about your Care? No.  Actions: * If pain score is 4 or above: No action needed, pain <4.

## 2014-12-12 ENCOUNTER — Encounter: Payer: Self-pay | Admitting: Internal Medicine

## 2014-12-12 NOTE — Progress Notes (Signed)
Quick Note:  Hyperplastic and inflammatory rectal polyps - no routine repeat colonoscopy needed ______

## 2015-01-03 ENCOUNTER — Other Ambulatory Visit: Payer: Self-pay | Admitting: *Deleted

## 2015-01-03 MED ORDER — SERTRALINE HCL 100 MG PO TABS: 50.0000 mg | ORAL_TABLET | Freq: Every day | ORAL | Status: DC

## 2015-01-24 IMAGING — CR DG CHEST 2V
2 series · 2 of 2 positions shown · non-contrast
Comparison: CT 01/01/2012, chest x-ray 12/18/2011

CLINICAL DATA: Congestive heart failure, short of breath

CHEST - 2 VIEW

[w chest pa]
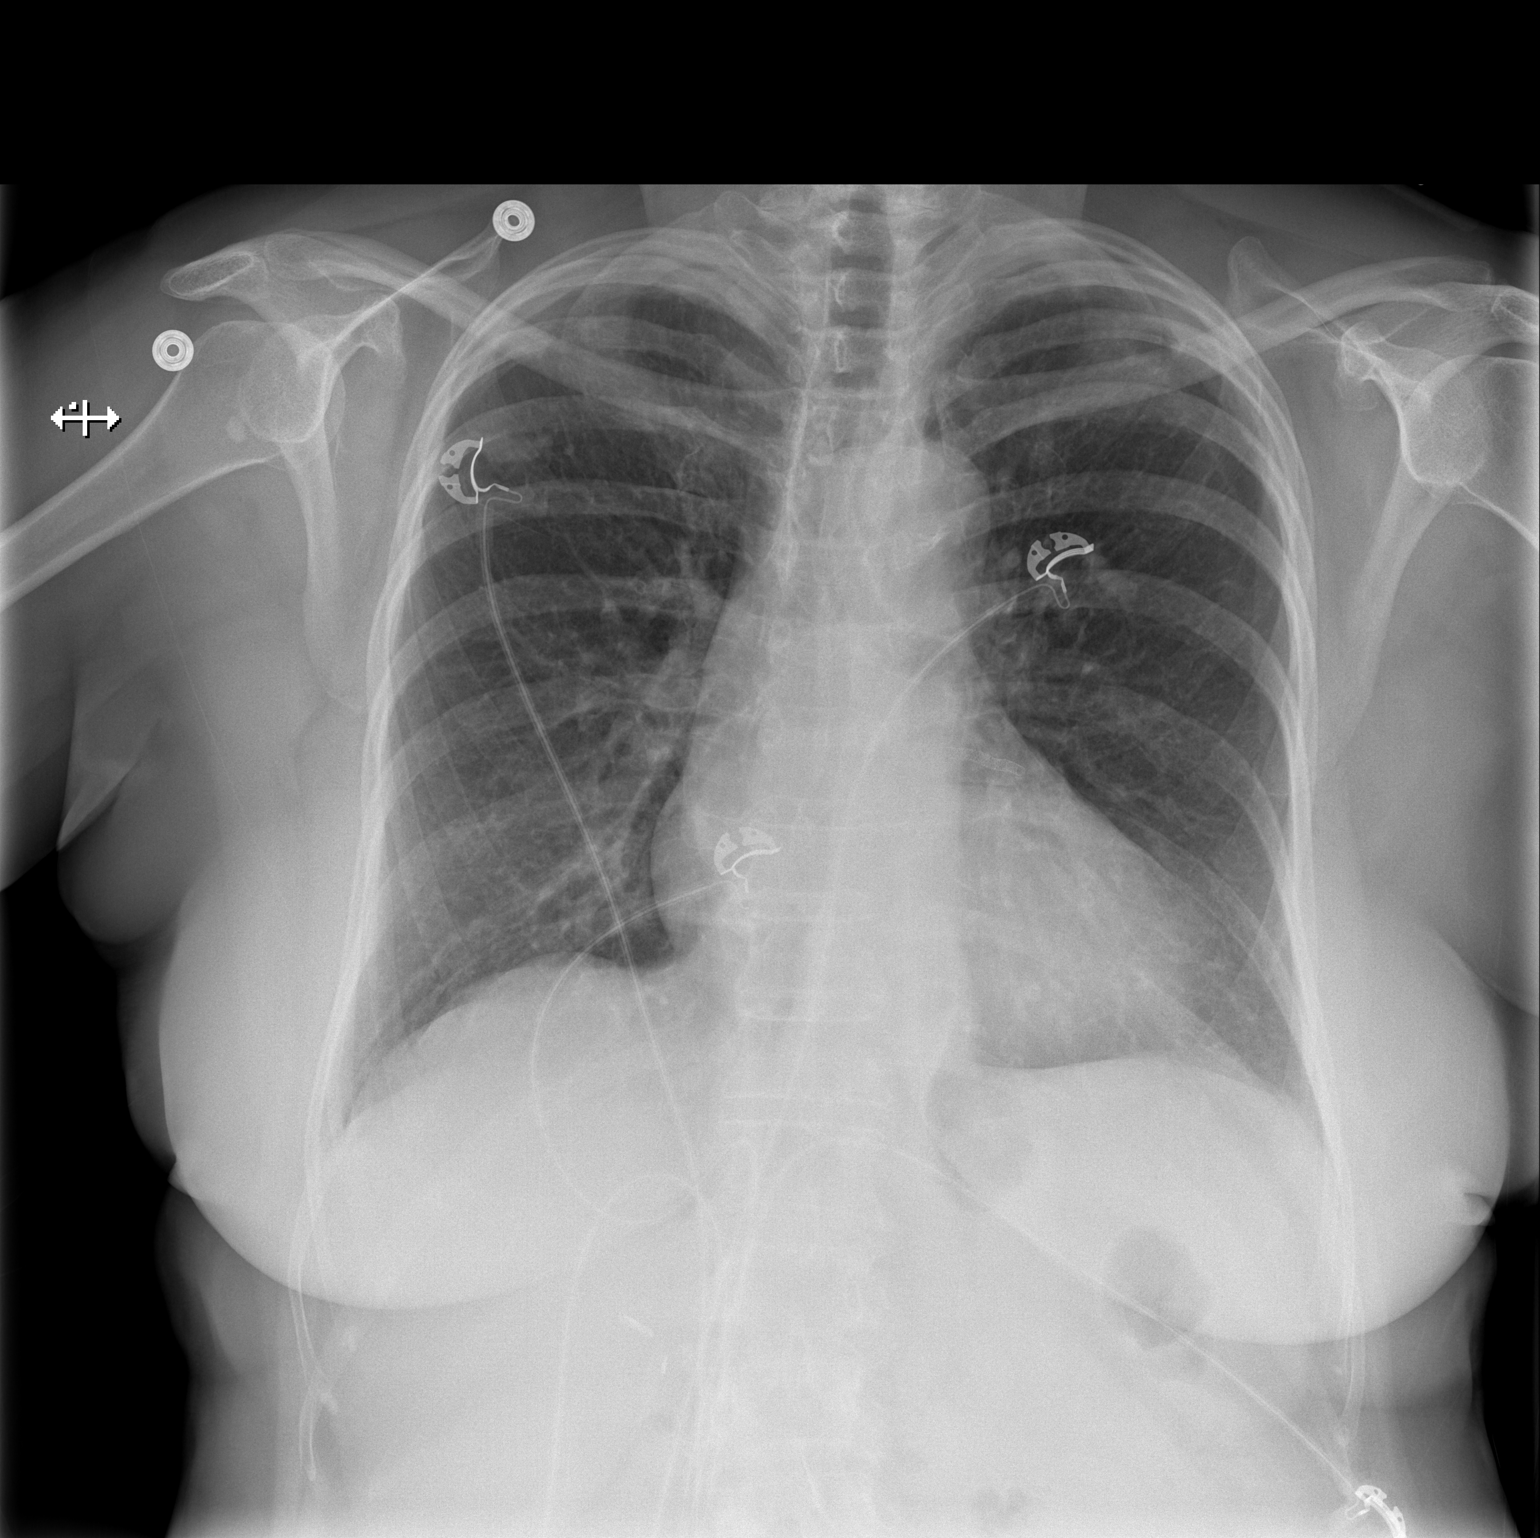

[w chest lat]
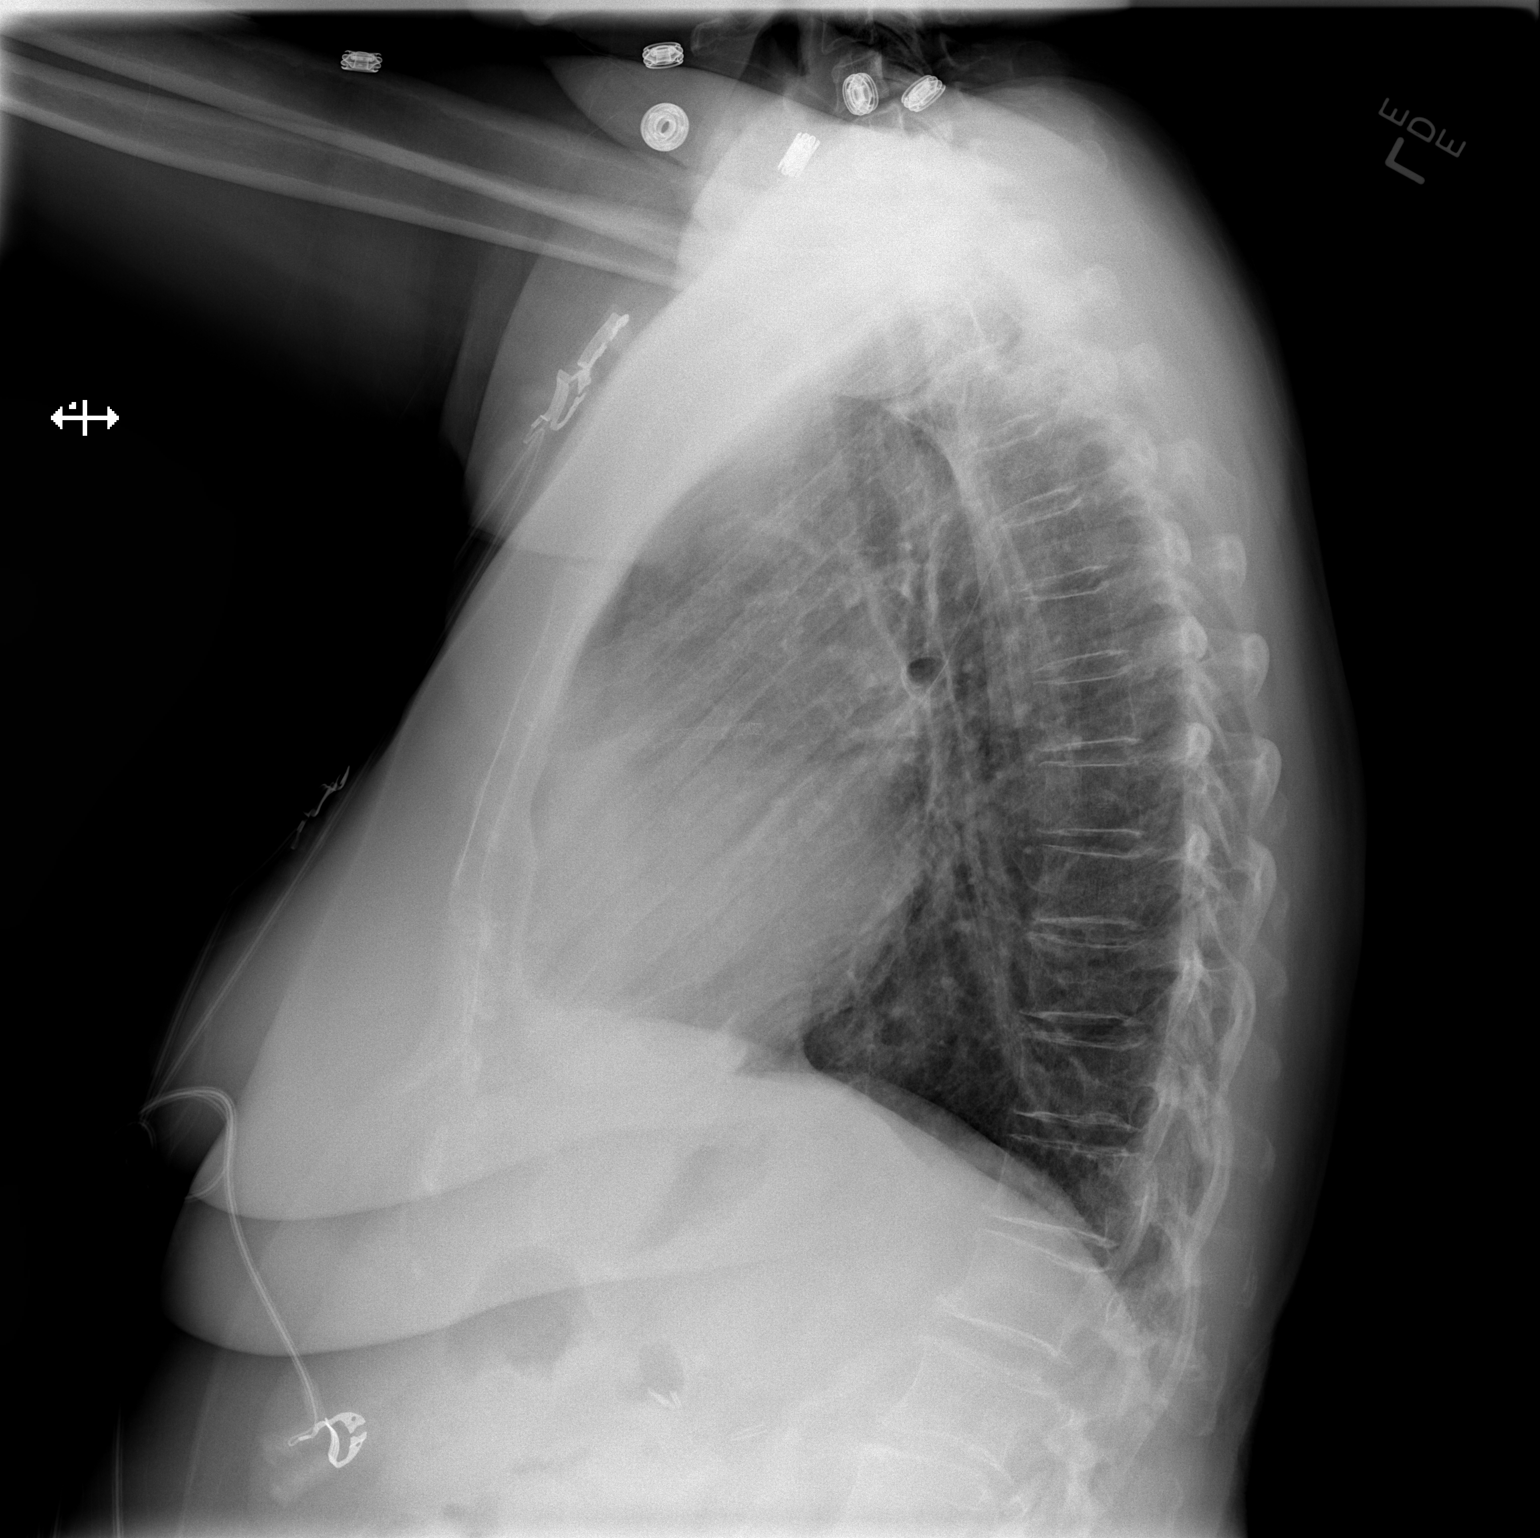

[2 of 2 positions shown; findings below may reference images not displayed]

FINDINGS: Normal cardiac silhouette.  No effusion, infiltrate, or
pneumothorax.  There is scarring at the left lung apex similar to
prior.
IMPRESSION: No acute cardiopulmonary process.

## 2015-02-01 ENCOUNTER — Telehealth: Payer: Self-pay | Admitting: Pharmacist Clinician (PhC)/ Clinical Pharmacy Specialist

## 2015-02-01 NOTE — Telephone Encounter (Signed)
Veronica Barron states you told her to call and give an update on her medications and how she was doing.

## 2015-02-01 NOTE — Telephone Encounter (Signed)
Spoke with patient, she was unable to tolerate crestor at 2.5 mg once weekly, due to muscle pains/myalgia.  Is able to tolerate fish oil at 1 capsule/daily, but no more than that.   Will review chart to see if she qualifies for PCSK-9 therapy and send in appropriate paperwork.  Pt in agreement, voiced understanding.

## 2015-02-19 ENCOUNTER — Encounter: Payer: Self-pay | Admitting: Cardiology

## 2015-02-19 ENCOUNTER — Ambulatory Visit (INDEPENDENT_AMBULATORY_CARE_PROVIDER_SITE_OTHER): Payer: Medicare Other | Admitting: Cardiology

## 2015-02-19 VITALS — BP 136/77 | HR 65 | Ht 64.0 in | Wt 149.3 lb

## 2015-02-19 DIAGNOSIS — I10 Essential (primary) hypertension: Secondary | ICD-10-CM

## 2015-02-19 DIAGNOSIS — E785 Hyperlipidemia, unspecified: Secondary | ICD-10-CM | POA: Diagnosis not present

## 2015-02-19 DIAGNOSIS — I2510 Atherosclerotic heart disease of native coronary artery without angina pectoris: Secondary | ICD-10-CM

## 2015-02-19 MED ORDER — CLOPIDOGREL BISULFATE 75 MG PO TABS: 75.0000 mg | ORAL_TABLET | Freq: Every day | ORAL | Status: DC

## 2015-02-19 NOTE — Progress Notes (Signed)
Veronica Barron Date of Birth: 11/26/1940 Medical Record #[id number]  History of Present Illness: Veronica Barron is seen today for followup CAD. She has a history of hypertension, hyperlipidemia, and hypothyroidism. In Sept 2014 she had stenting of the proximal LAD with DES.  The distal left circumflex was occluded with left to left collaterals and right-to-left collaterals. The right coronary was without significant disease. Ejection fraction was 30%.  Her other disease was treated medically. On followup Echo in Dec. 2014 EF had improved to 50-55%. In November 2015 she had a significant episode of chest pain. Repeat cardiac cath showed occlusion of the distal LCx and the stent in the LAD was patent. She has had no other chest pain.  She has a history of intolerance to multiple statins, Zetia and Lopid, and fenofibrates.   On follow up today she is feeling very well. No chest pain or SOB. Walks daily and does yard work.    Current Outpatient Prescriptions on File Prior to Visit  Medication Sig Dispense Refill  . ALFALFA PO Take 1 tablet by mouth daily.    Marland Kitchen aspirin EC 81 MG tablet Take 81 mg by mouth daily.    . calcium carbonate (OS-CAL) 600 MG TABS Take 600 mg by mouth daily.    . carvedilol (COREG) 6.25 MG tablet Take 1 tablet (6.25 mg total) by mouth 2 (two) times daily with a meal. 180 tablet 3  . hydrocortisone (PROCTOZONE-HC) 2.5 % rectal cream Place 1 application rectally at bedtime. For 7 days 30 g 0  . levothyroxine (SYNTHROID, LEVOTHROID) 88 MCG tablet Take 1 tablet (88 mcg total) by mouth daily before breakfast. 90 tablet 2  . lisinopril (PRINIVIL,ZESTRIL) 5 MG tablet Take 1 tablet (5 mg total) by mouth 2 (two) times daily. 180 tablet 3  . multivitamin-lutein (OCUVITE-LUTEIN) CAPS capsule Take 1 capsule by mouth daily.    . nitroGLYCERIN (NITROSTAT) 0.4 MG SL tablet Place 1 tablet (0.4 mg total) under the tongue every 5 (five) minutes as needed for chest pain. 50 tablet 3  . omega-3  acid ethyl esters (LOVAZA) 1 G capsule Take 2 capsules (2 g total) by mouth 2 (two) times daily. 120 capsule 5  . sertraline (ZOLOFT) 100 MG tablet Take 0.5 tablets (50 mg total) by mouth daily. 90 tablet 1  . vitamin B-12 (CYANOCOBALAMIN) 500 MCG tablet Take 500 mcg by mouth daily.    . vitamin C (ASCORBIC ACID) 500 MG tablet Take 500 mg by mouth daily.     No current facility-administered medications on file prior to visit.    Allergies  Allergen Reactions  . Diphenhydramine Hcl     REACTION: TACHYCARDIA  . Fenofibrate Other (See Comments)    constipation  . Statins     Myalgia   . Zetia [Ezetimibe] Other (See Comments)    Myalgias     Past Medical History  Diagnosis Date  . Leiomyoma of uterus, unspecified   . Unspecified hypothyroidism   . Hypertension   . Hyperlipidemia   . Allergic rhinitis, cause unspecified   . Diverticulosis of colon   . CAD (coronary artery disease)     a. 03/2013 Abnl MV, EF 29%;  b. 03/2013 Cath/PCI: >M nl, LAD 99p (2.5x28 Promus Prem DES), LCX 100 after large OM1, RCA dominant, 20p, EF 30%.  . Ischemic cardiomyopathy     a. 03/2013 EF 30% by LV gram.;  b. Echo (12/14): Mod LVH with severe basal septal hypertrophy w/o LVOT obstr, EF 50-55%,  normal wall motion, Gr 1 DD, MAC, mild MR, mild LAE, trivial eff.    Past Surgical History  Procedure Laterality Date  . Tubal ligation    . Cholecystectomy    . Abdominal hysterectomy      fibroid   at  age 61  . Left heart catheterization with coronary angiogram N/A 03/16/2013    Procedure: LEFT HEART CATHETERIZATION WITH CORONARY ANGIOGRAM;  Surgeon: [name] [name], MD;  Location: [facility] CATH LAB;  Service: Cardiovascular;  Laterality: N/A;  . Percutaneous coronary stent intervention (pci-s)  03/16/2013    Procedure: PERCUTANEOUS CORONARY STENT INTERVENTION (PCI-S);  Surgeon: [name] [name], MD;  Location: [facility] CATH LAB;  Service: Cardiovascular;;  . Left heart catheterization with coronary angiogram N/A 05/23/2014     Procedure: LEFT HEART CATHETERIZATION WITH CORONARY ANGIOGRAM;  Surgeon: [name] [name], MD;  Location: [facility] CATH LAB;  Service: Cardiovascular;  Laterality: N/A;    History  Smoking status  . Never Smoker   Smokeless tobacco  . Never Used    History  Alcohol Use  . Yes    Comment: wine    Family History  Problem Relation Age of Onset  . Dementia Mother   . Hyperlipidemia Mother   . Hypertension Mother   . Coronary artery disease Father   . Hypertension Father   . Hyperlipidemia Father   . Cancer Sister 57    uterine cancer     Review of Systems: As noted in history of present illness.  Recent colonoscopy showed benign polyps. All other systems were reviewed and are negative.  Physical Exam: BP 136/77 mmHg  Pulse 65  Ht 5\' 4"  (1.626 m)  Wt 67.728 kg (149 lb 5 oz)  BMI 25.62 kg/m2 She is a pleasant, white female in no acute distress. HEENT: Normal Neck: No adenopathy, JVD, bruits, or thyromegaly. Lungs: Clear Cardiac: Regular rate and rhythm. No gallop or murmur. Normal S1-2.  Abdomen: Soft and nontender. No hepatosplenomegaly. Bowel sounds are positive. Extremities: No cyanosis or edema. Pulses are 2+. Skin: Warm and dry Neurological alert and oriented x3. Cranial nerves II through XII are intact.  LABORATORY DATA: Lab Results  Component Value Date   WBC 7.9 10/17/2014   HGB 11.2* 10/17/2014   HCT 33.1* 10/17/2014   PLT 342.0 10/17/2014   GLUCOSE 100* 11/09/2014   CHOL 238* 11/09/2014   TRIG 180* 11/09/2014   HDL 48 11/09/2014   LDLDIRECT 161.6 03/21/2014   LDLCALC 154* 11/09/2014   ALT 12 11/09/2014   AST 19 11/09/2014   NA 139 11/09/2014   K 4.5 11/09/2014   CL 104 11/09/2014   CREATININE 0.68 11/09/2014   BUN 16 11/09/2014   CO2 26 11/09/2014   TSH 0.14* 03/21/2014   INR 0.92 05/17/2014    Assessment / Plan: 1. Coronary disease status post stenting of the proximal LAD in September 2014 with drug-eluting stent. 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 Cardiac cath in November 2015 showed continued stent patency.  Continue her other antianginal therapy.   2. Hyperlipidemia. History of intolerance to multiple statins, Zetia, fenofibrates, and Lopid. Work on dietary modifications. Not a candidate for bile acid sequestrants with high triglycerides. Seen in lipid clinic and paperwork for PCSK9 inhibitor completed.     3. Hypertension- fair control. Continue Rx.  4. Hypothyroidism.

## 2015-02-19 NOTE — Patient Instructions (Signed)
Continue your current therapy  I will see you in 6 months.   

## 2015-02-20 ENCOUNTER — Telehealth: Payer: Self-pay | Admitting: Cardiology

## 2015-02-20 NOTE — Telephone Encounter (Signed)
Received a call from [facility] with [facility].Repatha has been denied.She will be faxing a letter.

## 2015-02-22 ENCOUNTER — Ambulatory Visit: Payer: Medicare Other | Admitting: Cardiology

## 2015-02-28 ENCOUNTER — Telehealth: Payer: Self-pay

## 2015-02-28 NOTE — Telephone Encounter (Signed)
Received a call from [place] with [facility]. Praluent injection has been approved.

## 2015-03-05 ENCOUNTER — Telehealth: Payer: Self-pay | Admitting: Pharmacist Clinician (PhC)/ Clinical Pharmacy Specialist

## 2015-03-05 NOTE — Telephone Encounter (Signed)
Pt approved for Praluent use, copay to be $220/month.  Gave patient phone number for [employer].  She will call when she gets information.

## 2015-04-24 ENCOUNTER — Encounter: Payer: Self-pay | Admitting: Internal Medicine

## 2015-04-24 ENCOUNTER — Ambulatory Visit (INDEPENDENT_AMBULATORY_CARE_PROVIDER_SITE_OTHER): Payer: Medicare Other | Admitting: Internal Medicine

## 2015-04-24 VITALS — BP 124/76 | HR 63 | Temp 97.7°F | Ht 64.0 in | Wt 149.0 lb

## 2015-04-24 DIAGNOSIS — Z0000 Encounter for general adult medical examination without abnormal findings: Secondary | ICD-10-CM | POA: Diagnosis not present

## 2015-04-24 DIAGNOSIS — E034 Atrophy of thyroid (acquired): Secondary | ICD-10-CM | POA: Diagnosis not present

## 2015-04-24 DIAGNOSIS — Z1159 Encounter for screening for other viral diseases: Secondary | ICD-10-CM | POA: Diagnosis not present

## 2015-04-24 DIAGNOSIS — E559 Vitamin D deficiency, unspecified: Secondary | ICD-10-CM | POA: Diagnosis not present

## 2015-04-24 DIAGNOSIS — Z23 Encounter for immunization: Secondary | ICD-10-CM | POA: Diagnosis not present

## 2015-04-24 DIAGNOSIS — Z79899 Other long term (current) drug therapy: Secondary | ICD-10-CM | POA: Diagnosis not present

## 2015-04-24 DIAGNOSIS — E785 Hyperlipidemia, unspecified: Secondary | ICD-10-CM

## 2015-04-24 DIAGNOSIS — E038 Other specified hypothyroidism: Secondary | ICD-10-CM

## 2015-04-24 MED ORDER — CLORAZEPATE DIPOTASSIUM 7.5 MG PO TABS: 7.5000 mg | ORAL_TABLET | Freq: Every evening | ORAL | Status: DC | PRN

## 2015-04-24 NOTE — Patient Instructions (Signed)

## 2015-04-24 NOTE — Progress Notes (Signed)
Pre visit review using our clinic review tool, if applicable. No additional management support is needed unless otherwise documented below in the visit note. 

## 2015-04-24 NOTE — Progress Notes (Signed)
Patient ID: Veronica Barron, female    DOB: June 16, 1941  Age: 74 y.o. MRN: [id number]  The patient is here for annual Medicare wellness examination and management of other chronic and acute problems.   colonoscopy April 2016 for rectal bleeding while on Plavix.  ,  2 hyperplastic polyps removed,  And Severe diverticulosis noted (chart not updated with results).   The risk factors are reflected in the social history.  The roster of all physicians providing medical care to patient - is listed in the Snapshot section of the chart.  Activities of daily living:  The patient is 100% independent in all ADLs: dressing, toileting, feeding as well as independent mobility  Home safety : The patient has smoke detectors in the home. They wear seatbelts.  There are no firearms at home. There is no violence in the home.   There is no risks for hepatitis, STDs or HIV. There is no   history of blood transfusion. They have no travel history to infectious disease endemic areas of the world.  The patient has seen their dentist in the last six month. They have seen their eye doctor in the last year. They admit to slight hearing difficulty with regard to whispered voices and some television programs.  They have deferred audiologic testing in the last year.  They do not  have excessive sun exposure. Discussed the need for sun protection: hats, long sleeves and use of sunscreen if there is significant sun exposure.   Diet: the importance of a healthy diet is discussed. They do have a healthy diet.  The benefits of regular aerobic exercise were discussed. She walks 4 times per week ,  20 minutes.   Depression screen: there are no signs or vegative symptoms of depression- irritability, change in appetite, anhedonia, sadness/tearfullness.  Cognitive assessment: the patient manages all their financial and personal affairs and is actively engaged. They could relate day,date,year and events; recalled 2/3 objects at 3 minutes;  performed clock-face test normally.  The following portions of the patient's history were reviewed and updated as appropriate: allergies, current medications, past family history, past medical history,  past surgical history, past social history  and problem list.  Visual acuity was not assessed per patient preference since she has regular follow up with her ophthalmologist. Hearing and body mass index were assessed and reviewed.   During the course of the visit the patient was educated and counseled about appropriate screening and preventive services including : fall prevention , diabetes screening, nutrition counseling, colorectal cancer screening, and recommended immunizations.    CC: The primary encounter diagnosis was Encounter for immunization. Diagnoses of Hypothyroidism due to acquired atrophy of thyroid, Hyperlipidemia, Vitamin D deficiency, Need for hepatitis C screening test, Long-term use of high-risk medication, and Encounter for Medicare annual wellness exam were also pertinent to this visit.  She had a cardiology eval in august,  Praluent was recommended but deferred due to cost.  She was prescribed fenofibrate but stopped taking it due to constipation .  History Keileigh has a past medical history of Leiomyoma of uterus, unspecified; Unspecified hypothyroidism; Hypertension; Hyperlipidemia; Allergic rhinitis, cause unspecified; Diverticulosis of colon; CAD (coronary artery disease); and Ischemic cardiomyopathy.   She has past surgical history that includes Tubal ligation; Cholecystectomy; Abdominal hysterectomy; left heart catheterization with coronary angiogram (N/A, 03/16/2013); percutaneous coronary stent intervention (pci-s) (03/16/2013); and left heart catheterization with coronary angiogram (N/A, 05/23/2014).   Her family history includes Cancer (age of onset: 86) in her sister; Coronary  artery disease in her father; Dementia in her mother; Hyperlipidemia in her father and mother;  Hypertension in her father and mother.She reports that she has never smoked. She has never used smokeless tobacco. She reports that she drinks alcohol. She reports that she does not use illicit drugs.  Outpatient Prescriptions Prior to Visit  Medication Sig Dispense Refill  . ALFALFA PO Take 1 tablet by mouth daily.    Marland Kitchen aspirin EC 81 MG tablet Take 81 mg by mouth daily.    . calcium carbonate (OS-CAL) 600 MG TABS Take 600 mg by mouth daily.    . carvedilol (COREG) 6.25 MG tablet Take 1 tablet (6.25 mg total) by mouth 2 (two) times daily with a meal. 180 tablet 3  . clopidogrel (PLAVIX) 75 MG tablet Take 1 tablet (75 mg total) by mouth daily. 90 tablet 3  . hydrocortisone (PROCTOZONE-HC) 2.5 % rectal cream Place 1 application rectally at bedtime. For 7 days 30 g 0  . levothyroxine (SYNTHROID, LEVOTHROID) 88 MCG tablet Take 1 tablet (88 mcg total) by mouth daily before breakfast. 90 tablet 2  . lisinopril (PRINIVIL,ZESTRIL) 5 MG tablet Take 1 tablet (5 mg total) by mouth 2 (two) times daily. 180 tablet 3  . multivitamin-lutein (OCUVITE-LUTEIN) CAPS capsule Take 1 capsule by mouth daily.    . nitroGLYCERIN (NITROSTAT) 0.4 MG SL tablet Place 1 tablet (0.4 mg total) under the tongue every 5 (five) minutes as needed for chest pain. 50 tablet 3  . omega-3 acid ethyl esters (LOVAZA) 1 G capsule Take 2 capsules (2 g total) by mouth 2 (two) times daily. 120 capsule 5  . Omega-3 Fatty Acids (FISH OIL) 1000 MG CPDR Take 1 capsule by mouth daily.    . sertraline (ZOLOFT) 100 MG tablet Take 0.5 tablets (50 mg total) by mouth daily. 90 tablet 1  . vitamin B-12 (CYANOCOBALAMIN) 500 MCG tablet Take 500 mcg by mouth daily.    . vitamin C (ASCORBIC ACID) 500 MG tablet Take 500 mg by mouth daily.     No facility-administered medications prior to visit.    Review of Systems   Patient denies headache, fevers, malaise, unintentional weight loss, skin rash, eye pain, sinus congestion and sinus pain, sore throat,  dysphagia,  hemoptysis , cough, dyspnea, wheezing, chest pain, palpitations, orthopnea, edema, abdominal pain, nausea, melena, diarrhea, constipation, flank pain, dysuria, hematuria, urinary  Frequency, nocturia, numbness, tingling, seizures,  Focal weakness, Loss of consciousness,  Tremor, insomnia, depression, anxiety, and suicidal ideation.      Objective:  BP 124/76 mmHg  Pulse 63  Temp(Src) 97.7 F (36.5 C) (Oral)  Ht 5\' 4"  (1.626 m)  Wt 149 lb (67.586 kg)  BMI 25.56 kg/m2  SpO2 96%  Physical Exam   General appearance: alert, cooperative and appears stated age Head: Normocephalic, without obvious abnormality, atraumatic Eyes: conjunctivae/corneas clear. PERRL, EOM's intact. Fundi benign. Ears: normal TM's and external ear canals both ears Nose: Nares normal. Septum midline. Mucosa normal. No drainage or sinus tenderness. Throat: lips, mucosa, and tongue normal; teeth and gums normal Neck: no adenopathy, no carotid bruit, no JVD, supple, symmetrical, trachea midline and thyroid not enlarged, symmetric, no tenderness/mass/nodules Lungs: clear to auscultation bilaterally Breasts: normal appearance, no masses or tenderness Heart: regular rate and rhythm, S1, S2 normal, no murmur, click, rub or gallop Abdomen: soft, non-tender; bowel sounds normal; no masses,  no organomegaly Extremities: extremities normal, atraumatic, no cyanosis or edema Pulses: 2+ and symmetric Skin: Skin color, texture, turgor normal. No  rashes or lesions Neurologic: Alert and oriented X 3, normal strength and tone. Normal symmetric reflexes. Normal coordination and gait.     Assessment & Plan:   Problem List Items Addressed This Visit    Encounter for Medicare annual wellness exam    Annual Medicare wellness  exam was done as well as a comprehensive physical exam and management of acute and chronic conditions .  During the course of the visit the patient was educated and counseled about appropriate  screening and preventive services including : fall prevention , diabetes screening, nutrition counseling, colorectal cancer screening, and recommended immunizations.  Printed recommendations for health maintenance screenings was given.       Hyperlipidemia    Untreated due to statin intolerance and now, fenofibrate intolerance due to constipation.  Advised patient to reconsider trial of fenofibrate to lower her cholesterol given history of CAD,  As there are multiple ways to treat constipation .  She prefers to discuss this with her cardiologist.       Relevant Orders   Lipid panel (Completed)    Other Visit Diagnoses    Encounter for immunization    -  Primary    Hypothyroidism due to acquired atrophy of thyroid        Relevant Orders    TSH (Completed)    Vitamin D deficiency        Relevant Orders    Vit D  25 hydroxy (rtn osteoporosis monitoring) (Completed)    Need for hepatitis C screening test        Relevant Orders    Hepatitis C antibody (Completed)    Long-term use of high-risk medication        Relevant Orders    CBC with Differential/Platelet (Completed)    Comprehensive metabolic panel (Completed)       I am having Ms. Spielberg maintain her vitamin C, calcium carbonate, nitroGLYCERIN, aspirin EC, multivitamin-lutein, ALFALFA PO, carvedilol, lisinopril, vitamin B-12, levothyroxine, hydrocortisone, omega-3 acid ethyl esters, sertraline, Fish Oil, clopidogrel, and clorazepate.  Meds ordered this encounter  Medications  . clorazepate (TRANXENE-T) 7.5 MG tablet    Sig: Take 1 tablet (7.5 mg total) by mouth at bedtime as needed for anxiety.    Dispense:  30 tablet    Refill:  3    There are no discontinued medications.  Follow-up: No Follow-up on file.   [name], MD

## 2015-04-25 LAB — CBC WITH DIFFERENTIAL/PLATELET
BASOS ABS: 0.1 10*3/uL (ref 0.0–0.1)
Basophils Relative: 1.7 % (ref 0.0–3.0)
EOS ABS: 0.2 10*3/uL (ref 0.0–0.7)
Eosinophils Relative: 2.1 % (ref 0.0–5.0)
HEMATOCRIT: 41.1 % (ref 36.0–46.0)
HEMOGLOBIN: 13.5 g/dL (ref 12.0–15.0)
LYMPHS PCT: 35.5 % (ref 12.0–46.0)
Lymphs Abs: 2.8 10*3/uL (ref 0.7–4.0)
MCHC: 32.8 g/dL (ref 30.0–36.0)
MCV: 77.7 fl — AB (ref 78.0–100.0)
MONOS PCT: 4.9 % (ref 3.0–12.0)
Monocytes Absolute: 0.4 10*3/uL (ref 0.1–1.0)
NEUTROS ABS: 4.3 10*3/uL (ref 1.4–7.7)
Neutrophils Relative %: 55.8 % (ref 43.0–77.0)
PLATELETS: 310 10*3/uL (ref 150.0–400.0)
RBC: 5.29 Mil/uL — ABNORMAL HIGH (ref 3.87–5.11)
RDW: 19.1 % — ABNORMAL HIGH (ref 11.5–15.5)
WBC: 7.8 10*3/uL (ref 4.0–10.5)

## 2015-04-25 LAB — LIPID PANEL
CHOL/HDL RATIO: 5
CHOLESTEROL: 258 mg/dL — AB (ref 0–200)
HDL: 48.5 mg/dL (ref >39.00)

## 2015-04-25 LAB — COMPREHENSIVE METABOLIC PANEL
ALK PHOS: 53 U/L (ref 39–117)
ALT: 13 U/L (ref 0–35)
AST: 19 U/L (ref 0–37)
Albumin: 4.2 g/dL (ref 3.5–5.2)
BUN: 14 mg/dL (ref 6–23)
CALCIUM: 9.6 mg/dL (ref 8.4–10.5)
CO2: 27 meq/L (ref 19–32)
Chloride: 102 mEq/L (ref 96–112)
Creatinine, Ser: 0.75 mg/dL (ref 0.40–1.20)
GFR: 80.1 mL/min (ref >60.00)
GLUCOSE: 69 mg/dL — AB (ref 70–99)
POTASSIUM: 4.5 meq/L (ref 3.5–5.1)
Sodium: 138 mEq/L (ref 135–145)
TOTAL PROTEIN: 6.9 g/dL (ref 6.0–8.3)
Total Bilirubin: 0.4 mg/dL (ref 0.2–1.2)

## 2015-04-25 LAB — HEPATITIS C ANTIBODY: HCV Ab: NEGATIVE

## 2015-04-25 LAB — LDL CHOLESTEROL, DIRECT: LDL DIRECT: 159 mg/dL

## 2015-04-25 LAB — TSH: TSH: 0.21 u[IU]/mL — ABNORMAL LOW (ref 0.35–4.50)

## 2015-04-25 LAB — VITAMIN D 25 HYDROXY (VIT D DEFICIENCY, FRACTURES): VITD: 25.79 ng/mL — ABNORMAL LOW (ref 30.00–100.00)

## 2015-04-25 NOTE — Assessment & Plan Note (Signed)

## 2015-04-25 NOTE — Assessment & Plan Note (Signed)
Untreated due to statin intolerance and now, fenofibrate intolerance due to constipation.  Advised patient to reconsider trial of fenofibrate to lower her cholesterol given history of CAD,  As there are multiple ways to treat constipation .  She prefers to discuss this with her cardiologist.

## 2015-04-29 MED ORDER — LEVOTHYROXINE SODIUM 75 MCG PO TABS: 75.0000 ug | ORAL_TABLET | Freq: Every day | ORAL | Status: DC

## 2015-04-29 NOTE — Addendum Note (Signed)
Addended by: [name] on: 04/29/2015 09:42 PM   Modules accepted: Orders, SmartSet

## 2015-04-29 NOTE — Assessment & Plan Note (Signed)
Thyroid function is overactive on current dose.  I have sent a lower dose of levothyroxine to her pharmacy and would like patient to change immediatley.  

## 2015-05-02 ENCOUNTER — Other Ambulatory Visit: Payer: Self-pay | Admitting: Cardiology

## 2015-05-02 ENCOUNTER — Other Ambulatory Visit: Payer: Self-pay | Admitting: *Deleted

## 2015-05-02 MED ORDER — LEVOTHYROXINE SODIUM 75 MCG PO TABS: 75.0000 ug | ORAL_TABLET | Freq: Every day | ORAL | Status: DC

## 2015-06-28 ENCOUNTER — Other Ambulatory Visit: Payer: Self-pay | Admitting: Cardiology

## 2015-06-29 NOTE — Telephone Encounter (Signed)
Rx(s) sent to pharmacy electronically.  

## 2015-09-03 ENCOUNTER — Other Ambulatory Visit: Payer: Self-pay

## 2015-09-03 MED ORDER — LEVOTHYROXINE SODIUM 75 MCG PO TABS: 75.0000 ug | ORAL_TABLET | Freq: Every day | ORAL | Status: DC

## 2015-09-24 ENCOUNTER — Other Ambulatory Visit: Payer: Self-pay

## 2015-09-24 DIAGNOSIS — Z1231 Encounter for screening mammogram for malignant neoplasm of breast: Secondary | ICD-10-CM

## 2015-10-04 ENCOUNTER — Ambulatory Visit
Admission: RE | Admit: 2015-10-04 | Discharge: 2015-10-04 | Disposition: A | Discharge status: Home / Self Care | Payer: Medicare Other | Source: Ambulatory Visit

## 2015-10-04 DIAGNOSIS — Z1231 Encounter for screening mammogram for malignant neoplasm of breast: Secondary | ICD-10-CM | POA: Diagnosis not present

## 2015-10-10 ENCOUNTER — Telehealth: Payer: Self-pay | Admitting: Internal Medicine

## 2015-10-10 MED ORDER — SERTRALINE HCL 100 MG PO TABS: 50.0000 mg | ORAL_TABLET | Freq: Every day | ORAL | Status: DC

## 2015-10-10 NOTE — Telephone Encounter (Signed)
filled

## 2015-10-10 NOTE — Telephone Encounter (Signed)
Pt called about a refill the pharmacy stated they sent for sertraline (ZOLOFT) 100 MG tablet. Pharmacy is [address] AT [place]. Call pt @ [phone]. Thank you!

## 2015-10-19 ENCOUNTER — Encounter: Payer: Self-pay | Admitting: *Deleted

## 2015-10-23 ENCOUNTER — Encounter: Payer: Self-pay | Admitting: Cardiology

## 2015-10-23 ENCOUNTER — Other Ambulatory Visit: Payer: Self-pay

## 2015-10-23 ENCOUNTER — Ambulatory Visit (INDEPENDENT_AMBULATORY_CARE_PROVIDER_SITE_OTHER): Payer: Medicare Other | Admitting: Cardiology

## 2015-10-23 VITALS — BP 150/88 | HR 71 | Ht 64.0 in | Wt 153.0 lb

## 2015-10-23 DIAGNOSIS — E785 Hyperlipidemia, unspecified: Secondary | ICD-10-CM

## 2015-10-23 DIAGNOSIS — I10 Essential (primary) hypertension: Secondary | ICD-10-CM | POA: Diagnosis not present

## 2015-10-23 DIAGNOSIS — I2510 Atherosclerotic heart disease of native coronary artery without angina pectoris: Secondary | ICD-10-CM | POA: Diagnosis not present

## 2015-10-23 MED ORDER — LISINOPRIL 5 MG PO TABS: ORAL_TABLET | ORAL | Status: DC

## 2015-10-23 MED ORDER — CARVEDILOL 6.25 MG PO TABS: ORAL_TABLET | ORAL | Status: DC

## 2015-10-23 MED ORDER — CLOPIDOGREL BISULFATE 75 MG PO TABS: 75.0000 mg | ORAL_TABLET | Freq: Every day | ORAL | Status: DC

## 2015-10-23 NOTE — Progress Notes (Signed)
[name] Date of Birth: 12/17/1940 Medical Record [id number]  History of Present Illness: Veronica Barron is seen today for followup CAD. She has a history of hypertension, hyperlipidemia, and hypothyroidism. In Sept 2014 she had stenting of the proximal LAD with DES.  The distal left circumflex was occluded with left to left collaterals and right-to-left collaterals. The right coronary was without significant disease. Ejection fraction was 30%.  Her other disease was treated medically. On followup Echo in Dec. 2014 EF had improved to 50-55%. In November 2015 she had a significant episode of chest pain. Repeat cardiac cath showed occlusion of the distal LCx and the stent in the LAD was patent.  She has a history of intolerance to multiple statins, Zetia and Lopid, and fenofibrates. She did apply for a PCSK 9 inhibitor but could not afford it. She is thinking of taking "natural" remedy called Bergamot.  On follow up today she is feeling very well. No chest pain or SOB. Walks daily and does yard work.    Current Outpatient Prescriptions on File Prior to Visit  Medication Sig Dispense Refill  . ALFALFA PO Take 1 tablet by mouth daily.    Marland Kitchen aspirin EC 81 MG tablet Take 81 mg by mouth daily.    . calcium carbonate (OS-CAL) 600 MG TABS Take 600 mg by mouth daily.    . carvedilol (COREG) 6.25 MG tablet TAKE 1 BY MOUTH TWICE DAILY WITH A MEAL 180 tablet 2  . clopidogrel (PLAVIX) 75 MG tablet Take 1 tablet (75 mg total) by mouth daily. 90 tablet 3  . clorazepate (TRANXENE-T) 7.5 MG tablet Take 1 tablet (7.5 mg total) by mouth at bedtime as needed for anxiety. 30 tablet 3  . levothyroxine (SYNTHROID, LEVOTHROID) 75 MCG tablet Take 1 tablet (75 mcg total) by mouth daily before breakfast. 90 tablet 0  . lisinopril (PRINIVIL,ZESTRIL) 5 MG tablet TAKE 1 BY MOUTH TWICE DAILY 180 tablet 1  . multivitamin-lutein (OCUVITE-LUTEIN) CAPS capsule Take 1 capsule by mouth daily.    . Omega-3 Fatty Acids (FISH  OIL) 1000 MG CPDR Take 1 capsule by mouth daily.    . sertraline (ZOLOFT) 100 MG tablet Take 0.5 tablets (50 mg total) by mouth daily. 90 tablet 1  . vitamin B-12 (CYANOCOBALAMIN) 500 MCG tablet Take 500 mcg by mouth daily.    . vitamin C (ASCORBIC ACID) 500 MG tablet Take 500 mg by mouth daily.     No current facility-administered medications on file prior to visit.    Allergies  Allergen Reactions  . Diphenhydramine Hcl     REACTION: TACHYCARDIA  . Fenofibrate Other (See Comments)    constipation  . Statins     Myalgia   . Zetia [Ezetimibe] Other (See Comments)    Myalgias     Past Medical History  Diagnosis Date  . Leiomyoma of uterus, unspecified   . Unspecified hypothyroidism   . Hypertension   . Hyperlipidemia   . Allergic rhinitis, cause unspecified   . Diverticulosis of colon   . CAD (coronary artery disease)     a. 03/2013 Abnl MV, EF 29%;  b. 03/2013 Cath/PCI: >M nl, LAD 99p (2.5x28 Promus Prem DES), LCX 100 after large OM1, RCA dominant, 20p, EF 30%.  . Ischemic cardiomyopathy     a. 03/2013 EF 30% by LV gram.;  b. Echo (12/14): Mod LVH with severe basal septal hypertrophy w/o LVOT obstr, EF 50-55%, normal wall motion, Gr 1 DD, MAC, mild MR, mild LAE,  trivial eff.    Past Surgical History  Procedure Laterality Date  . Tubal ligation    . Cholecystectomy    . Abdominal hysterectomy      fibroid   at  age 12  . Left heart catheterization with coronary angiogram N/A 03/16/2013    Procedure: LEFT HEART CATHETERIZATION WITH CORONARY ANGIOGRAM;  Surgeon: [name] [name], MD;  Location: [facility] CATH LAB;  Service: Cardiovascular;  Laterality: N/A;  . Percutaneous coronary stent intervention (pci-s)  03/16/2013    Procedure: PERCUTANEOUS CORONARY STENT INTERVENTION (PCI-S);  Surgeon: [name] [name], MD;  Location[facility] C[facility] LAB;  Service: Cardiovascular;;  . Left heart catheterization with coronary angiogram N/A 05/23/2014    Procedure: LEFT HEART CATHETERIZATION WITH CORONARY  ANGIOGRAM;  Surgeon: [name] [name], MD;  Location: [facility] CATH LAB;  Service: Cardiovascular;  Laterality: N/A;    History  Smoking status  . Never Smoker   Smokeless tobacco  . Never Used    History  Alcohol Use  . Yes    Comment: wine    Family History  Problem Relation Age of Onset  . Dementia Mother   . Hyperlipidemia Mother   . Hypertension Mother   . Coronary artery disease Father   . Hypertension Father   . Hyperlipidemia Father   . Uterine cancer Sister 60    Review of Systems: As noted in history of present illness.  All other systems were reviewed and are negative.  Physical Exam: BP 150/88 mmHg  Pulse 71  Ht 5\' 4"  (1.626 m)  Wt 69.4 kg (153 lb)  BMI 26.25 kg/m2 She is a pleasant, white female in no acute distress. HEENT: Normal Neck: No adenopathy, JVD, bruits, or thyromegaly. Lungs: Clear Cardiac: Regular rate and rhythm. No gallop or murmur. Normal S1-2.  Abdomen: Soft and nontender. No hepatosplenomegaly. Bowel sounds are positive. Extremities: No cyanosis or edema. Pulses are 2+. Skin: Warm and dry Neurological alert and oriented x3. Cranial nerves II through XII are intact.  LABORATORY DATA: Lab Results  Component Value Date   WBC 7.8 04/24/2015   HGB 13.5 04/24/2015   HCT 41.1 04/24/2015   PLT 310.0 04/24/2015   GLUCOSE 69* 04/24/2015   CHOL 258* 04/24/2015   TRIG * 04/24/2015    404.0 Triglyceride is over 400; calculations on Lipids are invalid.   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 I have personally reviewed and interpreted this study.  Assessment / Plan: 1. Coronary disease status post stenting of the proximal LAD in September 2014  with drug-eluting stent. 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 Cardiac cath in November 2015 showed continued stent patency.  Continue her current  antianginal therapy.   2. Hyperlipidemia. History of intolerance to multiple statins, Zetia, fenofibrates, and Lopid. Work on dietary modifications. Not a candidate for bile acid sequestrants with high triglycerides. Seen in lipid clinic but cannot afford  PCSK9 inhibitor    3. Hypertension- fair control. Continue Rx.  4. Hypothyroidism.

## 2015-10-23 NOTE — Patient Instructions (Signed)
Continue your current therapy  I will see you in 6  Months   

## 2015-11-22 ENCOUNTER — Other Ambulatory Visit: Payer: Self-pay

## 2015-11-22 MED ORDER — LEVOTHYROXINE SODIUM 75 MCG PO TABS: 75.0000 ug | ORAL_TABLET | Freq: Every day | ORAL | Status: DC

## 2016-01-11 ENCOUNTER — Other Ambulatory Visit: Payer: Self-pay | Admitting: Family Medicine

## 2016-01-11 MED ORDER — CLORAZEPATE DIPOTASSIUM 7.5 MG PO TABS: 7.5000 mg | ORAL_TABLET | Freq: Every evening | ORAL | Status: DC | PRN

## 2016-01-14 ENCOUNTER — Telehealth: Payer: Self-pay | Admitting: Internal Medicine

## 2016-01-14 MED ORDER — CLORAZEPATE DIPOTASSIUM 7.5 MG PO TABS: 7.5000 mg | ORAL_TABLET | Freq: Every evening | ORAL | Status: DC | PRN

## 2016-01-14 NOTE — Telephone Encounter (Signed)
Pt lvm stating that Dr. Derrel Nip gave her a rx in October for Tranxene-T 7.5 mg that she never got filled. She wants to know if Dr. Derrel Nip can send it to [facility] for her to get it filled. Pt cb [phone]

## 2016-01-14 NOTE — Telephone Encounter (Signed)
Re faxed to [place] per her request. thanks

## 2016-02-18 ENCOUNTER — Telehealth: Payer: Self-pay

## 2016-02-18 DIAGNOSIS — I10 Essential (primary) hypertension: Secondary | ICD-10-CM

## 2016-02-18 DIAGNOSIS — Z79899 Other long term (current) drug therapy: Secondary | ICD-10-CM

## 2016-02-18 DIAGNOSIS — E034 Atrophy of thyroid (acquired): Secondary | ICD-10-CM

## 2016-02-18 NOTE — Telephone Encounter (Signed)
Refill request from [facility] mail order for Levothyroxine, LOV 04/24/15. Please review. Thank youAzalee Course, RMA

## 2016-02-19 NOTE — Telephone Encounter (Signed)
Patient advised, lab appointment made, advised patient medication will not be filled until she has her levels rechecked.-aa

## 2016-02-19 NOTE — Telephone Encounter (Signed)
Do not refill.  The information you provided me in the refill request didn't give me enough information to make a decision. In this case,  Her TSH was abnormal and a medication dose change  was made, and she was asked to return for repeat TSH after 6 weeks of medication.  She did not do that,  So I will not refill her levothyroxine until she comes in for the lab.  I have ordered fasting labs because she is overdue  due for everything to be rechecked.   Thank you!

## 2016-02-27 ENCOUNTER — Other Ambulatory Visit (INDEPENDENT_AMBULATORY_CARE_PROVIDER_SITE_OTHER): Payer: Medicare Other

## 2016-02-27 DIAGNOSIS — R7989 Other specified abnormal findings of blood chemistry: Secondary | ICD-10-CM | POA: Diagnosis not present

## 2016-02-27 DIAGNOSIS — I10 Essential (primary) hypertension: Secondary | ICD-10-CM

## 2016-02-27 DIAGNOSIS — E038 Other specified hypothyroidism: Secondary | ICD-10-CM | POA: Diagnosis not present

## 2016-02-27 DIAGNOSIS — E034 Atrophy of thyroid (acquired): Secondary | ICD-10-CM | POA: Diagnosis not present

## 2016-02-27 DIAGNOSIS — Z79899 Other long term (current) drug therapy: Secondary | ICD-10-CM

## 2016-02-27 LAB — CBC WITH DIFFERENTIAL/PLATELET
BASOS ABS: 0 10*3/uL (ref 0.0–0.1)
Basophils Relative: 0.8 % (ref 0.0–3.0)
EOS ABS: 0.2 10*3/uL (ref 0.0–0.7)
Eosinophils Relative: 2.9 % (ref 0.0–5.0)
HCT: 42.1 % (ref 36.0–46.0)
Hemoglobin: 14.1 g/dL (ref 12.0–15.0)
LYMPHS ABS: 2.2 10*3/uL (ref 0.7–4.0)
Lymphocytes Relative: 36.1 % (ref 12.0–46.0)
MCHC: 33.5 g/dL (ref 30.0–36.0)
MCV: 85.9 fl (ref 78.0–100.0)
MONO ABS: 0.4 10*3/uL (ref 0.1–1.0)
MONOS PCT: 6 % (ref 3.0–12.0)
NEUTROS PCT: 54.2 % (ref 43.0–77.0)
Neutro Abs: 3.3 10*3/uL (ref 1.4–7.7)
PLATELETS: 290 10*3/uL (ref 150.0–400.0)
RBC: 4.9 Mil/uL (ref 3.87–5.11)
RDW: 14.3 % (ref 11.5–15.5)
WBC: 6.1 10*3/uL (ref 4.0–10.5)

## 2016-02-27 LAB — COMPREHENSIVE METABOLIC PANEL
ALT: 16 U/L (ref 0–35)
AST: 20 U/L (ref 0–37)
Albumin: 4.3 g/dL (ref 3.5–5.2)
Alkaline Phosphatase: 45 U/L (ref 39–117)
BUN: 15 mg/dL (ref 6–23)
CALCIUM: 9.6 mg/dL (ref 8.4–10.5)
CHLORIDE: 102 meq/L (ref 96–112)
CO2: 31 meq/L (ref 19–32)
CREATININE: 0.87 mg/dL (ref 0.40–1.20)
GFR: 67.34 mL/min (ref >60.00)
Glucose, Bld: 120 mg/dL — ABNORMAL HIGH (ref 70–99)
POTASSIUM: 4.7 meq/L (ref 3.5–5.1)
Sodium: 139 mEq/L (ref 135–145)
Total Bilirubin: 0.5 mg/dL (ref 0.2–1.2)
Total Protein: 7 g/dL (ref 6.0–8.3)

## 2016-02-27 LAB — LIPID PANEL
CHOL/HDL RATIO: 5
Cholesterol: 262 mg/dL — ABNORMAL HIGH (ref 0–200)
HDL: 53.8 mg/dL (ref >39.00)
NONHDL: 207.96
TRIGLYCERIDES: 287 mg/dL — AB (ref 0.0–149.0)
VLDL: 57.4 mg/dL — AB (ref 0.0–40.0)

## 2016-02-27 LAB — TSH: TSH: 2.45 u[IU]/mL (ref 0.35–4.50)

## 2016-02-27 LAB — LDL CHOLESTEROL, DIRECT: LDL DIRECT: 160 mg/dL

## 2016-03-18 ENCOUNTER — Other Ambulatory Visit: Payer: Self-pay | Admitting: *Deleted

## 2016-03-18 MED ORDER — LEVOTHYROXINE SODIUM 75 MCG PO TABS: 75.0000 ug | ORAL_TABLET | Freq: Every day | ORAL | 1 refills | Status: DC

## 2016-04-25 ENCOUNTER — Encounter: Payer: Self-pay | Admitting: Internal Medicine

## 2016-04-25 ENCOUNTER — Ambulatory Visit (INDEPENDENT_AMBULATORY_CARE_PROVIDER_SITE_OTHER): Payer: Medicare Other | Admitting: Internal Medicine

## 2016-04-25 VITALS — BP 140/80 | HR 88 | Temp 97.7°F | Resp 20 | Wt 152.5 lb

## 2016-04-25 DIAGNOSIS — I25118 Atherosclerotic heart disease of native coronary artery with other forms of angina pectoris: Secondary | ICD-10-CM

## 2016-04-25 DIAGNOSIS — Z23 Encounter for immunization: Secondary | ICD-10-CM

## 2016-04-25 DIAGNOSIS — R7301 Impaired fasting glucose: Secondary | ICD-10-CM

## 2016-04-25 DIAGNOSIS — E034 Atrophy of thyroid (acquired): Secondary | ICD-10-CM

## 2016-04-25 DIAGNOSIS — R7303 Prediabetes: Secondary | ICD-10-CM | POA: Diagnosis not present

## 2016-04-25 DIAGNOSIS — I10 Essential (primary) hypertension: Secondary | ICD-10-CM

## 2016-04-25 LAB — HEMOGLOBIN A1C
HEMOGLOBIN A1C: 5.6 % (ref <5.7)
Mean Plasma Glucose: 114 mg/dL

## 2016-04-25 NOTE — Progress Notes (Signed)
Subjective:  Patient ID: Veronica Barron, female    DOB: Jun 15, 1941  Age: 74 y.o. MRN: [id number]  CC: The primary encounter diagnosis was Impaired fasting glucose. Diagnoses of Encounter for immunization, Prediabetes, Hypothyroidism due to acquired atrophy of thyroid, Essential hypertension, and Coronary artery disease of native artery of native heart with stable angina pectoris [facility]) were also pertinent to this visit.  HPI Veronica Barron presents for FOLLOW UP ON hypertension, hypothyroidism and hyperlipidemia with CAD.  She has a history of CAD with prior stent 2014 , occluded distal Cx and patent LAD were noted on diagnostic cardiac  cath done Nov 2015 because of exertional dyspnea and chest pain . She has documented statin intolerant .  She could not afford Repatha , but is willing to try it.  Bergamot made legs ache. .legs do not ache normally.  Stays active with yardwork, and is walking daily, no repeat symptoms.   Recent labs discussed,  IPG and risk of diabetes discussed.  Diet reviewed in detail.   Sleeping ok,  Bowels move daily. Some mixed urge/stress incontinence since her hysterectomy      Last seen oct 2016 at which time her TSH was suppressed.  Medication was changed and repeat tsh was normal  Sees Dr Katy Fitch once a year for eye exam. Saw cardiology in April      Outpatient Medications Prior to Visit  Medication Sig Dispense Refill  . ALFALFA PO Take 1 tablet by mouth daily.    Marland Kitchen aspirin EC 81 MG tablet Take 81 mg by mouth daily.    . calcium carbonate (OS-CAL) 600 MG TABS Take 600 mg by mouth daily.    . carvedilol (COREG) 6.25 MG tablet TAKE 1 BY MOUTH TWICE DAILY WITH A MEAL 180 tablet 3  . clopidogrel (PLAVIX) 75 MG tablet Take 1 tablet (75 mg total) by mouth daily. 90 tablet 3  . clorazepate (TRANXENE-T) 7.5 MG tablet Take 1 tablet (7.5 mg total) by mouth at bedtime as needed for anxiety. 30 tablet 0  . levothyroxine (SYNTHROID, LEVOTHROID) 75 MCG tablet Take 1 tablet  (75 mcg total) by mouth daily before breakfast. 90 tablet 1  . lisinopril (PRINIVIL,ZESTRIL) 5 MG tablet TAKE 1 BY MOUTH TWICE DAILY 180 tablet 3  . multivitamin-lutein (OCUVITE-LUTEIN) CAPS capsule Take 1 capsule by mouth daily.    . Omega-3 Fatty Acids (FISH OIL) 1000 MG CPDR Take 1 capsule by mouth daily.    . sertraline (ZOLOFT) 100 MG tablet Take 0.5 tablets (50 mg total) by mouth daily. 90 tablet 1  . vitamin B-12 (CYANOCOBALAMIN) 500 MCG tablet Take 500 mcg by mouth daily.    . vitamin C (ASCORBIC ACID) 500 MG tablet Take 500 mg by mouth daily.     No facility-administered medications prior to visit.     Review of Systems;  Patient denies headache, fevers, malaise, unintentional weight loss, skin rash, eye pain, sinus congestion and sinus pain, sore throat, dysphagia,  hemoptysis , cough, dyspnea, wheezing, chest pain, palpitations, orthopnea, edema, abdominal pain, nausea, melena, diarrhea, constipation, flank pain, dysuria, hematuria, urinary  Frequency, nocturia, numbness, tingling, seizures,  Focal weakness, Loss of consciousness,  Tremor, insomnia, depression, anxiety, and suicidal ideation.      Objective:  BP 140/80   Pulse 88   Temp 97.7 F (36.5 C) (Oral)   Resp 20   Wt 152 lb 8 oz (69.2 kg)   SpO2 96%   BMI 26.18 kg/m   BP Readings from Last  3 Encounters:  04/25/16 140/80  10/23/15 (!) 150/88  04/24/15 124/76    Wt Readings from Last 3 Encounters:  04/25/16 152 lb 8 oz (69.2 kg)  10/23/15 153 lb (69.4 kg)  04/24/15 149 lb (67.6 kg)    General appearance: alert, cooperative and appears stated age Ears: normal TM's and external ear canals both ears Throat: lips, mucosa, and tongue normal; teeth and gums normal Neck: no adenopathy, no carotid bruit, supple, symmetrical, trachea midline and thyroid not enlarged, symmetric, no tenderness/mass/nodules Back: symmetric, no curvature. ROM normal. No CVA tenderness. Lungs: clear to auscultation bilaterally Heart:  regular rate and rhythm, S1, S2 normal, no murmur, click, rub or gallop Abdomen: soft, non-tender; bowel sounds normal; no masses,  no organomegaly Pulses: 2+ and symmetric Skin: Skin color, texture, turgor normal. No rashes or lesions Lymph nodes: Cervical, supraclavicular, and axillary nodes normal.  Lab Results  Component Value Date   HGBA1C 5.6 04/25/2016    Lab Results  Component Value Date   CREATININE 0.87 02/27/2016   CREATININE 0.75 04/24/2015   CREATININE 0.68 11/09/2014    Lab Results  Component Value Date   WBC 6.1 02/27/2016   HGB 14.1 02/27/2016   HCT 42.1 02/27/2016   PLT 290.0 02/27/2016   GLUCOSE 120 (H) 02/27/2016   CHOL 262 (H) 02/27/2016   TRIG 287.0 (H) 02/27/2016   HDL 53.80 02/27/2016   LDLDIRECT 160.0 02/27/2016   LDLCALC 154 (H) 11/09/2014   ALT 16 02/27/2016   AST 20 02/27/2016   NA 139 02/27/2016   K 4.7 02/27/2016   CL 102 02/27/2016   CREATININE 0.87 02/27/2016   BUN 15 02/27/2016   CO2 31 02/27/2016   TSH 2.45 02/27/2016   INR 0.92 05/17/2014   HGBA1C 5.6 04/25/2016    Mm Digital Screening Bilateral  Result Date: 10/04/2015 CLINICAL DATA:  Screening. EXAM: DIGITAL SCREENING BILATERAL MAMMOGRAM WITH CAD COMPARISON:  Previous exam(s). ACR Breast Density Category b: There are scattered areas of fibroglandular density. FINDINGS: There are no findings suspicious for malignancy. Images were processed with CAD. IMPRESSION: No mammographic evidence of malignancy. A result letter of this screening mammogram will be mailed directly to the patient. RECOMMENDATION: Screening mammogram in one year. (Code:SM-B-01Y) BI-RADS CATEGORY  1: Negative. Electronically Signed   By: Lajean Manes M.D.   On: 10/04/2015 17:05    Assessment & Plan:   Problem List Items Addressed This Visit    Hypothyroidism    Thyroid function is WNL on current dose.  No current changes needed.   Lab Results  Component Value Date   TSH 2.45 02/27/2016         Essential  hypertension    Well controlled on current regimen. Renal function stable, no changes today.  Lab Results  Component Value Date   CREATININE 0.87 02/27/2016   Lab Results  Component Value Date   NA 139 02/27/2016   K 4.7 02/27/2016   CL 102 02/27/2016   CO2 31 02/27/2016         CAD (coronary artery disease)    With prior stent placement in 2014 , patent during 2015 diagnostic cath but occluded distal Cx.  She is willing to try Repatha if PA can be done fore recent direct LDL of 160  Lab Results  Component Value Date   CHOL 262 (H) 02/27/2016   HDL 53.80 02/27/2016   LDLCALC 154 (H) 11/09/2014   LDLDIRECT 160.0 02/27/2016   TRIG 287.0 (H) 02/27/2016   CHOLHDL 5  02/27/2016         RESOLVED: Prediabetes   Impaired fasting glucose - Primary    Her a1c is 5.6 ,  Fasting glucose was 120.  I have recommended a low glycemic index diet utilizing smaller more frequent meals to increase metabolism.  I have also recommended that patient start exercising with a goal of 30 minutes of aerobic exercise a minimum of 5 days per week.       Relevant Orders   Hemoglobin A1c (Completed)    Other Visit Diagnoses    Encounter for immunization       Relevant Orders   Flu Vaccine QUAD 36+ mos IM (Completed)     A total of 40 minutes was spent with patient more than half of which was spent in counseling patient on the above mentioned issues , reviewing and explaining recent labs and imaging studies done, and coordination of care.  I am having Ms. Belyeu maintain her vitamin C, calcium carbonate, aspirin EC, multivitamin-lutein, ALFALFA PO, vitamin B-12, Fish Oil, sertraline, lisinopril, clopidogrel, carvedilol, clorazepate, and levothyroxine.  No orders of the defined types were placed in this encounter.   There are no discontinued medications.  Follow-up: Return in about 6 months (around 10/24/2016) for pre diabetes follow up .   [name], MD

## 2016-04-25 NOTE — Patient Instructions (Addendum)
Preventing progression to diabetes includes getting regular exercise and avoiding starchy foods and refined sugar as much as possible:  The attached list will help you choose foods from the Low Glycemic index column     To make a low carb chip :  Take the Joseph's Lavash or Pita bread,  Or the [place] Low carb whole wheat tortilla   Place on metal cookie sheet  Brush with olive oil  Sprinkle garlic powder (NOT garlic salt), grated parmesan cheese, mediterranean seasoning , or all of them?  Bake at 275 for 30 minutes   We have substitutions for your potatoes!!  Try the mashed cauliflower and riced cauliflower dishes instead of rice and mashed potatoes by [employer]   Mashed turnips are also very low carb!   For desserts :  Try the Dannon Lt n Fit greek yogurt dessert flavors and top with reddi Whip .  8 carbs,  80 calories  Try Oikos Triple Zero [place] Yogurt in the salted caramel, and the coffee flavors  With Whipped Cream for dessert  breyer's low carb ice cream, available in bars (on a stick, better ) or scoopable ice cream  HERE ARE THE LOW CARB  BREAD CHOICES

## 2016-04-25 NOTE — Progress Notes (Signed)
Pre visit review using our clinic review tool, if applicable. No additional management support is needed unless otherwise documented below in the visit note. 

## 2016-04-27 DIAGNOSIS — E785 Hyperlipidemia, unspecified: Secondary | ICD-10-CM | POA: Insufficient documentation

## 2016-04-27 DIAGNOSIS — R7301 Impaired fasting glucose: Secondary | ICD-10-CM | POA: Insufficient documentation

## 2016-04-27 DIAGNOSIS — E1169 Type 2 diabetes mellitus with other specified complication: Secondary | ICD-10-CM | POA: Insufficient documentation

## 2016-04-27 DIAGNOSIS — R7303 Prediabetes: Secondary | ICD-10-CM | POA: Insufficient documentation

## 2016-04-27 MED ORDER — EVOLOCUMAB 140 MG/ML ~~LOC~~ SOAJ: 140.0000 mg | SUBCUTANEOUS | 11 refills | Status: DC

## 2016-04-27 NOTE — Assessment & Plan Note (Signed)
With prior stent placement in 2014 , patent during 2015 diagnostic cath but occluded distal Cx.  She is willing to try Repatha if PA can be done fore recent direct LDL of 160  Lab Results  Component Value Date   CHOL 262 (H) 02/27/2016   HDL 53.80 02/27/2016   LDLCALC 154 (H) 11/09/2014   LDLDIRECT 160.0 02/27/2016   TRIG 287.0 (H) 02/27/2016   CHOLHDL 5 02/27/2016

## 2016-04-27 NOTE — Assessment & Plan Note (Signed)
Her a1c is 5.6 ,  Fasting glucose was 120.  I have recommended a low glycemic index diet utilizing smaller more frequent meals to increase metabolism.  I have also recommended that patient start exercising with a goal of 30 minutes of aerobic exercise a minimum of 5 days per week.

## 2016-04-27 NOTE — Assessment & Plan Note (Signed)
Thyroid function is WNL on current dose.  No current changes needed.   Lab Results  Component Value Date   TSH 2.45 02/27/2016

## 2016-04-27 NOTE — Assessment & Plan Note (Signed)
Well controlled on current regimen. Renal function stable, no changes today.  Lab Results  Component Value Date   CREATININE 0.87 02/27/2016   Lab Results  Component Value Date   NA 139 02/27/2016   K 4.7 02/27/2016   CL 102 02/27/2016   CO2 31 02/27/2016

## 2016-04-28 ENCOUNTER — Encounter: Payer: Self-pay | Admitting: *Deleted

## 2016-05-09 ENCOUNTER — Telehealth: Payer: Self-pay | Admitting: Internal Medicine

## 2016-05-09 NOTE — Telephone Encounter (Signed)
Darryl from [facility] called and stated that their was a tear interception on this and it has been denied. They will be mailing a letter.

## 2016-05-09 NOTE — Telephone Encounter (Signed)
Veronica Barron from [facility] called and had a couple of questions regarding Evolocumab (REPATHA SURECLICK) [id number] MG/ML SOAJ. Prior Auth needs to be done. Will be faxed but you can call to do this. If not done by 10/30 will be denied by the afternoon. Thank you!  Call @ [phone]

## 2016-05-09 NOTE — Telephone Encounter (Signed)
FYI

## 2016-05-09 NOTE — Telephone Encounter (Signed)
Please advise 

## 2016-05-09 NOTE — Telephone Encounter (Signed)
I called pt and left a vm to schedule for AWV. Thank you!

## 2016-05-09 NOTE — Telephone Encounter (Signed)
Sent PA today, thanks

## 2016-05-09 NOTE — Progress Notes (Signed)
Ok. Pt was called and vm left to call office and schedule AWV.

## 2016-05-19 ENCOUNTER — Other Ambulatory Visit: Payer: Self-pay | Admitting: Internal Medicine

## 2016-05-19 ENCOUNTER — Ambulatory Visit (INDEPENDENT_AMBULATORY_CARE_PROVIDER_SITE_OTHER): Payer: Medicare Other

## 2016-05-19 VITALS — BP 122/80 | HR 60 | Temp 98.1°F | Resp 14 | Ht 63.5 in | Wt 152.0 lb

## 2016-05-19 DIAGNOSIS — Z0000 Encounter for general adult medical examination without abnormal findings: Secondary | ICD-10-CM

## 2016-05-19 MED ORDER — SERTRALINE HCL 50 MG PO TABS: 50.0000 mg | ORAL_TABLET | Freq: Every day | ORAL | 1 refills | Status: DC

## 2016-05-19 NOTE — Patient Instructions (Addendum)
  Veronica Barron , Thank you for taking time to come for your Medicare Wellness Visit. I appreciate your ongoing commitment to your health goals. Please review the following plan we discussed and let me know if I can assist you in the future.   Follow up with Dr. Derrel Nip as needed.  These are the goals we discussed: Goals    . Healthy Lifestyle          Stay hydrated and drink plenty of fluids/water. Stay active and continue exercise regimen of walking. Low carb foods as previously directed.  Lean meats, vegetables.        This is a list of the screening recommended for you and due dates:  Health Maintenance  Topic Date Due  . Pneumonia vaccines (2 of 2 - PPSV23) 03/16/2015  . Mammogram  10/03/2017  . Tetanus Vaccine  03/10/2023  . Flu Shot  Completed  . DEXA scan (bone density measurement)  Completed  . Shingles Vaccine  Completed

## 2016-05-19 NOTE — Progress Notes (Addendum)
Subjective:   Veronica Barron is a 75 y.o. female who presents for Medicare Annual (Subsequent) preventive examination.  Review of Systems:  No ROS.  Medicare Wellness Visit.  Cardiac Risk Factors include: advanced age (>5men, >28 women);hypertension     Objective:     Vitals: BP 122/80 (BP Location: Right Arm, Patient Position: Sitting, Cuff Size: Normal)   Pulse 60   Temp 98.1 F (36.7 C) (Oral)   Resp 14   Ht 5' 3.5" (1.613 m)   Wt 152 lb (68.9 kg)   SpO2 97%   BMI 26.50 kg/m   Body mass index is 26.5 kg/m.   Tobacco History  Smoking Status  . Never Smoker  Smokeless Tobacco  . Never Used     Counseling given: Not Answered   Past Medical History:  Diagnosis Date  . Allergic rhinitis, cause unspecified   . CAD (coronary artery disease)    a. 03/2013 Abnl MV, EF 29%;  b. 03/2013 Cath/PCI: >M nl, LAD 99p (2.5x28 Promus Prem DES), LCX 100 after large OM1, RCA dominant, 20p, EF 30%.  . Diverticulosis of colon   . Hyperlipidemia   . Hypertension   . Ischemic cardiomyopathy    a. 03/2013 EF 30% by LV gram.;  b. Echo (12/14): Mod LVH with severe basal septal hypertrophy w/o LVOT obstr, EF 50-55%, normal wall motion, Gr 1 DD, MAC, mild MR, mild LAE, trivial eff.  . Leiomyoma of uterus, unspecified   . Unspecified hypothyroidism    Past Surgical History:  Procedure Laterality Date  . ABDOMINAL HYSTERECTOMY     fibroid   at  age 56  . CHOLECYSTECTOMY    . LEFT HEART CATHETERIZATION WITH CORONARY ANGIOGRAM N/A 03/16/2013   Procedure: LEFT HEART CATHETERIZATION WITH CORONARY ANGIOGRAM;  Surgeon: [name] [name], MD;  Location: [facility] CATH LAB;  Service: Cardiovascular;  Laterality: N/A;  . LEFT HEART CATHETERIZATION WITH CORONARY ANGIOGRAM N/A 05/23/2014   Procedure: LEFT HEART CATHETERIZATION WITH CORONARY ANGIOGRAM;  Surgeon: [name] [name], MD;  Location: [facility] CATH LAB;  Service: Cardiovascular;  Laterality: N/A;  . PERCUTANEOUS CORONARY STENT INTERVENTION (PCI-S)  03/16/2013     Procedure: PERCUTANEOUS CORONARY STENT INTERVENTION (PCI-S);  Surgeon: [name] [name], MD;  Location: [facility] CATH LAB;  Service: Cardiovascular;;  . TUBAL LIGATION     Family History  Problem Relation Age of Onset  . Dementia Mother   . Hyperlipidemia Mother   . Hypertension Mother   . Coronary artery disease Father   . Hypertension Father   . Hyperlipidemia Father   . Uterine cancer Sister 72   History  Sexual Activity  . Sexual activity: Not Currently  . Birth control/ protection: Post-menopausal    Outpatient Encounter Prescriptions as of 05/19/2016  Medication Sig  . ALFALFA PO Take 1 tablet by mouth daily.  Marland Kitchen aspirin EC 81 MG tablet Take 81 mg by mouth daily.  . calcium carbonate (OS-CAL) 600 MG TABS Take 600 mg by mouth daily.  . carvedilol (COREG) 6.25 MG tablet TAKE 1 BY MOUTH TWICE DAILY WITH A MEAL  . clopidogrel (PLAVIX) 75 MG tablet Take 1 tablet (75 mg total) by mouth daily.  . clorazepate (TRANXENE-T) 7.5 MG tablet Take 1 tablet (7.5 mg total) by mouth at bedtime as needed for anxiety.  . Evolocumab (REPATHA SURECLICK) [id number] MG/ML SOAJ Inject 140 mg into the skin every 14 (fourteen) days.  Marland Kitchen levothyroxine (SYNTHROID, LEVOTHROID) 75 MCG tablet Take 1 tablet (75 mcg total) by mouth daily  before breakfast.  . lisinopril (PRINIVIL,ZESTRIL) 5 MG tablet TAKE 1 BY MOUTH TWICE DAILY  . multivitamin-lutein (OCUVITE-LUTEIN) CAPS capsule Take 1 capsule by mouth daily.  . Omega-3 Fatty Acids (FISH OIL) 1000 MG CPDR Take 1 capsule by mouth daily.  . sertraline (ZOLOFT) 100 MG tablet Take 0.5 tablets (50 mg total) by mouth daily.  . vitamin B-12 (CYANOCOBALAMIN) 500 MCG tablet Take 500 mcg by mouth daily.  . vitamin C (ASCORBIC ACID) 500 MG tablet Take 500 mg by mouth daily.   No facility-administered encounter medications on file as of 05/19/2016.     Activities of Daily Living In your present state of health, do you have any difficulty performing the following activities:  05/19/2016  Hearing? N  Vision? N  Difficulty concentrating or making decisions? N  Walking or climbing stairs? N  Dressing or bathing? N  Doing errands, shopping? N  Preparing Food and eating ? N  Using the Toilet? N  In the past six months, have you accidently leaked urine? Y  Do you have problems with loss of bowel control? N  Managing your Medications? N  Managing your Finances? N  Housekeeping or managing your Housekeeping? N  Some recent data might be hidden    Patient Care Team: [name], MD as PCP - General (Internal Medicine) Judeth Horn, MD (General Surgery) Clent Jacks, MD (Ophthalmology)    Assessment:    This is a routine wellness examination for Veronica Barron. The goal of the wellness visit is to assist the patient how to close the gaps in care and create a preventative care plan for the patient.   Taking calcium VIT D as appropriate/Osteoporosis risk reviewed.  Medications reviewed; taking without issues or barriers.  Safety issues reviewed; smoke detectors in the home. Firearms locked in a safe within the home. Wears seatbelts when driving or riding with others. No violence in the home.  No identified risk were noted; The patient was oriented x 3; appropriate in dress and manner and no objective failures at ADL's or IADL's.   Body mass index; discussed the importance of a healthy diet, water intake and exercise. Educational material provided.  PNA 23 vaccine deferred per patient request.  Educational material provided.    Patient Concerns: R foot, second toe chronic pain; intermittent.  Wears UNA boot for relief.   Follow up appointment declined.  Encouraged to follow up with PCP if condition worsens and as needed.   Exercise Activities and Dietary recommendations Current Exercise Habits: Home exercise routine, Type of exercise: walking, Time (Minutes): 20, Frequency (Times/Week): 5, Weekly Exercise (Minutes/Week): 100, Intensity: Mild  Goals    .  Healthy Lifestyle          Stay hydrated and drink plenty of fluids/water. Stay active and continue exercise regimen of walking. Low carb foods as previously directed.  Lean meats, vegetables.       Fall Risk Fall Risk  05/19/2016 04/24/2015 03/15/2014  Falls in the past year? No No No   Depression Screen PHQ 2/9 Scores 05/19/2016 04/24/2015 03/15/2014  PHQ - 2 Score 0 0 1     Cognitive Function MMSE - Mini Mental State Exam 05/19/2016  Orientation to time 5  Orientation to Place 5  Registration 3  Attention/ Calculation 5  Recall 3  Language- name 2 objects 2  Language- repeat 1  Language- follow 3 step command 3  Language- read & follow direction 1  Write a sentence 1  Copy design 1  Total score 30        Immunization History  Administered Date(s) Administered  . Influenza Whole 07/14/2007, 05/15/2009, 06/13/2010  . Influenza,inj,Quad PF,36+ Mos 03/09/2013, 04/04/2014, 04/24/2015, 04/25/2016  . Influenza-Unspecified 05/19/2011  . Pneumococcal Conjugate-13 03/15/2014  . Tetanus 03/09/2013  . Zoster 11/01/2007   Screening Tests Health Maintenance  Topic Date Due  . PNA vac Low Risk Adult (2 of 2 - PPSV23) 03/16/2015  . MAMMOGRAM  10/03/2017  . TETANUS/TDAP  03/10/2023  . COLONOSCOPY  11/30/2024  . INFLUENZA VACCINE  Completed  . DEXA SCAN  Completed  . ZOSTAVAX  Completed      Plan:    End of life planning; Advance aging; Advanced directives discussed. Copy of current HCPOA/Living Will requested.  Medicare Attestation I have personally reviewed: The patient's medical and social history Their use of alcohol, tobacco or illicit drugs Their current medications and supplements The patient's functional ability including ADLs,fall risks, home safety risks, cognitive, and hearing and visual impairment Diet and physical activities Evidence for depression   The patient's weight, height, BMI, and visual acuity have been recorded in the chart.  I have made  referrals and provided education to the patient based on review of the above and I have provided the patient with a written personalized care plan for preventive services.    During the course of the visit the patient was educated and counseled about the following appropriate screening and preventive services:   Vaccines to include Pneumoccal, Influenza, Hepatitis B, Td, Zostavax, HCV  Electrocardiogram  Cardiovascular Disease  Colorectal cancer screening  Bone density screening  Diabetes screening  Glaucoma screening  Mammography/PAP  Nutrition counseling   Patient Instructions (the written plan) was given to the patient.   OBrien-Blaney, Denisa L, LPN  [id number]   I have reviewed the above information and agree with above.   Deborra Medina, MD

## 2016-05-20 DIAGNOSIS — H25812 Combined forms of age-related cataract, left eye: Secondary | ICD-10-CM | POA: Diagnosis not present

## 2016-05-20 DIAGNOSIS — H353131 Nonexudative age-related macular degeneration, bilateral, early dry stage: Secondary | ICD-10-CM | POA: Diagnosis not present

## 2016-05-20 DIAGNOSIS — H2511 Age-related nuclear cataract, right eye: Secondary | ICD-10-CM | POA: Diagnosis not present

## 2016-05-20 DIAGNOSIS — H35372 Puckering of macula, left eye: Secondary | ICD-10-CM | POA: Diagnosis not present

## 2016-06-18 NOTE — Progress Notes (Signed)
Veronica Barron Date of Birth: 1940/10/20 Medical Record [id number]  History of Present Illness: Veronica Barron is seen today for followup CAD. She has a history of hypertension, hyperlipidemia, and hypothyroidism. In Sept 2014 she had stenting of the proximal LAD with DES.  The distal left circumflex was occluded with left to left collaterals and right-to-left collaterals. The right coronary was without significant disease. Ejection fraction was 30%.  Her other disease was treated medically. On followup Echo in Dec. 2014 EF had improved to 50-55%. In November 2015 she had a significant episode of chest pain. Repeat cardiac cath showed occlusion of the distal LCx and the stent in the LAD was patent.  She has a history of intolerance to multiple statins, Zetia and Lopid, and fenofibrates. She did apply for a PCSK 9 inhibitor but could not afford it.   On follow up today she is feeling very well. No chest pain or SOB. Walks daily and does yard work. She does have chronic burning in her feet.    Current Outpatient Prescriptions on File Prior to Visit  Medication Sig Dispense Refill  . ALFALFA PO Take 1 tablet by mouth daily.    Marland Kitchen aspirin EC 81 MG tablet Take 81 mg by mouth daily.    . calcium carbonate (OS-CAL) 600 MG TABS Take 600 mg by mouth daily.    . carvedilol (COREG) 6.25 MG tablet TAKE 1 BY MOUTH TWICE DAILY WITH A MEAL 180 tablet 3  . clopidogrel (PLAVIX) 75 MG tablet Take 1 tablet (75 mg total) by mouth daily. 90 tablet 3  . clorazepate (TRANXENE-T) 7.5 MG tablet Take 1 tablet (7.5 mg total) by mouth at bedtime as needed for anxiety. 30 tablet 0  . levothyroxine (SYNTHROID, LEVOTHROID) 75 MCG tablet Take 1 tablet (75 mcg total) by mouth daily before breakfast. 90 tablet 1  . lisinopril (PRINIVIL,ZESTRIL) 5 MG tablet TAKE 1 BY MOUTH TWICE DAILY 180 tablet 3  . multivitamin-lutein (OCUVITE-LUTEIN) CAPS capsule Take 1 capsule by mouth daily.    . Omega-3 Fatty Acids (FISH OIL) 1000 MG CPDR  Take 1 capsule by mouth daily.    . sertraline (ZOLOFT) 50 MG tablet Take 1 tablet (50 mg total) by mouth daily. 90 tablet 1  . vitamin B-12 (CYANOCOBALAMIN) 500 MCG tablet Take 500 mcg by mouth daily.    . vitamin C (ASCORBIC ACID) 500 MG tablet Take 500 mg by mouth daily.     No current facility-administered medications on file prior to visit.     Allergies  Allergen Reactions  . Diphenhydramine Hcl     REACTION: TACHYCARDIA  . Fenofibrate Other (See Comments)    constipation  . Statins     Myalgia   . Zetia [Ezetimibe] Other (See Comments)    Myalgias     Past Medical History:  Diagnosis Date  . Allergic rhinitis, cause unspecified   . CAD (coronary artery disease)    a. 03/2013 Abnl MV, EF 29%;  b. 03/2013 Cath/PCI: >M nl, LAD 99p (2.5x28 Promus Prem DES), LCX 100 after large OM1, RCA dominant, 20p, EF 30%.  . Diverticulosis of colon   . Hyperlipidemia   . Hypertension   . Ischemic cardiomyopathy    a. 03/2013 EF 30% by LV gram.;  b. Echo (12/14): Mod LVH with severe basal septal hypertrophy w/o LVOT obstr, EF 50-55%, normal wall motion, Gr 1 DD, MAC, mild MR, mild LAE, trivial eff.  . Leiomyoma of uterus, unspecified   . Unspecified hypothyroidism  Past Surgical History:  Procedure Laterality Date  . ABDOMINAL HYSTERECTOMY     fibroid   at  age 23  . CHOLECYSTECTOMY    . LEFT HEART CATHETERIZATION WITH CORONARY ANGIOGRAM N/A 03/16/2013   Procedure: LEFT HEART CATHETERIZATION WITH CORONARY ANGIOGRAM;  Surgeon: Catherine Oak M [name], MD;  Location: [facility] CATH LAB;  Service: Cardiovascular;  Laterality: N/A;  . LEFT HEART CATHETERIZATION WITH CORONARY ANGIOGRAM N/A 05/23/2014   Procedure: LEFT HEART CATHETERIZATION WITH CORONARY ANGIOGRAM;  Surgeon: Jessica Checketts M [name], MD;  Location: [facility] CATH LAB;  Service: Cardiovascular;  Laterality: N/A;  . PERCUTANEOUS CORONARY STENT INTERVENTION (PCI-S)  03/16/2013   Procedure: PERCUTANEOUS CORONARY STENT INTERVENTION (PCI-S);  Surgeon: Clair Bardwell M  [name], MD;  Location: [facility] C[facility] LAB;  Service: Cardiovascular;;  . TUBAL LIGATION      History  Smoking Status  . Never Smoker  Smokeless Tobacco  . Never Used    History  Alcohol Use  . Yes    Comment: wine    Family History  Problem Relation Age of Onset  . Dementia Mother   . Hyperlipidemia Mother   . Hypertension Mother   . Coronary artery disease Father   . Hypertension Father   . Hyperlipidemia Father   . Uterine cancer Sister 30    Review of Systems: As noted in history of present illness.  All other systems were reviewed and are negative.  Physical Exam: BP 127/72   Pulse 65   Ht 5\' 3"  (1.6 m)   Wt 150 lb 12.8 oz (68.4 kg)   BMI 26.71 kg/m  She is a pleasant, white female in no acute distress. HEENT: Normal Neck: No adenopathy, JVD, bruits, or thyromegaly. Lungs: Clear Cardiac: Regular rate and rhythm. No gallop or murmur. Normal S1-2.  Abdomen: Soft and nontender. No hepatosplenomegaly. Bowel sounds are positive. Extremities: No cyanosis or edema. Pulses are 2+. Skin: Warm and dry Neurological alert and oriented x3. Cranial nerves II through XII are intact.  LABORATORY DATA: Lab Results  Component Value Date   WBC 6.1 02/27/2016   HGB 14.1 02/27/2016   HCT 42.1 02/27/2016   PLT 290.0 02/27/2016   GLUCOSE 120 (H) 02/27/2016   CHOL 262 (H) 02/27/2016   TRIG 287.0 (H) 02/27/2016   HDL 53.80 02/27/2016   LDLDIRECT 160.0 02/27/2016   LDLCALC 154 (H) 11/09/2014   ALT 16 02/27/2016   AST 20 02/27/2016   NA 139 02/27/2016   K 4.7 02/27/2016   CL 102 02/27/2016   CREATININE 0.87 02/27/2016   BUN 15 02/27/2016   CO2 31 02/27/2016   TSH 2.45 02/27/2016   INR 0.92 05/17/2014   HGBA1C 5.6 04/25/2016    Assessment / Plan: 1. Coronary disease status post stenting of the proximal LAD in September 2014 with drug-eluting stent. 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 Cardiac cath in November 2015 showed  continued stent patency.  Continue  antianginal therapy.   2. Hyperlipidemia. History of intolerance to multiple statins, Zetia, fenofibrates, and Lopid. Work on dietary modifications. Not a candidate for bile acid sequestrants with high triglycerides. Seen in lipid clinic but cannot afford  PCSK9 inhibitor.   3. Hypertension- good control. Continue Rx.  4. Hypothyroidism.

## 2016-06-20 ENCOUNTER — Encounter: Payer: Self-pay | Admitting: Cardiology

## 2016-06-20 ENCOUNTER — Ambulatory Visit (INDEPENDENT_AMBULATORY_CARE_PROVIDER_SITE_OTHER): Payer: Medicare Other | Admitting: Cardiology

## 2016-06-20 VITALS — BP 127/72 | HR 65 | Ht 63.0 in | Wt 150.8 lb

## 2016-06-20 DIAGNOSIS — E7800 Pure hypercholesterolemia, unspecified: Secondary | ICD-10-CM

## 2016-06-20 DIAGNOSIS — I10 Essential (primary) hypertension: Secondary | ICD-10-CM | POA: Diagnosis not present

## 2016-06-20 DIAGNOSIS — I25118 Atherosclerotic heart disease of native coronary artery with other forms of angina pectoris: Secondary | ICD-10-CM

## 2016-06-20 NOTE — Patient Instructions (Signed)
Continue your current therapy  I will see you in 6 months.   

## 2016-08-11 NOTE — Addendum Note (Signed)
Addended by: Dia Crawford on: 08/11/2016 03:23 PM   Modules accepted: Level of Service

## 2016-08-27 ENCOUNTER — Other Ambulatory Visit: Payer: Self-pay | Admitting: *Deleted

## 2016-08-27 MED ORDER — LEVOTHYROXINE SODIUM 75 MCG PO TABS: 75.0000 ug | ORAL_TABLET | Freq: Every day | ORAL | 1 refills | Status: DC

## 2016-09-11 ENCOUNTER — Other Ambulatory Visit: Payer: Self-pay | Admitting: *Deleted

## 2016-09-11 MED ORDER — SERTRALINE HCL 50 MG PO TABS: 50.0000 mg | ORAL_TABLET | Freq: Every day | ORAL | 0 refills | Status: DC

## 2016-11-17 ENCOUNTER — Other Ambulatory Visit: Payer: Self-pay

## 2016-11-17 MED ORDER — CLOPIDOGREL BISULFATE 75 MG PO TABS: 75.0000 mg | ORAL_TABLET | Freq: Every day | ORAL | 2 refills | Status: DC

## 2016-11-18 ENCOUNTER — Other Ambulatory Visit: Payer: Self-pay | Admitting: Internal Medicine

## 2016-11-18 DIAGNOSIS — Z1231 Encounter for screening mammogram for malignant neoplasm of breast: Secondary | ICD-10-CM

## 2016-12-10 ENCOUNTER — Ambulatory Visit
Admission: RE | Admit: 2016-12-10 | Discharge: 2016-12-10 | Disposition: A | Discharge status: Home / Self Care | Payer: Medicare Other | Source: Ambulatory Visit | Attending: Internal Medicine | Admitting: Internal Medicine

## 2016-12-10 DIAGNOSIS — Z1231 Encounter for screening mammogram for malignant neoplasm of breast: Secondary | ICD-10-CM | POA: Diagnosis not present

## 2016-12-18 ENCOUNTER — Other Ambulatory Visit: Payer: Self-pay

## 2016-12-18 MED ORDER — LISINOPRIL 5 MG PO TABS: ORAL_TABLET | ORAL | 3 refills | Status: DC

## 2016-12-31 NOTE — Progress Notes (Signed)
Veronica Barron Date of Birth: 19-Oct-1940 Medical Record #[id number]  History of Present Illness: Veronica Barron is seen today for followup CAD. She has a history of hypertension, hyperlipidemia, and hypothyroidism. In Sept 2014 she had stenting of the proximal LAD with DES.  The distal left circumflex was occluded with left to left collaterals and right-to-left collaterals. The right coronary was without significant disease. Ejection fraction was 30%.  Her other disease was treated medically. On followup Echo in Dec. 2014 EF had improved to 50-55%. In November 2015 she had a significant episode of chest pain. Repeat cardiac cath showed occlusion of the distal LCx and the stent in the LAD was patent.  She has a history of intolerance to multiple statins, Zetia and Lopid, and fenofibrates. She did apply for a PCSK 9 inhibitor but could not afford it.   On follow up today she is feeling very well. No chest pain or SOB. Walks daily and does yard work. She does note occasional lightheadedness when she first stands up. Reports BP at home in normal range.   Current Outpatient Prescriptions on File Prior to Visit  Medication Sig Dispense Refill  . ALFALFA PO Take 1 tablet by mouth daily.    Marland Kitchen aspirin EC 81 MG tablet Take 81 mg by mouth daily.    . calcium carbonate (OS-CAL) 600 MG TABS Take 600 mg by mouth daily.    . carvedilol (COREG) 6.25 MG tablet TAKE 1 BY MOUTH TWICE DAILY WITH A MEAL 180 tablet 3  . clopidogrel (PLAVIX) 75 MG tablet Take 1 tablet (75 mg total) by mouth daily. 90 tablet 2  . clorazepate (TRANXENE-T) 7.5 MG tablet Take 1 tablet (7.5 mg total) by mouth at bedtime as needed for anxiety. 30 tablet 0  . levothyroxine (SYNTHROID, LEVOTHROID) 75 MCG tablet Take 1 tablet (75 mcg total) by mouth daily before breakfast. 90 tablet 1  . lisinopril (PRINIVIL,ZESTRIL) 5 MG tablet TAKE 1 BY MOUTH TWICE DAILY 180 tablet 3  . multivitamin-lutein (OCUVITE-LUTEIN) CAPS capsule Take 1 capsule by mouth  daily.    . Omega-3 Fatty Acids (FISH OIL) 1000 MG CPDR Take 1 capsule by mouth daily.    . sertraline (ZOLOFT) 50 MG tablet Take 1 tablet (50 mg total) by mouth daily. 90 tablet 0  . vitamin B-12 (CYANOCOBALAMIN) 500 MCG tablet Take 500 mcg by mouth daily.    . vitamin C (ASCORBIC ACID) 500 MG tablet Take 500 mg by mouth daily.     No current facility-administered medications on file prior to visit.     Allergies  Allergen Reactions  . Diphenhydramine Hcl     REACTION: TACHYCARDIA  . Fenofibrate Other (See Comments)    constipation  . Statins     Myalgia   . Zetia [Ezetimibe] Other (See Comments)    Myalgias     Past Medical History:  Diagnosis Date  . Allergic rhinitis, cause unspecified   . CAD (coronary artery disease)    a. 03/2013 Abnl MV, EF 29%;  b. 03/2013 Cath/PCI: >M nl, LAD 99p (2.5x28 Promus Prem DES), LCX 100 after large OM1, RCA dominant, 20p, EF 30%.  . Diverticulosis of colon   . Hyperlipidemia   . Hypertension   . Ischemic cardiomyopathy    a. 03/2013 EF 30% by LV gram.;  b. Echo (12/14): Mod LVH with severe basal septal hypertrophy w/o LVOT obstr, EF 50-55%, normal wall motion, Gr 1 DD, MAC, mild MR, mild LAE, trivial eff.  . Leiomyoma  of uterus, unspecified   . Unspecified hypothyroidism     Past Surgical History:  Procedure Laterality Date  . ABDOMINAL HYSTERECTOMY     fibroid   at  age 83  . CHOLECYSTECTOMY    . LEFT HEART CATHETERIZATION WITH CORONARY ANGIOGRAM N/A 03/16/2013   Procedure: LEFT HEART CATHETERIZATION WITH CORONARY ANGIOGRAM;  Surgeon: Yeimi Debnam M [name], MD;  Location: [facility] CATH LAB;  Service: Cardiovascular;  Laterality: N/A;  . LEFT HEART CATHETERIZATION WITH CORONARY ANGIOGRAM N/A 05/23/2014   Procedure: LEFT HEART CATHETERIZATION WITH CORONARY ANGIOGRAM;  Surgeon: Neelie Welshans M [name], MD;  Location: [facility] CATH LAB;  Service: Cardiovascular;  Laterality: N/A;  . PERCUTANEOUS CORONARY STENT INTERVENTION (PCI-S)  03/16/2013   Procedure: PERCUTANEOUS  CORONARY STENT INTERVENTION (PCI-S);  Surgeon: Natale Thoma M [name], MD;  Location: [facility] CATH LAB;  Service: Cardiovascular;;  . TUBAL LIGATION      History  Smoking Status  . Never Smoker  Smokeless Tobacco  . Never Used    History  Alcohol Use  . Yes    Comment: wine    Family History  Problem Relation Age of Onset  . Dementia Mother   . Hyperlipidemia Mother   . Hypertension Mother   . Coronary artery disease Father   . Hypertension Father   . Hyperlipidemia Father   . Uterine cancer Sister 4  . Breast cancer Maternal Aunt     Review of Systems: As noted in history of present illness.  All other systems were reviewed and are negative.  Physical Exam: BP (!) 152/90   Pulse 70   Ht _0  (1.6 m)   Wt 152 lb (68.9 kg)   BMI 26.93 kg/m  She is a pleasant, white female in no acute distress. HEENT: Normal Neck: No adenopathy, JVD, bruits, or thyromegaly. Lungs: Clear Cardiac: Regular rate and rhythm. No gallop or murmur. Normal S1-2.  Abdomen: Soft and nontender. No hepatosplenomegaly. Bowel sounds are positive. Extremities: No cyanosis or edema. Pulses are 2+. Skin: Warm and dry Neurological alert and oriented x3. Cranial nerves II through XII are intact.  LABORATORY DATA: Lab Results  Component Value Date   WBC 6.1 02/27/2016   HGB 14.1 02/27/2016   HCT 42.1 02/27/2016   PLT 290.0 02/27/2016   GLUCOSE 120 (H) 02/27/2016   CHOL 262 (H) 02/27/2016   TRIG 287.0 (H) 02/27/2016   HDL 53.80 02/27/2016   LDLDIRECT 160.0 02/27/2016   LDLCALC 154 (H) 11/09/2014   ALT 16 02/27/2016   AST 20 02/27/2016   NA 139 02/27/2016   K 4.7 02/27/2016   CL 102 02/27/2016   CREATININE 0.87 02/27/2016   BUN 15 02/27/2016   CO2 31 02/27/2016   TSH 2.45 02/27/2016   INR 0.92 05/17/2014   HGBA1C 5.6 04/25/2016   Ecg today shows NSR rate 70. 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 No acute change. I have personally reviewed and interpreted this study.   Assessment / Plan: 1.  Coronary disease status post stenting of the proximal LAD in September 2014 with drug-eluting stent. 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 Cardiac cath in November 2015 showed continued stent patency.  Continue current therapy.   2. Hyperlipidemia. History of intolerance to multiple statins, Zetia, fenofibrates, and Lopid. Work on dietary modifications.  Seen in lipid clinic but cannot afford  PCSK9 inhibitor.   3. Hypertension- good control. Continue Rx.  4. Hypothyroidism.

## 2017-01-01 ENCOUNTER — Encounter: Payer: Self-pay | Admitting: Cardiology

## 2017-01-01 ENCOUNTER — Ambulatory Visit (INDEPENDENT_AMBULATORY_CARE_PROVIDER_SITE_OTHER): Payer: Medicare Other | Admitting: Cardiology

## 2017-01-01 VITALS — BP 152/90 | HR 70 | Ht 63.0 in | Wt 152.0 lb

## 2017-01-01 DIAGNOSIS — E7800 Pure hypercholesterolemia, unspecified: Secondary | ICD-10-CM

## 2017-01-01 DIAGNOSIS — I10 Essential (primary) hypertension: Secondary | ICD-10-CM | POA: Diagnosis not present

## 2017-01-01 DIAGNOSIS — I25118 Atherosclerotic heart disease of native coronary artery with other forms of angina pectoris: Secondary | ICD-10-CM | POA: Diagnosis not present

## 2017-01-01 NOTE — Patient Instructions (Addendum)
Continue your current therapy  I will see you in 6 months.   

## 2017-02-02 ENCOUNTER — Encounter: Payer: Self-pay | Admitting: Family

## 2017-02-02 ENCOUNTER — Ambulatory Visit (INDEPENDENT_AMBULATORY_CARE_PROVIDER_SITE_OTHER): Payer: Medicare Other | Admitting: Family

## 2017-02-02 VITALS — BP 132/70 | HR 75 | Temp 97.8°F | Ht 63.0 in | Wt 152.8 lb

## 2017-02-02 DIAGNOSIS — J40 Bronchitis, not specified as acute or chronic: Secondary | ICD-10-CM | POA: Diagnosis not present

## 2017-02-02 MED ORDER — CEFDINIR 300 MG PO CAPS: 300.0000 mg | ORAL_CAPSULE | Freq: Two times a day (BID) | ORAL | 0 refills | Status: DC

## 2017-02-02 MED ORDER — BENZONATATE 100 MG PO CAPS: 100.0000 mg | ORAL_CAPSULE | Freq: Two times a day (BID) | ORAL | 0 refills | Status: DC | PRN

## 2017-02-02 NOTE — Progress Notes (Signed)
Subjective:    Patient ID: Veronica Barron, female    DOB: 25-Jan-1941, 76 y.o.   MRN: [id number]  CC: Veronica Barron is a 76 y.o. female who presents today for an acute visit.    HPI: Chief complaint of productive cough, congestion  X one week, unchanged  No sore throat, sinus pain, ear pain.   OTC cloricidin with no relief.    H/o HTN, CAD- follows with Dr [name]. Denies exertional chest pain or pressure, numbness or tingling radiating to left arm or jaw, palpitations, dizziness, frequent headaches, changes in vision, or shortness of breath.    Nonsmoker   No ckd.    HISTORY:  Past Medical History:  Diagnosis Date  . Allergic rhinitis, cause unspecified   . CAD (coronary artery disease)    a. 03/2013 Abnl MV, EF 29%;  b. 03/2013 Cath/PCI: >M nl, LAD 99p (2.5x28 Promus Prem DES), LCX 100 after large OM1, RCA dominant, 20p, EF 30%.  . Diverticulosis of colon   . Hyperlipidemia   . Hypertension   . Ischemic cardiomyopathy    a. 03/2013 EF 30% by LV gram.;  b. Echo (12/14): Mod LVH with severe basal septal hypertrophy w/o LVOT obstr, EF 50-55%, normal wall motion, Gr 1 DD, MAC, mild MR, mild LAE, trivial eff.  . Leiomyoma of uterus, unspecified   . Unspecified hypothyroidism    Past Surgical History:  Procedure Laterality Date  . ABDOMINAL HYSTERECTOMY     fibroid   at  age 38  . CHOLECYSTECTOMY    . LEFT HEART CATHETERIZATION WITH CORONARY ANGIOGRAM N/A 03/16/2013   Procedure: LEFT HEART CATHETERIZATION WITH CORONARY ANGIOGRAM;  Surgeon: [name] [name], MD;  Location: [facility] CATH LAB;  Service: Cardiovascular;  Laterality: N/A;  . LEFT HEART CATHETERIZATION WITH CORONARY ANGIOGRAM N/A 05/23/2014   Procedure: LEFT HEART CATHETERIZATION WITH CORONARY ANGIOGRAM;  Surgeon: [name] [name], MD;  Location: [facility] CATH LAB;  Service: Cardiovascular;  Laterality: N/A;  . PERCUTANEOUS CORONARY STENT INTERVENTION (PCI-S)  03/16/2013   Procedure: PERCUTANEOUS CORONARY STENT INTERVENTION (PCI-S);   Surgeon: [name] [name], MD;  Location: [facility] CATH LAB;  Service: Cardiovascular;;  . TUBAL LIGATION     Family History  Problem Relation Age of Onset  . Dementia Mother   . Hyperlipidemia Mother   . Hypertension Mother   . Coronary artery disease Father   . Hypertension Father   . Hyperlipidemia Father   . Uterine cancer Sister 21  . Breast cancer Maternal Aunt     Allergies: Diphenhydramine hcl; Fenofibrate; Statins; and Zetia [ezetimibe] Current Outpatient Prescriptions on File Prior to Visit  Medication Sig Dispense Refill  . ALFALFA PO Take 1 tablet by mouth daily.    Marland Kitchen aspirin EC 81 MG tablet Take 81 mg by mouth daily.    . calcium carbonate (OS-CAL) 600 MG TABS Take 600 mg by mouth daily.    . carvedilol (COREG) 6.25 MG tablet TAKE 1 BY MOUTH TWICE DAILY WITH A MEAL 180 tablet 3  . clopidogrel (PLAVIX) 75 MG tablet Take 1 tablet (75 mg total) by mouth daily. 90 tablet 2  . clorazepate (TRANXENE-T) 7.5 MG tablet Take 1 tablet (7.5 mg total) by mouth at bedtime as needed for anxiety. 30 tablet 0  . levothyroxine (SYNTHROID, LEVOTHROID) 75 MCG tablet Take 1 tablet (75 mcg total) by mouth daily before breakfast. 90 tablet 1  . lisinopril (PRINIVIL,ZESTRIL) 5 MG tablet TAKE 1 BY MOUTH TWICE DAILY 180 tablet 3  .  multivitamin-lutein (OCUVITE-LUTEIN) CAPS capsule Take 1 capsule by mouth daily.    . Omega-3 Fatty Acids (FISH OIL) 1000 MG CPDR Take 1 capsule by mouth daily.    . sertraline (ZOLOFT) 50 MG tablet Take 1 tablet (50 mg total) by mouth daily. 90 tablet 0  . vitamin B-12 (CYANOCOBALAMIN) 500 MCG tablet Take 500 mcg by mouth daily.    . vitamin C (ASCORBIC ACID) 500 MG tablet Take 500 mg by mouth daily.     No current facility-administered medications on file prior to visit.     Social History  Substance Use Topics  . Smoking status: Never Smoker  . Smokeless tobacco: Never Used  . Alcohol use Yes     Comment: wine    Review of Systems  Constitutional: Negative for  chills and fever.  HENT: Positive for congestion. Negative for sinus pain and sore throat.   Respiratory: Positive for cough. Negative for shortness of breath and wheezing.   Cardiovascular: Negative for chest pain and palpitations.  Gastrointestinal: Negative for nausea and vomiting.      Objective:    BP 132/70   Pulse 75   Temp 97.8 F (36.6 C) (Oral)   Ht _0  (1.6 m)   Wt 152 lb 12.8 oz (69.3 kg)   SpO2 95%   BMI 27.07 kg/m    Physical Exam  Constitutional: She appears well-developed and well-nourished.  HENT:  Head: Normocephalic and atraumatic.  Right Ear: Hearing, tympanic membrane, external ear and ear canal normal. No drainage, swelling or tenderness. No foreign bodies. Tympanic membrane is not erythematous and not bulging. No middle ear effusion. No decreased hearing is noted.  Left Ear: Hearing, tympanic membrane, external ear and ear canal normal. No drainage, swelling or tenderness. No foreign bodies. Tympanic membrane is not erythematous and not bulging.  No middle ear effusion. No decreased hearing is noted.  Nose: Nose normal. No rhinorrhea. Right sinus exhibits no maxillary sinus tenderness and no frontal sinus tenderness. Left sinus exhibits no maxillary sinus tenderness and no frontal sinus tenderness.  Mouth/Throat: Uvula is midline, oropharynx is clear and moist and mucous membranes are normal. No oropharyngeal exudate, posterior oropharyngeal edema, posterior oropharyngeal erythema or tonsillar abscesses.  Eyes: Conjunctivae are normal.  Cardiovascular: Regular rhythm, normal heart sounds and normal pulses.   Pulmonary/Chest: Effort normal and breath sounds normal. She has no wheezes. She has no rhonchi. She has no rales.  Lymphadenopathy:       Head (right side): No submental, no submandibular, no tonsillar, no preauricular, no posterior auricular and no occipital adenopathy present.       Head (left side): No submental, no submandibular, no tonsillar, no  preauricular, no posterior auricular and no occipital adenopathy present.    She has no cervical adenopathy.  Neurological: She is alert.  Skin: Skin is warm and dry.  Psychiatric: She has a normal mood and affect. Her speech is normal and behavior is normal. Thought content normal.  Vitals reviewed.      Assessment & Plan:   1. Bronchitis Afebrile. No adventitious lung sounds on exam. Patient is in no acute respiratory distress Based on duration of symptoms and worsening thereof, patient and I jointly agreed to go ahead and start antibiotic. Probiotics encouraged. Return precautions given.  - benzonatate (TESSALON) 100 MG capsule; Take 1 capsule (100 mg total) by mouth 2 (two) times daily as needed for cough.  Dispense: 20 capsule; Refill: 0 - cefdinir (OMNICEF) 300 MG capsule; Take 1 capsule (  300 mg total) by mouth 2 (two) times daily.  Dispense: 20 capsule; Refill: 0    I am having Ms. Bady start on benzonatate and cefdinir. I am also having her maintain her vitamin C, calcium carbonate, aspirin EC, multivitamin-lutein, ALFALFA PO, vitamin B-12, Fish Oil, carvedilol, clorazepate, levothyroxine, sertraline, clopidogrel, and lisinopril.   Meds ordered this encounter  Medications  . benzonatate (TESSALON) 100 MG capsule    Sig: Take 1 capsule (100 mg total) by mouth 2 (two) times daily as needed for cough.    Dispense:  20 capsule    Refill:  0    Order Specific Question:   Supervising Provider    Answer:   Deborra Medina L [[id number]]  . cefdinir (OMNICEF) 300 MG capsule    Sig: Take 1 capsule (300 mg total) by mouth 2 (two) times daily.    Dispense:  20 capsule    Refill:  0    Order Specific Question:   Supervising Provider    Answer:   [name] [[id number]]    Return precautions given.   Risks, benefits, and alternatives of the medications and treatment plan prescribed today were discussed, and patient expressed understanding.   Education regarding symptom management and  diagnosis given to patient on AVS.  Continue to follow with [name], MD for routine health maintenance.   Veronica Barron and I agreed with plan.   Mable Paris, FNP

## 2017-02-02 NOTE — Patient Instructions (Signed)
Let's start antibiotic.   Ensure to take probiotics while on antibiotics and also for 2 weeks after completion. It is important to re-colonize the gut with good bacteria and also to prevent any diarrheal infections associated with antibiotic use.    Start mucinex ( plain ) as well  [place] of water.   Tessalon perles as needed  Let me know if not better.

## 2017-02-02 NOTE — Progress Notes (Signed)
Pre visit review using our clinic review tool, if applicable. No additional management support is needed unless otherwise documented below in the visit note. 

## 2017-02-03 ENCOUNTER — Other Ambulatory Visit: Payer: Self-pay

## 2017-02-03 MED ORDER — CARVEDILOL 6.25 MG PO TABS: ORAL_TABLET | ORAL | 3 refills | Status: DC

## 2017-02-24 ENCOUNTER — Other Ambulatory Visit: Payer: Self-pay | Admitting: *Deleted

## 2017-02-24 MED ORDER — LEVOTHYROXINE SODIUM 75 MCG PO TABS: 75.0000 ug | ORAL_TABLET | Freq: Every day | ORAL | 0 refills | Status: DC

## 2017-02-24 NOTE — Telephone Encounter (Signed)
Refilled: 08/27/2016 Last OV: 04/25/2016 Next OV: 04/29/2017 Last TSH: 02/27/2016

## 2017-02-24 NOTE — Telephone Encounter (Signed)
Pt has requested a medication refill for levothyroxine Pharmacy [facility] mail order

## 2017-02-24 NOTE — Telephone Encounter (Signed)
Please ask her to have fasting labs done PRIOR TO VISIT .  90 DAY REFILLED   TSH CMET FASTING LIPIDS  VIT D

## 2017-02-26 NOTE — Telephone Encounter (Signed)
Left message to call back  

## 2017-03-13 NOTE — Telephone Encounter (Signed)
Pt has been scheduled for a lab appt

## 2017-04-24 ENCOUNTER — Telehealth: Payer: Self-pay | Admitting: Radiology

## 2017-04-24 DIAGNOSIS — R7301 Impaired fasting glucose: Secondary | ICD-10-CM

## 2017-04-24 DIAGNOSIS — E034 Atrophy of thyroid (acquired): Secondary | ICD-10-CM

## 2017-04-24 DIAGNOSIS — E559 Vitamin D deficiency, unspecified: Secondary | ICD-10-CM

## 2017-04-24 DIAGNOSIS — K625 Hemorrhage of anus and rectum: Secondary | ICD-10-CM

## 2017-04-24 DIAGNOSIS — E7800 Pure hypercholesterolemia, unspecified: Secondary | ICD-10-CM

## 2017-04-24 NOTE — Telephone Encounter (Signed)
Pt coming in for labs on Monday, please place future orders. Thank you.  

## 2017-04-24 NOTE — Addendum Note (Signed)
Addended by: [name] on: 04/24/2017 12:53 PM   Modules accepted: Orders

## 2017-04-27 ENCOUNTER — Telehealth: Payer: Self-pay | Admitting: *Deleted

## 2017-04-27 ENCOUNTER — Other Ambulatory Visit (INDEPENDENT_AMBULATORY_CARE_PROVIDER_SITE_OTHER): Payer: Medicare Other

## 2017-04-27 DIAGNOSIS — E7800 Pure hypercholesterolemia, unspecified: Secondary | ICD-10-CM

## 2017-04-27 DIAGNOSIS — E559 Vitamin D deficiency, unspecified: Secondary | ICD-10-CM | POA: Diagnosis not present

## 2017-04-27 DIAGNOSIS — E034 Atrophy of thyroid (acquired): Secondary | ICD-10-CM | POA: Diagnosis not present

## 2017-04-27 DIAGNOSIS — R7301 Impaired fasting glucose: Secondary | ICD-10-CM

## 2017-04-27 DIAGNOSIS — K625 Hemorrhage of anus and rectum: Secondary | ICD-10-CM | POA: Diagnosis not present

## 2017-04-27 LAB — COMPREHENSIVE METABOLIC PANEL
ALT: 16 U/L (ref 0–35)
AST: 19 U/L (ref 0–37)
Albumin: 4.2 g/dL (ref 3.5–5.2)
Alkaline Phosphatase: 51 U/L (ref 39–117)
BUN: 14 mg/dL (ref 6–23)
CHLORIDE: 105 meq/L (ref 96–112)
CO2: 26 mEq/L (ref 19–32)
Calcium: 9.5 mg/dL (ref 8.4–10.5)
Creatinine, Ser: 0.83 mg/dL (ref 0.40–1.20)
GFR: 70.88 mL/min (ref >60.00)
GLUCOSE: 113 mg/dL — AB (ref 70–99)
POTASSIUM: 4.4 meq/L (ref 3.5–5.1)
SODIUM: 142 meq/L (ref 135–145)
Total Bilirubin: 0.6 mg/dL (ref 0.2–1.2)
Total Protein: 7 g/dL (ref 6.0–8.3)

## 2017-04-27 LAB — LIPID PANEL
CHOL/HDL RATIO: 5
Cholesterol: 252 mg/dL — ABNORMAL HIGH (ref 0–200)
HDL: 46.8 mg/dL (ref >39.00)
NonHDL: 205.03
Triglycerides: 277 mg/dL — ABNORMAL HIGH (ref 0.0–149.0)
VLDL: 55.4 mg/dL — AB (ref 0.0–40.0)

## 2017-04-27 LAB — CBC WITH DIFFERENTIAL/PLATELET
BASOS ABS: 0.1 10*3/uL (ref 0.0–0.1)
BASOS PCT: 1.3 % (ref 0.0–3.0)
EOS ABS: 0.2 10*3/uL (ref 0.0–0.7)
EOS PCT: 2.8 % (ref 0.0–5.0)
HEMATOCRIT: 45.6 % (ref 36.0–46.0)
HEMOGLOBIN: 14.9 g/dL (ref 12.0–15.0)
LYMPHS ABS: 1.9 10*3/uL (ref 0.7–4.0)
Lymphocytes Relative: 33.5 % (ref 12.0–46.0)
MCHC: 32.6 g/dL (ref 30.0–36.0)
MCV: 87 fl (ref 78.0–100.0)
MONO ABS: 0.4 10*3/uL (ref 0.1–1.0)
Monocytes Relative: 6.4 % (ref 3.0–12.0)
Neutro Abs: 3.2 10*3/uL (ref 1.4–7.7)
Neutrophils Relative %: 56 % (ref 43.0–77.0)
PLATELETS: 271 10*3/uL (ref 150.0–400.0)
RBC: 5.24 Mil/uL — ABNORMAL HIGH (ref 3.87–5.11)
RDW: 14.5 % (ref 11.5–15.5)
WBC: 5.6 10*3/uL (ref 4.0–10.5)

## 2017-04-27 LAB — TSH: TSH: 2.1 u[IU]/mL (ref 0.35–4.50)

## 2017-04-27 LAB — VITAMIN D 25 HYDROXY (VIT D DEFICIENCY, FRACTURES): VITD: 108.31 ng/mL — AB (ref 30.00–100.00)

## 2017-04-27 LAB — LDL CHOLESTEROL, DIRECT: Direct LDL: 160 mg/dL

## 2017-04-27 LAB — HEMOGLOBIN A1C: Hgb A1c MFr Bld: 6.2 % (ref 4.6–6.5)

## 2017-04-27 NOTE — Telephone Encounter (Signed)
Call patient .  Have her stop all supplements that contain Vitamin  D she will have to read the labels on her calcium and MVI  Repeat level in 2 weeks

## 2017-04-27 NOTE — Telephone Encounter (Signed)
CRITICAL VALUE STICKER  CRITICAL VALUE: Vitamin D- 108.31  RECEIVER (on-site recipient of call): Jari Favre, CMA  DATE & TIME NOTIFIED: 04/27/17 @ 2:27pm  MESSENGER (representative from lab): [place] @ [place] lab  MD NOTIFIED: Dr. Derrel Nip  TIME OF NOTIFICATION: 2:30pm  RESPONSE: (see note)

## 2017-04-27 NOTE — Telephone Encounter (Signed)
Spoke with pt and informed her of her lab results. Also explained to pt that she needs to stop taking all medication that contains vitamin D. Pt gave a verbal understanding. Scheduled pt a follow up lab appt. Pt is aware of appt date and time.

## 2017-04-29 ENCOUNTER — Encounter: Payer: Self-pay | Admitting: Internal Medicine

## 2017-04-29 ENCOUNTER — Ambulatory Visit (INDEPENDENT_AMBULATORY_CARE_PROVIDER_SITE_OTHER): Payer: Medicare Other | Admitting: Internal Medicine

## 2017-04-29 DIAGNOSIS — I10 Essential (primary) hypertension: Secondary | ICD-10-CM | POA: Diagnosis not present

## 2017-04-29 DIAGNOSIS — Z23 Encounter for immunization: Secondary | ICD-10-CM

## 2017-04-29 DIAGNOSIS — Z0000 Encounter for general adult medical examination without abnormal findings: Secondary | ICD-10-CM

## 2017-04-29 DIAGNOSIS — E785 Hyperlipidemia, unspecified: Secondary | ICD-10-CM | POA: Diagnosis not present

## 2017-04-29 MED ORDER — EVOLOCUMAB 140 MG/ML ~~LOC~~ SOAJ: 140.0000 mg | SUBCUTANEOUS | 11 refills | Status: DC

## 2017-04-29 NOTE — Patient Instructions (Addendum)
Your  Fasting glucose is elevated but not diagnostic of diabetes; however your a1c is 6.2  this means that you are at risk for developing type 2 Diabetes.  I recommend continued efforts at lowering your sugar intake using a low glycemic index diet Regular participation in some form of  aerobic exercise .  I would like to see you again after we repeat your fasting labs in 6 months.   You have declined the pneumonia vaccine that is recommended   You have declined starting Repatha;  Please talk to your cardiologist about it .  To help reduce your carbohydrate/sugar intake:   Try the Heritage Flakes cereal available at [facility] on the bottom shelf, with vanilla flavored almond milk or skim milk   Recommended Low Carb high Protein premixed Shakes:   Premier Protein  Atkins Advantage Muscle Milk EAS AdvantEdge   All of these are available at [employer], [place], and [employer]  And taste good   Danton Clap now makes a frozen breakfast frittata that can be microwaved in 2 minutes and is very low carb. Frittats are similar to quiches without the crust     To make a low carb chip :  Take the Joseph's Lavash or Pita bread,  Or the Mission Low carb whole wheat tortilla   Place on metal cookie sheet  Brush with olive oil  Sprinkle garlic powder (NOT garlic salt), grated parmesan cheese, mediterranean seasoning , or all of them?  Bake at 275 for 30 minutes   We have substitutions for your potatoes!!  Try the mashed cauliflower and riced cauliflower dishes instead of rice and mashed potatoes  Mashed turnips are also very low carb!   For desserts :  Try the Dannon Lt n Fit greek yogurt dessert flavors and top with reddi Whip .  8 carbs,  80 calories  Try Oikos Triple Zero [place] Yogurt in the salted caramel, and the coffee flavors  With Whipped Cream for dessert  breyer's low carb ice cream, available in bars (on a stick, better ) or scoopable ice cream  HERE ARE THE LOW  CARB  BREAD CHOICES     _

## 2017-04-29 NOTE — Progress Notes (Signed)
Patient ID: Veronica Barron, female    DOB: 10/08/1940  Age: 76 y.o. MRN: [id number]  The patient is here for annual preventivve  examination and management of other chronic and acute problems.   mammogram done in May Labs reviewed , A1c has risen to 6.2 from 5.6 cholesterol still elevated  LDL 160 Does not want DEXA scan   Vit D level excessive  Thinks she was taking 2 tablets(1000 Ius daily since last check 2 years ago )    Due for  pneumovax     The risk factors are reflected in the social history.  The roster of all physicians providing medical care to patient - is listed in the Snapshot section of the chart.  Activities of daily living:  The patient is 100% independent in all ADLs: dressing, toileting, feeding as well as independent mobility  Home safety : The patient has smoke detectors in the home. They wear seatbelts.  There are no firearms at home. There is no violence in the home.   There is no risks for hepatitis, STDs or HIV. There is no   history of blood transfusion. They have no travel history to infectious disease endemic areas of the world.  The patient has seen their dentist in the last six month. They have seen their eye doctor in the last year. They have no  hearing difficulty  deferred audiologic testing in the last year.  They do not  have excessive sun exposure. Discussed the need for sun protection: hats, long sleeves and use of sunscreen if there is significant sun exposure.   Diet: the importance of a healthy diet is discussed. They do have a healthy diet.  The benefits of regular aerobic exercise were discussed. She walks 4 times per week ,  20 minutes.   Depression screen: there are no signs or vegative symptoms of depression- irritability, change in appetite, anhedonia, sadness/tearfullness.  Cognitive assessment: the patient manages all their financial and personal affairs and is actively engaged. They could relate day,date,year and events; recalled 2/3  objects at 3 minutes; performed clock-face test normally.  The following portions of the patient's history were reviewed and updated as appropriate: allergies, current medications, past family history, past medical history,  past surgical history, past social history  and problem list.  Visual acuity was not assessed per patient preference since she has regular follow up with her ophthalmologist. Hearing and body mass index were assessed and reviewed.   During the course of the visit the patient was educated and counseled about appropriate screening and preventive services including : fall prevention , diabetes screening, nutrition counseling, colorectal cancer screening, and recommended immunizations.    CC: Diagnoses of Encounter for immunization, Encounter for preventive health examination, and Essential hypertension were pertinent to this visit.  History Kiely has a past medical history of Allergic rhinitis, cause unspecified; CAD (coronary artery disease); Diverticulosis of colon; Hyperlipidemia; Hypertension; Ischemic cardiomyopathy; Leiomyoma of uterus, unspecified; and Unspecified hypothyroidism.   She has a past surgical history that includes Tubal ligation; Cholecystectomy; Abdominal hysterectomy; left heart catheterization with coronary angiogram (N/A, 03/16/2013); percutaneous coronary stent intervention (pci-s) (03/16/2013); and left heart catheterization with coronary angiogram (N/A, 05/23/2014).   Her family history includes Breast cancer in her maternal aunt; Coronary artery disease in her father; Dementia in her mother; Hyperlipidemia in her father and mother; Hypertension in her father and mother; Uterine cancer (age of onset: 70) in her sister.She reports that she has never smoked. She has never  used smokeless tobacco. She reports that she drinks alcohol. She reports that she does not use drugs.  Outpatient Medications Prior to Visit  Medication Sig Dispense Refill  . ALFALFA PO  Take 1 tablet by mouth daily.    Marland Kitchen aspirin EC 81 MG tablet Take 81 mg by mouth daily.    . carvedilol (COREG) 6.25 MG tablet TAKE 1 BY MOUTH TWICE DAILY WITH A MEAL 180 tablet 3  . clopidogrel (PLAVIX) 75 MG tablet Take 1 tablet (75 mg total) by mouth daily. 90 tablet 2  . clorazepate (TRANXENE-T) 7.5 MG tablet Take 1 tablet (7.5 mg total) by mouth at bedtime as needed for anxiety. 30 tablet 0  . levothyroxine (SYNTHROID, LEVOTHROID) 75 MCG tablet Take 1 tablet (75 mcg total) by mouth daily before breakfast. 90 tablet 0  . lisinopril (PRINIVIL,ZESTRIL) 5 MG tablet TAKE 1 BY MOUTH TWICE DAILY 180 tablet 3  . multivitamin-lutein (OCUVITE-LUTEIN) CAPS capsule Take 1 capsule by mouth daily.    . sertraline (ZOLOFT) 50 MG tablet Take 1 tablet (50 mg total) by mouth daily. 90 tablet 0  . vitamin B-12 (CYANOCOBALAMIN) 500 MCG tablet Take 500 mcg by mouth daily.    . vitamin C (ASCORBIC ACID) 500 MG tablet Take 500 mg by mouth daily.    . benzonatate (TESSALON) 100 MG capsule Take 1 capsule (100 mg total) by mouth 2 (two) times daily as needed for cough. (Patient not taking: Reported on 04/29/2017) 20 capsule 0  . calcium carbonate (OS-CAL) 600 MG TABS Take 600 mg by mouth daily.    . cefdinir (OMNICEF) 300 MG capsule Take 1 capsule (300 mg total) by mouth 2 (two) times daily. (Patient not taking: Reported on 04/29/2017) 20 capsule 0  . Omega-3 Fatty Acids (FISH OIL) 1000 MG CPDR Take 1 capsule by mouth daily.     No facility-administered medications prior to visit.     Review of Systems   Patient denies headache, fevers, malaise, unintentional weight loss, skin rash, eye pain, sinus congestion and sinus pain, sore throat, dysphagia,  hemoptysis , cough, dyspnea, wheezing, chest pain, palpitations, orthopnea, edema, abdominal pain, nausea, melena, diarrhea, constipation, flank pain, dysuria, hematuria, urinary  Frequency, nocturia, numbness, tingling, seizures,  Focal weakness, Loss of consciousness,   Tremor, insomnia, depression, anxiety, and suicidal ideation.     Objective:  BP (!) 146/74 (BP Location: Left Arm, Patient Position: Sitting, Cuff Size: Normal)   Pulse 62   Temp 98.6 F (37 C) (Oral)   Resp 15   Ht 5\' 3"  (1.6 m)   Wt 152 lb (68.9 kg)   SpO2 97%   BMI 26.93 kg/m   Physical Exam   General appearance: alert, cooperative and appears stated age Ears: normal TM's and external ear canals both ears Throat: lips, mucosa, and tongue normal; teeth and gums normal Neck: no adenopathy, no carotid bruit, supple, symmetrical, trachea midline and thyroid not enlarged, symmetric, no tenderness/mass/nodules Back: symmetric, no curvature. ROM normal. No CVA tenderness. Lungs: clear to auscultation bilaterally Heart: regular rate and rhythm, S1, S2 normal, no murmur, click, rub or gallop Abdomen: soft, non-tender; bowel sounds normal; no masses,  no organomegaly Pulses: 2+ and symmetric Skin: Skin color, texture, turgor normal. No rashes or lesions Lymph nodes: Cervical, supraclavicular, and axillary nodes normal.    Assessment & Plan:   Problem List Items Addressed This Visit    Encounter for preventive health examination    Annual comprehensive preventive exam was done as well as an  evaluation and management of chronic conditions .  During the course of the visit the patient was educated and counseled about appropriate screening and preventive services including :  diabetes screening, lipid analysis with projected  10 year  risk for CAD , nutrition counseling, breast, cervical and colorectal cancer screening, and recommended immunizations.  Printed recommendations for health maintenance screenings was given..  She has declined DEXA scan at this time.   Lab Results  Component Value Date   HGBA1C 6.2 04/27/2017   Lab Results  Component Value Date   CHOL 252 (H) 04/27/2017   HDL 46.80 04/27/2017   LDLCALC 154 (H) 11/09/2014   LDLDIRECT 160.0 04/27/2017   TRIG 277.0 (H)  04/27/2017   CHOLHDL 5 04/27/2017   Lab Results  Component Value Date   TSH 2.10 04/27/2017         Essential hypertension    Well controlled on current regimen. Renal function stable, no changes today. Lab Results  Component Value Date   CREATININE 0.83 04/27/2017    Lab Results  Component Value Date   NA 142 04/27/2017   K 4.4 04/27/2017   CL 105 04/27/2017   CO2 26 04/27/2017          Other Visit Diagnoses    Encounter for immunization       Relevant Orders   Flu vaccine HIGH DOSE PF (Completed)      I have discontinued Ms. Atienza's calcium carbonate, Fish Oil, benzonatate, cefdinir, and Evolocumab. I am also having her maintain her vitamin C, aspirin EC, multivitamin-lutein, ALFALFA PO, vitamin B-12, clorazepate, sertraline, clopidogrel, lisinopril, carvedilol, and levothyroxine.  Meds ordered this encounter  Medications  . DISCONTD: Evolocumab (REPATHA SURECLICK) 562 MG/ML SOAJ    Sig: Inject 140 mg into the skin every 14 (fourteen) days.    Dispense:  2 mL    Refill:  11    Medications Discontinued During This Encounter  Medication Reason  . benzonatate (TESSALON) 100 MG capsule Patient has not taken in last 30 days  . calcium carbonate (OS-CAL) 600 MG TABS Patient has not taken in last 30 days  . cefdinir (OMNICEF) 300 MG capsule Patient has not taken in last 30 days  . Omega-3 Fatty Acids (FISH OIL) 1000 MG CPDR Patient has not taken in last 30 days  . Evolocumab (REPATHA SURECLICK) 563 MG/ML SOAJ     Follow-up: No Follow-up on file.   [name], MD

## 2017-05-02 NOTE — Assessment & Plan Note (Signed)
Well controlled on current regimen. Renal function stable, no changes today. Lab Results  Component Value Date   CREATININE 0.83 04/27/2017    Lab Results  Component Value Date   NA 142 04/27/2017   K 4.4 04/27/2017   CL 105 04/27/2017   CO2 26 04/27/2017

## 2017-05-02 NOTE — Assessment & Plan Note (Signed)
Untreated due to statin intolerance and now, fenofibrate intolerance due to constipation.  Advised patient to consider trial of Repatha  .   She prefers to discuss this with her cardiologist.   Lab Results  Component Value Date   CHOL 252 (H) 04/27/2017   HDL 46.80 04/27/2017   LDLCALC 154 (H) 11/09/2014   LDLDIRECT 160.0 04/27/2017   TRIG 277.0 (H) 04/27/2017   CHOLHDL 5 04/27/2017

## 2017-05-02 NOTE — Assessment & Plan Note (Signed)
Annual comprehensive preventive exam was done as well as an evaluation and management of chronic conditions .  During the course of the visit the patient was educated and counseled about appropriate screening and preventive services including :  diabetes screening, lipid analysis with projected  10 year  risk for CAD , nutrition counseling, breast, cervical and colorectal cancer screening, and recommended immunizations.  Printed recommendations for health maintenance screenings was given..  She has declined DEXA scan at this time.   Lab Results  Component Value Date   HGBA1C 6.2 04/27/2017   Lab Results  Component Value Date   CHOL 252 (H) 04/27/2017   HDL 46.80 04/27/2017   LDLCALC 154 (H) 11/09/2014   LDLDIRECT 160.0 04/27/2017   TRIG 277.0 (H) 04/27/2017   CHOLHDL 5 04/27/2017   Lab Results  Component Value Date   TSH 2.10 04/27/2017

## 2017-05-04 ENCOUNTER — Telehealth: Payer: Self-pay | Admitting: Internal Medicine

## 2017-05-04 DIAGNOSIS — R7989 Other specified abnormal findings of blood chemistry: Secondary | ICD-10-CM

## 2017-05-04 NOTE — Telephone Encounter (Signed)
Pt called to inform about Vitamin D3 pt is taking is 125 mcg/5000 IU. Pt would like to get a copy of the whole panel of blood work when she comes in for her appt. Thank you!  Call pt @ [phone].

## 2017-05-05 NOTE — Telephone Encounter (Signed)
Spoke with pt and she stated that she has stopped taking the vitamin d and will be back in on the 29th to have it rechecked. The pt t stated that she had not heard anything form any of the other labs she had done. Please advise.

## 2017-05-05 NOTE — Telephone Encounter (Signed)
That's because we discussed them in detail at her visit

## 2017-05-07 NOTE — Telephone Encounter (Signed)
Vit D was reordered correctly (future)

## 2017-05-11 ENCOUNTER — Other Ambulatory Visit (INDEPENDENT_AMBULATORY_CARE_PROVIDER_SITE_OTHER): Payer: Medicare Other

## 2017-05-11 DIAGNOSIS — R7989 Other specified abnormal findings of blood chemistry: Secondary | ICD-10-CM

## 2017-05-11 LAB — VITAMIN D 25 HYDROXY (VIT D DEFICIENCY, FRACTURES): VITD: 82.89 ng/mL (ref 30.00–100.00)

## 2017-05-14 ENCOUNTER — Telehealth: Payer: Self-pay | Admitting: Internal Medicine

## 2017-05-14 NOTE — Telephone Encounter (Signed)
[facility] [phone] called regarding pt repatha needing to be validated. It was a voicemail left. Thank you!

## 2017-05-15 NOTE — Telephone Encounter (Signed)
Patient was not prescribed by me,  She wanted to talk to her cardiologist ABout it

## 2017-05-15 NOTE — Telephone Encounter (Signed)
I don't see where patient was started on Repatha does PCP want repatha

## 2017-05-18 NOTE — Telephone Encounter (Signed)
Notified patient.

## 2017-05-25 ENCOUNTER — Ambulatory Visit (INDEPENDENT_AMBULATORY_CARE_PROVIDER_SITE_OTHER): Payer: Medicare Other

## 2017-05-25 VITALS — BP 128/70 | HR 63 | Temp 97.9°F | Resp 14 | Ht 63.0 in | Wt 150.8 lb

## 2017-05-25 DIAGNOSIS — Z0000 Encounter for general adult medical examination without abnormal findings: Secondary | ICD-10-CM | POA: Diagnosis not present

## 2017-05-25 DIAGNOSIS — Z1331 Encounter for screening for depression: Secondary | ICD-10-CM

## 2017-05-25 DIAGNOSIS — Z23 Encounter for immunization: Secondary | ICD-10-CM

## 2017-05-25 NOTE — Patient Instructions (Addendum)
  Veronica Barron , Thank you for taking time to come for your Medicare Wellness Visit. I appreciate your ongoing commitment to your health goals. Please review the following plan we discussed and let me know if I can assist you in the future.   Follow up with Dr. Derrel Nip as needed.    Have a great day!  These are the goals we discussed:  Low carb foods   This is a list of the screening recommended for you and due dates:  Health Maintenance  Topic Date Due  . Mammogram  12/11/2018  . Tetanus Vaccine  03/10/2023  . Flu Shot  Completed  . DEXA scan (bone density measurement)  Completed  . Pneumonia vaccines  Completed

## 2017-05-25 NOTE — Progress Notes (Signed)
Subjective:   Veronica Barron is a 76 y.o. female who presents for Medicare Annual (Subsequent) preventive examination.  Review of Systems:  No ROS.  Medicare Wellness Visit. Additional risk factors are reflected in the social history. Cardiac Risk Factors include: hypertension;advanced age (>93mn, >>49women)     Objective:     Vitals: BP 128/70 (BP Location: Left Arm, Patient Position: Sitting, Cuff Size: Normal)   Pulse 63   Temp 97.9 F (36.6 C) (Oral)   Resp 14   Ht _0  (1.6 m)   Wt 150 lb 12.8 oz (68.4 kg)   SpO2 97%   BMI 26.71 kg/m   Body mass index is 26.71 kg/m.   Tobacco Social History   Tobacco Use  Smoking Status Never Smoker  Smokeless Tobacco Never Used     Counseling given: Not Answered   Past Medical History:  Diagnosis Date  . Allergic rhinitis, cause unspecified   . CAD (coronary artery disease)    a. 03/2013 Abnl MV, EF 29%;  b. 03/2013 Cath/PCI: >M nl, LAD 99p (2.5x28 Promus Prem DES), LCX 100 after large OM1, RCA dominant, 20p, EF 30%.  . Diverticulosis of colon   . Hyperlipidemia   . Hypertension   . Ischemic cardiomyopathy    a. 03/2013 EF 30% by LV gram.;  b. Echo (12/14): Mod LVH with severe basal septal hypertrophy w/o LVOT obstr, EF 50-55%, normal wall motion, Gr 1 DD, MAC, mild MR, mild LAE, trivial eff.  . Leiomyoma of uterus, unspecified   . Unspecified hypothyroidism    Past Surgical History:  Procedure Laterality Date  . ABDOMINAL HYSTERECTOMY     fibroid   at  age 76 . CHOLECYSTECTOMY    . TUBAL LIGATION     Family History  Problem Relation Age of Onset  . Dementia Mother   . Hyperlipidemia Mother   . Hypertension Mother   . Coronary artery disease Father   . Hypertension Father   . Hyperlipidemia Father   . Uterine cancer Sister 853 . Breast cancer Maternal Aunt    Social History   Substance and Sexual Activity  Sexual Activity Not Currently  . Birth control/protection: Post-menopausal    Outpatient  Encounter Medications as of 05/25/2017  Medication Sig  . ALFALFA PO Take 1 tablet by mouth daily.  .Marland Kitchenaspirin EC 81 MG tablet Take 81 mg by mouth daily.  . carvedilol (COREG) 6.25 MG tablet TAKE 1 BY MOUTH TWICE DAILY WITH A MEAL  . clopidogrel (PLAVIX) 75 MG tablet Take 1 tablet (75 mg total) by mouth daily.  . clorazepate (TRANXENE-T) 7.5 MG tablet Take 1 tablet (7.5 mg total) by mouth at bedtime as needed for anxiety.  .Marland Kitchenlevothyroxine (SYNTHROID, LEVOTHROID) 75 MCG tablet Take 1 tablet (75 mcg total) by mouth daily before breakfast.  . lisinopril (PRINIVIL,ZESTRIL) 5 MG tablet TAKE 1 BY MOUTH TWICE DAILY  . multivitamin-lutein (OCUVITE-LUTEIN) CAPS capsule Take 1 capsule by mouth daily.  . sertraline (ZOLOFT) 50 MG tablet Take 1 tablet (50 mg total) by mouth daily.  . vitamin B-12 (CYANOCOBALAMIN) 500 MCG tablet Take 500 mcg by mouth daily.  . vitamin C (ASCORBIC ACID) 500 MG tablet Take 500 mg by mouth daily.   No facility-administered encounter medications on file as of 05/25/2017.     Activities of Daily Living In your present state of health, do you have any difficulty performing the following activities: 05/25/2017  Hearing? N  Vision? N  Difficulty concentrating  or making decisions? N  Walking or climbing stairs? N  Dressing or bathing? N  Doing errands, shopping? N  Preparing Food and eating ? N  Using the Toilet? N  In the past six months, have you accidently leaked urine? Y  Comment Managed with a daily pad  Do you have problems with loss of bowel control? N  Managing your Medications? N  Managing your Finances? N  Housekeeping or managing your Housekeeping? N  Some recent data might be hidden    Patient Care Team: [name], MD as PCP - General (Internal Medicine) Judeth Horn, MD (General Surgery) Clent Jacks, MD (Ophthalmology)    Assessment:    This is a routine wellness examination for Yesli. The goal of the wellness visit is to assist the patient  how to close the gaps in care and create a preventative care plan for the patient.   The roster of all physicians providing medical care to patient is listed in the Snapshot section of the chart.  Taking calcium VIT D as appropriate/Osteoporosis risk reviewed.    Safety issues reviewed; Smoke and carbon monoxide detectors in the home. No firearms in the home.  Wears seatbelts when driving or riding with others. Patient does wear sunscreen or protective clothing when in direct sunlight. No violence in the home.  Depression- PHQ 2 &9 complete.  No signs/symptoms or verbal communication regarding little pleasure in doing things, feeling down, depressed or hopeless. No changes in sleeping, energy, eating, concentrating.  No thoughts of self harm or harm towards others.  Time spent on this topic is 9 minutes.   Patient is alert, normal appearance, oriented to person/place/and time. Correctly identified the president of the [place], recall of 3/3 words, and performing simple calculations. Displays appropriate judgement and can read correct time from watch face.   No new identified risk were noted.  No failures at ADL's or IADL's.    BMI- discussed the importance of a healthy diet, water intake and the benefits of aerobic exercise. Educational material provided.   24 hour diet recall: Low carb diet  Daily fluid intake: 0 cups of caffeine, 6 cups of water  Dental- every 6 months.  Dr. Kenton Kingfisher   Eye- Visual acuity not assessed per patient preference since they have regular follow up with the ophthalmologist.  Wears corrective lenses.  Sleep patterns- Sleeps 7-8 hours at night.  Wakes feeling rested.  Pneumovax 23 administered L deltoid, tolerated well. Educational material provided.  Health maintenance gaps- closed.  Patient Concerns: None at this time. Follow up with PCP as needed.  Exercise Activities and Dietary recommendations Current Exercise Habits: Home exercise routine, Type of  exercise: calisthenics;walking, Time (Minutes): 30, Frequency (Times/Week): 7, Weekly Exercise (Minutes/Week): 210, Intensity: Moderate Fall Risk Fall Risk  05/25/2017 02/02/2017 05/19/2016 04/24/2015 03/15/2014  Falls in the past year? _0    Depression Screen PHQ 2/9 Scores 05/25/2017 02/02/2017 05/19/2016 04/24/2015  PHQ - 2 Score 0 0 0 0  PHQ- 9 Score 0 - - -     Cognitive Function MMSE - Mini Mental State Exam 05/19/2016  Orientation to time 5  Orientation to Place 5  Registration 3  Attention/ Calculation 5  Recall 3  Language- name 2 objects 2  Language- repeat 1  Language- follow 3 step command 3  Language- read & follow direction 1  Write a sentence 1  Copy design 1  Total score 30     6CIT Screen 05/25/2017  What Year? 0 points  What month? 0 points  What time? 0 points  Count back from 20 0 points  Months in reverse 0 points  Repeat phrase 0 points  Total Score 0    Immunization History  Administered Date(s) Administered  . Influenza Whole 07/14/2007, 05/15/2009, 06/13/2010  . Influenza, High Dose Seasonal PF 04/29/2017  . Influenza,inj,Quad PF,6+ Mos 03/09/2013, 04/04/2014, 04/24/2015, 04/25/2016  . Influenza-Unspecified 05/19/2011  . Pneumococcal Conjugate-13 03/15/2014  . Pneumococcal Polysaccharide-23 05/25/2017  . Tetanus 03/09/2013  . Zoster 11/01/2007   Screening Tests Health Maintenance  Topic Date Due  . MAMMOGRAM  12/11/2018  . TETANUS/TDAP  03/10/2023  . INFLUENZA VACCINE  Completed  . DEXA SCAN  Completed  . PNA vac Low Risk Adult  Completed      Plan:    End of life planning; Advance aging; Advanced directives discussed. Copy of current HCPOA/Living Will on file.      I have personally reviewed and noted the following in the patient's chart:   . Medical and social history . Use of alcohol, tobacco or illicit drugs  . Current medications and supplements . Functional ability and status . Nutritional status . Physical  activity . Advanced directives . List of other physicians . Hospitalizations, surgeries, and ER visits in previous 12 months . Vitals . Screenings to include cognitive, depression, and falls . Referrals and appointments  In addition, I have reviewed and discussed with patient certain preventive protocols, quality metrics, and best practice recommendations. A written personalized care plan for preventive services as well as general preventive health recommendations were provided to patient.     OBrien-Blaney, Mirta Mally L, LPN  [id number]   I have reviewed the above information and agree with above.   Deborra Medina, MD

## 2017-05-26 DIAGNOSIS — H353131 Nonexudative age-related macular degeneration, bilateral, early dry stage: Secondary | ICD-10-CM | POA: Diagnosis not present

## 2017-05-26 DIAGNOSIS — H25813 Combined forms of age-related cataract, bilateral: Secondary | ICD-10-CM | POA: Diagnosis not present

## 2017-05-26 DIAGNOSIS — H35372 Puckering of macula, left eye: Secondary | ICD-10-CM | POA: Diagnosis not present

## 2017-06-16 ENCOUNTER — Other Ambulatory Visit: Payer: Self-pay

## 2017-06-16 MED ORDER — LEVOTHYROXINE SODIUM 75 MCG PO TABS: 75.0000 ug | ORAL_TABLET | Freq: Every day | ORAL | 1 refills | Status: DC

## 2017-07-16 ENCOUNTER — Telehealth: Payer: Self-pay | Admitting: Internal Medicine

## 2017-07-16 MED ORDER — SERTRALINE HCL 50 MG PO TABS: 50.0000 mg | ORAL_TABLET | Freq: Every day | ORAL | 0 refills | Status: DC

## 2017-07-16 NOTE — Telephone Encounter (Signed)
Pt requesting refill. Please advise °

## 2017-07-16 NOTE — Telephone Encounter (Signed)
Please advise 

## 2017-07-16 NOTE — Telephone Encounter (Signed)
Copied from [facility] [id number]. Topic: Quick Communication - Rx Refill/Question >> Jul 16, 2017  1:47 PM Yvette Rack wrote: Has the patient contacted their pharmacy? No.   (Agent: If no, request that the patient contact the pharmacy for the refill.) sertraline (ZOLOFT) 50 MG tablet    Preferred Pharmacy (with phone number or street name): [place], [place] [phone] (Phone) [phone] (Fax)     Agent: Please be advised that RX refills may take up to 3 business days. We ask that you follow-up with your pharmacy.

## 2017-07-16 NOTE — Telephone Encounter (Signed)
Mediation has been refilled.

## 2017-07-16 NOTE — Telephone Encounter (Unsigned)
Copied from [place] [id number]. Topic: Quick Communication - Rx Refill/Question >> Jul 16, 2017  1:47 PM Yvette Rack wrote: Has the patient contacted their pharmacy? No.   (Agent: If no, request that the patient contact the pharmacy for the refill.) sertraline (ZOLOFT) 50 MG tablet    Preferred Pharmacy (with phone number or street name): [facility], [place] [phone] (Phone) [phone] (Fax)     Agent: Please be advised that RX refills may take up to 3 business days. We ask that you follow-up with your pharmacy.

## 2017-07-18 NOTE — Progress Notes (Signed)
Veronica Barron Date of Birth: 07-12-1941 Medical Record #[id number]  History of Present Illness: Veronica Barron is seen today for followup CAD. She has a history of hypertension, hyperlipidemia, and hypothyroidism. In Sept 2014 she had stenting of the proximal LAD with DES.  The distal left circumflex was occluded with left to left collaterals and right-to-left collaterals. The right coronary was without significant disease. Ejection fraction was 30%.  Her other disease was treated medically. On followup Echo in Dec. 2014 EF had improved to 50-55%. In November 2015 she had a significant episode of chest pain. Repeat cardiac cath showed occlusion of the distal LCx and the stent in the LAD was patent.  She has a history of intolerance to multiple statins, Zetia and Lopid, and fenofibrates. She has been unable to afford a PCSK 9 inhibitor.  She reports she is feeling very well. No chest pain or SOB. Walks daily and does yard work. She raked leaves for 2 hours yesterday. She denies any dizziness or palpitations. Reports BP at home in normal range.   Current Outpatient Medications on File Prior to Visit  Medication Sig Dispense Refill  . ALFALFA PO Take 1 tablet by mouth daily.    Marland Kitchen aspirin EC 81 MG tablet Take 81 mg by mouth daily.    . carvedilol (COREG) 6.25 MG tablet TAKE 1 BY MOUTH TWICE DAILY WITH A MEAL 180 tablet 3  . clopidogrel (PLAVIX) 75 MG tablet Take 1 tablet (75 mg total) by mouth daily. 90 tablet 2  . clorazepate (TRANXENE-T) 7.5 MG tablet Take 1 tablet (7.5 mg total) by mouth at bedtime as needed for anxiety. 30 tablet 0  . levothyroxine (SYNTHROID, LEVOTHROID) 75 MCG tablet Take 1 tablet (75 mcg total) by mouth daily before breakfast. 90 tablet 1  . lisinopril (PRINIVIL,ZESTRIL) 5 MG tablet TAKE 1 BY MOUTH TWICE DAILY 180 tablet 3  . multivitamin-lutein (OCUVITE-LUTEIN) CAPS capsule Take 1 capsule by mouth daily.    . sertraline (ZOLOFT) 50 MG tablet Take 1 tablet (50 mg total) by  mouth daily. 90 tablet 0  . vitamin B-12 (CYANOCOBALAMIN) 500 MCG tablet Take 500 mcg by mouth daily.    . vitamin C (ASCORBIC ACID) 500 MG tablet Take 500 mg by mouth daily.     No current facility-administered medications on file prior to visit.     Allergies  Allergen Reactions  . Diphenhydramine Hcl     REACTION: TACHYCARDIA  . Fenofibrate Other (See Comments)    constipation  . Statins     Myalgia   . Zetia [Ezetimibe] Other (See Comments)    Myalgias     Past Medical History:  Diagnosis Date  . Allergic rhinitis, cause unspecified   . CAD (coronary artery disease)    a. 03/2013 Abnl MV, EF 29%;  b. 03/2013 Cath/PCI: >M nl, LAD 99p (2.5x28 Promus Prem DES), LCX 100 after large OM1, RCA dominant, 20p, EF 30%.  . Diverticulosis of colon   . Hyperlipidemia   . Hypertension   . Ischemic cardiomyopathy    a. 03/2013 EF 30% by LV gram.;  b. Echo (12/14): Mod LVH with severe basal septal hypertrophy w/o LVOT obstr, EF 50-55%, normal wall motion, Gr 1 DD, MAC, mild MR, mild LAE, trivial eff.  . Leiomyoma of uterus, unspecified   . Unspecified hypothyroidism     Past Surgical History:  Procedure Laterality Date  . ABDOMINAL HYSTERECTOMY     fibroid   at  age 34  . CHOLECYSTECTOMY    .  LEFT HEART CATHETERIZATION WITH CORONARY ANGIOGRAM N/A 03/16/2013   Procedure: LEFT HEART CATHETERIZATION WITH CORONARY ANGIOGRAM;  Surgeon: [name] [name], MD;  Location: [facility] CATH LAB;  Service: Cardiovascular;  Laterality: N/A;  . LEFT HEART CATHETERIZATION WITH CORONARY ANGIOGRAM N/A 05/23/2014   Procedure: LEFT HEART CATHETERIZATION WITH CORONARY ANGIOGRAM;  Surgeon: [name] [name], MD;  Location: [facility] CATH LAB;  Service: Cardiovascular;  Laterality: N/A;  . PERCUTANEOUS CORONARY STENT INTERVENTION (PCI-S)  03/16/2013   Procedure: PERCUTANEOUS CORONARY STENT INTERVENTION (PCI-S);  Surgeon: [name] [name], MD;  Location: [facility] CATH LAB;  Service: Cardiovascular;;  . TUBAL LIGATION      Social History    Tobacco Use  Smoking Status Never Smoker  Smokeless Tobacco Never Used    Social History   Substance and Sexual Activity  Alcohol Use Yes   Comment: wine    Family History  Problem Relation Age of Onset  . Dementia Mother   . Hyperlipidemia Mother   . Hypertension Mother   . Coronary artery disease Father   . Hypertension Father   . Hyperlipidemia Father   . Uterine cancer Sister 58  . Breast cancer Maternal Aunt     Review of Systems: As noted in history of present illness.  All other systems were reviewed and are negative.  Physical Exam: BP (!) 157/76   Pulse 65   Ht 5' 3"  (1.6 m)   Wt 151 lb (68.5 kg)   SpO2 97%   BMI 26.75 kg/m  GENERAL:  Well appearing HEENT:  PERRL, EOMI, sclera are clear. Oropharynx is clear. NECK:  No jugular venous distention, carotid upstroke brisk and symmetric, no bruits, no thyromegaly or adenopathy LUNGS:  Clear to auscultation bilaterally CHEST:  Unremarkable HEART:  RRR,  PMI not displaced or sustained,S1 and S2 within normal limits, no S3, no S4: no clicks, no rubs, no murmurs ABD:  Soft, nontender. BS +, no masses or bruits. No hepatomegaly, no splenomegaly EXT:  2 + pulses throughout, no edema, no cyanosis no clubbing SKIN:  Warm and dry.  No rashes NEURO:  Alert and oriented x 3. Cranial nerves II through XII intact. PSYCH:  Cognitively intact  '  LABORATORY DATA: Lab Results  Component Value Date   WBC 5.6 04/27/2017   HGB 14.9 04/27/2017   HCT 45.6 04/27/2017   PLT 271.0 04/27/2017   GLUCOSE 113 (H) 04/27/2017   CHOL 252 (H) 04/27/2017   TRIG 277.0 (H) 04/27/2017   HDL 46.80 04/27/2017   LDLDIRECT 160.0 04/27/2017   LDLCALC 154 (H) 11/09/2014   ALT 16 04/27/2017   AST 19 04/27/2017   NA 142 04/27/2017   K 4.4 04/27/2017   CL 105 04/27/2017   CREATININE 0.83 04/27/2017   BUN 14 04/27/2017   CO2 26 04/27/2017   TSH 2.10 04/27/2017   INR 0.92 05/17/2014   HGBA1C 6.2 04/27/2017    Assessment / Plan: 1.  Coronary disease status post stenting of the proximal LAD in September 2014 with drug-eluting stent. 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 Cardiac cath in November 2015 showed continued stent patency. She is asymptomatic.  Continue current therapy.   2. Hyperlipidemia. History of intolerance to multiple statins, Zetia, fenofibrates, and Lopid. Continue  dietary modifications.  Will see if cost of PCSK 9 will come down this year.  3. Hypertension- good control. Continue Rx.  4. Hypothyroidism.

## 2017-07-20 ENCOUNTER — Ambulatory Visit: Payer: Medicare Other | Admitting: Cardiology

## 2017-07-20 ENCOUNTER — Encounter: Payer: Self-pay | Admitting: Cardiology

## 2017-07-20 VITALS — BP 157/76 | HR 65 | Ht 63.0 in | Wt 151.0 lb

## 2017-07-20 DIAGNOSIS — I2510 Atherosclerotic heart disease of native coronary artery without angina pectoris: Secondary | ICD-10-CM | POA: Diagnosis not present

## 2017-07-20 DIAGNOSIS — E7800 Pure hypercholesterolemia, unspecified: Secondary | ICD-10-CM

## 2017-07-20 DIAGNOSIS — I10 Essential (primary) hypertension: Secondary | ICD-10-CM

## 2017-07-20 NOTE — Patient Instructions (Addendum)
Continue your current therapy  I will see you in 6 months.   

## 2017-07-27 ENCOUNTER — Other Ambulatory Visit: Payer: Self-pay

## 2017-07-27 MED ORDER — CLOPIDOGREL BISULFATE 75 MG PO TABS: 75.0000 mg | ORAL_TABLET | Freq: Every day | ORAL | 3 refills | Status: DC

## 2017-10-14 ENCOUNTER — Telehealth: Payer: Self-pay | Admitting: Internal Medicine

## 2017-10-14 MED ORDER — SERTRALINE HCL 50 MG PO TABS: 50.0000 mg | ORAL_TABLET | Freq: Every day | ORAL | 1 refills | Status: DC

## 2017-10-14 MED ORDER — SERTRALINE HCL 50 MG PO TABS: 50.0000 mg | ORAL_TABLET | Freq: Every day | ORAL | 0 refills | Status: DC

## 2017-10-14 NOTE — Telephone Encounter (Signed)
Copied from [place] [id number]. Topic: Quick Communication - See Telephone Encounter >> Oct 14, 2017  2:15 PM Conception Chancy, NT wrote: CRM for notification. See Telephone encounter for: 10/14/17.  Patient is requesting a refill on sertraline (ZOLOFT) 50 MG tablet. She states the company has contact the office multiple times. She states she goes through this every time and does not know what the hold up is. She states her other doctors right the RX for a year out. She would like to know how can she get this prescription without taking the extra steps every month. Please advise. She would like a 30 day supply sent to   [place], [place] [address] [address] Phone: [phone] Fax: [phone]  She states she usually uses   [facility] [place], [place] [address] Phone: [phone] Fax: [phone]

## 2017-10-14 NOTE — Telephone Encounter (Signed)
Medication has been sent to both local and mail order

## 2017-10-14 NOTE — Telephone Encounter (Signed)
Please advise. It looks like this was filled on 1/19 with a 90 day supply and sent to mail order but now she wants it sent locally.

## 2017-10-28 ENCOUNTER — Encounter: Payer: Self-pay | Admitting: Internal Medicine

## 2017-10-28 ENCOUNTER — Ambulatory Visit: Payer: Medicare Other | Admitting: Internal Medicine

## 2017-10-28 VITALS — BP 120/82 | HR 61 | Temp 98.2°F | Resp 15 | Ht 63.0 in | Wt 153.4 lb

## 2017-10-28 DIAGNOSIS — E039 Hypothyroidism, unspecified: Secondary | ICD-10-CM

## 2017-10-28 DIAGNOSIS — E7800 Pure hypercholesterolemia, unspecified: Secondary | ICD-10-CM

## 2017-10-28 DIAGNOSIS — E559 Vitamin D deficiency, unspecified: Secondary | ICD-10-CM | POA: Diagnosis not present

## 2017-10-28 DIAGNOSIS — I255 Ischemic cardiomyopathy: Secondary | ICD-10-CM

## 2017-10-28 DIAGNOSIS — I10 Essential (primary) hypertension: Secondary | ICD-10-CM | POA: Diagnosis not present

## 2017-10-28 DIAGNOSIS — R7303 Prediabetes: Secondary | ICD-10-CM

## 2017-10-28 DIAGNOSIS — R7301 Impaired fasting glucose: Secondary | ICD-10-CM

## 2017-10-28 LAB — COMPREHENSIVE METABOLIC PANEL
ALBUMIN: 4.1 g/dL (ref 3.5–5.2)
ALT: 19 U/L (ref 0–35)
AST: 22 U/L (ref 0–37)
Alkaline Phosphatase: 52 U/L (ref 39–117)
BUN: 12 mg/dL (ref 6–23)
CALCIUM: 9.3 mg/dL (ref 8.4–10.5)
CHLORIDE: 103 meq/L (ref 96–112)
CO2: 27 mEq/L (ref 19–32)
Creatinine, Ser: 0.75 mg/dL (ref 0.40–1.20)
GFR: 79.57 mL/min (ref >60.00)
Glucose, Bld: 113 mg/dL — ABNORMAL HIGH (ref 70–99)
Potassium: 4.4 mEq/L (ref 3.5–5.1)
SODIUM: 138 meq/L (ref 135–145)
Total Bilirubin: 0.5 mg/dL (ref 0.2–1.2)
Total Protein: 7 g/dL (ref 6.0–8.3)

## 2017-10-28 LAB — LIPID PANEL
CHOLESTEROL: 251 mg/dL — AB (ref 0–200)
HDL: 54.6 mg/dL (ref >39.00)
NonHDL: 196.25
Total CHOL/HDL Ratio: 5
Triglycerides: 212 mg/dL — ABNORMAL HIGH (ref 0.0–149.0)
VLDL: 42.4 mg/dL — AB (ref 0.0–40.0)

## 2017-10-28 LAB — HEMOGLOBIN A1C: HEMOGLOBIN A1C: 6.4 % (ref 4.6–6.5)

## 2017-10-28 LAB — VITAMIN D 25 HYDROXY (VIT D DEFICIENCY, FRACTURES): VITD: 49.86 ng/mL (ref 30.00–100.00)

## 2017-10-28 LAB — LDL CHOLESTEROL, DIRECT: Direct LDL: 174 mg/dL

## 2017-10-28 LAB — TSH: TSH: 1.89 u[IU]/mL (ref 0.35–4.50)

## 2017-10-28 NOTE — Patient Instructions (Addendum)
Your  fasting glucose has never been  diagnostic of diabetes; but your A1c continues to suggest that  you are at risk for developing type 2 Diabetes.  Following  A Mediterranean style diet and Continuing the excellent lifestyle changes that you have made should delay the progression to diabetes for a long time.     I would like to see you again  In 6 months  Here are several  Alternative meals that will cut carbs from your diet:  Low carb Breakfast options:  Premier Protein  Atkins Advantage Muscle Milk EAS AdvantEdge   All of these are available at [employer], [place],  [place] a And taste good   [place] Clap "D'Light" frozen entrees:    Frittata  : muffin shaped quiches that contain eggs + cheese + veggies+ meat (no bread) E'which:  Like a biscuit bu made without bread,  can be microwaved in 2 minutes   Truett Perna:  Just Crack n Egg: yogurt sized cup ith diced ham, cheese and potatoes: add one egg and microwave for 2 minutes   For lunch:   [employer] bagged salads:  Try  the Asian, the SouthWestern,  And the Caesar salads, complete with dressings, nuts and croutons .  Found  in the baggtshed salad section   Just add a protein (tuna,  Chicken etc ) and omit the won ton [place] strips

## 2017-10-28 NOTE — Progress Notes (Signed)
Subjective:  Patient ID: Veronica Barron, female    DOB: 03/31/1941  Age: 77 y.o. MRN: [id number]  CC: The primary encounter diagnosis was Vitamin D insufficiency. Diagnoses of Cardiomyopathy, ischemic, Essential hypertension, Hypothyroidism, unspecified type, Prediabetes, Pure hypercholesterolemia, and Impaired fasting glucose were also pertinent to this visit.  HPI Veronica Barron presents for follow up on hypertension,  Prediabetes, hyperlipidemia, statin intolerance,  CAD with  ischemic cardiomyopathy   CAD:  She remains asymptomatic with nonexertional activities and with gardening and walking .  Her last cardiac cath was in Nov  2015 due to exertional dyspnea,  abnormal myoview with drop  in EF to 35%.  cath confirmed that the   LAD stent placed 9/14 still patent, there were no other significant occlusions other than the chronic distal LAD  LCx , and EF was estimated at 45%      DISCUSSED TRYING TO GET REPATHA AGAIN   Outpatient Medications Prior to Visit  Medication Sig Dispense Refill  . ALFALFA PO Take 1 tablet by mouth daily.    Marland Kitchen aspirin EC 81 MG tablet Take 81 mg by mouth daily.    . carvedilol (COREG) 6.25 MG tablet TAKE 1 BY MOUTH TWICE DAILY WITH A MEAL 180 tablet 3  . clopidogrel (PLAVIX) 75 MG tablet Take 1 tablet (75 mg total) by mouth daily. 90 tablet 3  . clorazepate (TRANXENE-T) 7.5 MG tablet Take 1 tablet (7.5 mg total) by mouth at bedtime as needed for anxiety. 30 tablet 0  . levothyroxine (SYNTHROID, LEVOTHROID) 75 MCG tablet Take 1 tablet (75 mcg total) by mouth daily before breakfast. 90 tablet 1  . lisinopril (PRINIVIL,ZESTRIL) 5 MG tablet TAKE 1 BY MOUTH TWICE DAILY 180 tablet 3  . multivitamin-lutein (OCUVITE-LUTEIN) CAPS capsule Take 1 capsule by mouth daily.    . sertraline (ZOLOFT) 50 MG tablet Take 1 tablet (50 mg total) by mouth daily. 90 tablet 1  . vitamin B-12 (CYANOCOBALAMIN) 500 MCG tablet Take 500 mcg by mouth daily.    . vitamin C (ASCORBIC ACID) 500  MG tablet Take 500 mg by mouth daily.     No facility-administered medications prior to visit.     Review of Systems;  Patient denies headache, fevers, malaise, unintentional weight loss, skin rash, eye pain, sinus congestion and sinus pain, sore throat, dysphagia,  hemoptysis , cough, dyspnea, wheezing, chest pain, palpitations, orthopnea, edema, abdominal pain, nausea, melena, diarrhea, constipation, flank pain, dysuria, hematuria, urinary  Frequency, nocturia, numbness, tingling, seizures,  Focal weakness, Loss of consciousness,  Tremor, insomnia, depression, anxiety, and suicidal ideation.      Objective:  BP 120/82 (BP Location: Left Arm, Patient Position: Sitting, Cuff Size: Normal)   Pulse 61   Temp 98.2 F (36.8 C) (Oral)   Resp 15   Ht 5\' 3"  (1.6 m)   Wt 153 lb 6.4 oz (69.6 kg)   SpO2 97%   BMI 27.17 kg/m   BP Readings from Last 3 Encounters:  10/28/17 120/82  07/20/17 (!) 157/76  05/25/17 128/70    Wt Readings from Last 3 Encounters:  10/28/17 153 lb 6.4 oz (69.6 kg)  07/20/17 151 lb (68.5 kg)  05/25/17 150 lb 12.8 oz (68.4 kg)    General appearance: alert, cooperative and appears stated age Ears: normal TM's and external ear canals both ears Throat: lips, mucosa, and tongue normal; teeth and gums normal Neck: no adenopathy, no carotid bruit, supple, symmetrical, trachea midline and thyroid not enlarged, symmetric, no tenderness/mass/nodules Back:  symmetric, no curvature. ROM normal. No CVA tenderness. Lungs: clear to auscultation bilaterally Heart: regular rate and rhythm, S1, S2 normal, no murmur, click, rub or gallop Abdomen: soft, non-tender; bowel sounds normal; no masses,  no organomegaly Pulses: 2+ and symmetric Skin: Skin color, texture, turgor normal. No rashes or lesions Lymph nodes: Cervical, supraclavicular, and axillary nodes normal.  Lab Results  Component Value Date   HGBA1C 6.4 10/28/2017   HGBA1C 6.2 04/27/2017   HGBA1C 5.6 04/25/2016     Lab Results  Component Value Date   CREATININE 0.75 10/28/2017   CREATININE 0.83 04/27/2017   CREATININE 0.87 02/27/2016    Lab Results  Component Value Date   WBC 5.6 04/27/2017   HGB 14.9 04/27/2017   HCT 45.6 04/27/2017   PLT 271.0 04/27/2017   GLUCOSE 113 (H) 10/28/2017   CHOL 251 (H) 10/28/2017   TRIG 212.0 (H) 10/28/2017   HDL 54.60 10/28/2017   LDLDIRECT 174.0 10/28/2017   LDLCALC 154 (H) 11/09/2014   ALT 19 10/28/2017   AST 22 10/28/2017   NA 138 10/28/2017   K 4.4 10/28/2017   CL 103 10/28/2017   CREATININE 0.75 10/28/2017   BUN 12 10/28/2017   CO2 27 10/28/2017   TSH 1.89 10/28/2017   INR 0.92 05/17/2014   HGBA1C 6.4 10/28/2017    Mm Digital Screening Bilateral  Result Date: 12/10/2016 CLINICAL DATA:  Screening. EXAM: DIGITAL SCREENING BILATERAL MAMMOGRAM WITH CAD COMPARISON:  Previous exam(s). ACR Breast Density Category b: There are scattered areas of fibroglandular density. FINDINGS: There are no findings suspicious for malignancy. Images were processed with CAD. IMPRESSION: No mammographic evidence of malignancy. A result letter of this screening mammogram will be mailed directly to the patient. RECOMMENDATION: Screening mammogram in one year. (Code:SM-B-01Y) BI-RADS CATEGORY  1: Negative. Electronically Signed   By: Everlean Alstrom M.D.   On: 12/10/2016 16:56    Assessment & Plan:   Problem List Items Addressed This Visit    Vitamin D insufficiency - Primary    With previous supratherapeutic levels due to overuse of otc supplements. Level is normal. Reminded today that mega doses are not necessary        Relevant Orders   VITAMIN D 25 Hydroxy (Vit-D Deficiency, Fractures) (Completed)   Impaired fasting glucose    Her a1c is now 6.4  Fasting glucose was 120.  I have recommended that patient start exercising with a goal of 30 minutes of aerobic exercise a minimum of 5 days per week. And reduce the starches in her diet, in favor of a Mediterranean   Low glycemic index diet   Lab Results  Component Value Date   HGBA1C 6.4 10/28/2017         Hypothyroidism    Thyroid function is WNL on current dose.  No current changes needed.   Lab Results  Component Value Date   TSH 1.89 10/28/2017         Relevant Orders   TSH (Completed)   Hyperlipidemia    With known CAD s/p LAD stent In 2014.  Untreated due to statin intolerance and now, fenofibrate intolerance due to constipation. At last visit I advised patient to consider trial of Repatha.  She deferred to her cardiologist, who has agreed that if the medication is affordable,  She should consider taking it.    Lab Results  Component Value Date   CHOL 251 (H) 10/28/2017   HDL 54.60 10/28/2017   LDLCALC 154 (H) 11/09/2014   LDLDIRECT 174.0  10/28/2017   TRIG 212.0 (H) 10/28/2017   CHOLHDL 5 10/28/2017         Essential hypertension    Well controlled on current regimen. Renal function stable, no changes today.  Lab Results  Component Value Date   CREATININE 0.75 10/28/2017         Relevant Orders   Comprehensive metabolic panel (Completed)   Cardiomyopathy, ischemic    Her last cardiac cath was in Nov  2015 due to exertional dyspnea,  abnormal myoview with drop  in EF to 35%.  cath confirmed that the   LAD stent placed 9/14 still patent, there were no other significant occlusions other than the chronic distal LAD  LCx , and EF was estimated at 45%          Relevant Orders   Lipid panel (Completed)    Other Visit Diagnoses    Prediabetes       Relevant Orders   Hemoglobin A1c (Completed)    A total of 25 minutes of face to face time was spent with patient more than half of which was spent in counselling about the above mentioned conditions  and coordination of care   I am having Jace A. Mcerlean maintain her vitamin C, aspirin EC, multivitamin-lutein, ALFALFA PO, vitamin B-12, clorazepate, lisinopril, carvedilol, levothyroxine, clopidogrel, and sertraline.  No  orders of the defined types were placed in this encounter.   There are no discontinued medications.  Follow-up: Return in about 6 months (around 04/29/2018) for fasting labs prior .   [name], MD

## 2017-10-30 NOTE — Assessment & Plan Note (Signed)
Thyroid function is WNL on current dose.  No current changes needed.   Lab Results  Component Value Date   TSH 1.89 10/28/2017

## 2017-10-30 NOTE — Assessment & Plan Note (Signed)
Well controlled on current regimen. Renal function stable, no changes today.  Lab Results  Component Value Date   CREATININE 0.75 10/28/2017

## 2017-10-30 NOTE — Assessment & Plan Note (Addendum)
With previous supratherapeutic levels due to overuse of otc supplements. Level is normal. Reminded today that mega doses are not necessary

## 2017-10-30 NOTE — Assessment & Plan Note (Signed)
With known CAD s/p LAD stent In 2014.  Untreated due to statin intolerance and now, fenofibrate intolerance due to constipation. At last visit I advised patient to consider trial of Repatha.  She deferred to her cardiologist, who has agreed that if the medication is affordable,  She should consider taking it.    Lab Results  Component Value Date   CHOL 251 (H) 10/28/2017   HDL 54.60 10/28/2017   LDLCALC 154 (H) 11/09/2014   LDLDIRECT 174.0 10/28/2017   TRIG 212.0 (H) 10/28/2017   CHOLHDL 5 10/28/2017

## 2017-10-30 NOTE — Assessment & Plan Note (Signed)
Her last cardiac cath was in Nov  2015 due to exertional dyspnea,  abnormal myoview with drop  in EF to 35%.  cath confirmed that the   LAD stent placed 9/14 still patent, there were no other significant occlusions other than the chronic distal LAD  LCx , and EF was estimated at 45%

## 2017-10-30 NOTE — Assessment & Plan Note (Signed)
Her a1c is now 6.4  Fasting glucose was 120.  I have recommended that patient start exercising with a goal of 30 minutes of aerobic exercise a minimum of 5 days per week. And reduce the starches in her diet, in favor of a Mediterranean  Low glycemic index diet   Lab Results  Component Value Date   HGBA1C 6.4 10/28/2017

## 2017-11-13 ENCOUNTER — Other Ambulatory Visit: Payer: Self-pay | Admitting: Internal Medicine

## 2017-11-13 DIAGNOSIS — Z1231 Encounter for screening mammogram for malignant neoplasm of breast: Secondary | ICD-10-CM

## 2017-11-24 ENCOUNTER — Other Ambulatory Visit: Payer: Self-pay | Admitting: Internal Medicine

## 2017-11-26 ENCOUNTER — Other Ambulatory Visit: Payer: Self-pay | Admitting: Cardiology

## 2017-11-27 NOTE — Telephone Encounter (Signed)
Rx sent to pharmacy   

## 2017-12-14 ENCOUNTER — Ambulatory Visit: Payer: Medicare Other

## 2017-12-25 ENCOUNTER — Other Ambulatory Visit: Payer: Self-pay | Admitting: Cardiology

## 2018-01-08 ENCOUNTER — Ambulatory Visit: Payer: Medicare Other

## 2018-01-08 ENCOUNTER — Ambulatory Visit
Admission: RE | Admit: 2018-01-08 | Discharge: 2018-01-08 | Disposition: A | Discharge status: Home / Self Care | Payer: Medicare Other | Source: Ambulatory Visit | Attending: Internal Medicine | Admitting: Internal Medicine

## 2018-01-08 DIAGNOSIS — Z1231 Encounter for screening mammogram for malignant neoplasm of breast: Secondary | ICD-10-CM | POA: Diagnosis not present

## 2018-01-11 ENCOUNTER — Other Ambulatory Visit: Payer: Self-pay | Admitting: Internal Medicine

## 2018-01-11 DIAGNOSIS — R928 Other abnormal and inconclusive findings on diagnostic imaging of breast: Secondary | ICD-10-CM

## 2018-01-13 ENCOUNTER — Ambulatory Visit
Admission: RE | Admit: 2018-01-13 | Discharge: 2018-01-13 | Disposition: A | Discharge status: Home / Self Care | Payer: Medicare Other | Source: Ambulatory Visit | Attending: Internal Medicine | Admitting: Internal Medicine

## 2018-01-13 DIAGNOSIS — R928 Other abnormal and inconclusive findings on diagnostic imaging of breast: Secondary | ICD-10-CM

## 2018-01-13 DIAGNOSIS — N6001 Solitary cyst of right breast: Secondary | ICD-10-CM | POA: Diagnosis not present

## 2018-02-02 ENCOUNTER — Telehealth: Payer: Self-pay

## 2018-02-02 NOTE — Telephone Encounter (Signed)
error 

## 2018-02-02 NOTE — Progress Notes (Signed)
Pt stated that she discussed this with her cardiologist but that she has decided not to do it because her insurance will not cover the medication.

## 2018-02-22 ENCOUNTER — Ambulatory Visit (INDEPENDENT_AMBULATORY_CARE_PROVIDER_SITE_OTHER): Payer: Medicare Other | Admitting: Internal Medicine

## 2018-02-22 ENCOUNTER — Telehealth: Payer: Self-pay

## 2018-02-22 ENCOUNTER — Encounter: Payer: Self-pay | Admitting: Internal Medicine

## 2018-02-22 DIAGNOSIS — M109 Gout, unspecified: Secondary | ICD-10-CM | POA: Diagnosis not present

## 2018-02-22 DIAGNOSIS — M79672 Pain in left foot: Secondary | ICD-10-CM | POA: Diagnosis not present

## 2018-02-22 MED ORDER — OMEPRAZOLE 20 MG PO CPDR: 20.0000 mg | DELAYED_RELEASE_CAPSULE | Freq: Every day | ORAL | 0 refills | Status: DC

## 2018-02-22 MED ORDER — INDOMETHACIN 50 MG PO CAPS: 50.0000 mg | ORAL_CAPSULE | Freq: Three times a day (TID) | ORAL | 2 refills | Status: DC

## 2018-02-22 NOTE — Telephone Encounter (Signed)
Please send patient to lab upon arrival, before she is roomed.

## 2018-02-22 NOTE — Progress Notes (Signed)
Subjective:  Patient ID: Veronica Barron, female    DOB: [birth year]  Age: 77 y.o. 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  HPI Veronica Barron presents for evaluation and treatment of acute left foot pain attributed to gout by patient.  Patient has no known  or documented history of gout.  Foot has been red swollen and painful for the past 7 days.  She has no history of open wound, insect bite, trauma, or unusual activity.  She does recall eating red meat three times during the week prior to the onset of pain, which is unusual for him.   She Has been taking her husband's indomethacin 50 mg , not more than twice daily along with advil 200 mg  Not more than once daily .      Outpatient Medications Prior to Visit  Medication Sig Dispense Refill  . ALFALFA PO Take 1 tablet by mouth daily.    Marland Kitchen aspirin EC 81 MG tablet Take 81 mg by mouth daily.    . carvedilol (COREG) 6.25 MG tablet TAKE 1 TABLET BY MOUTH TWICE DAILY WITH MEALS 180 tablet 0  . clopidogrel (PLAVIX) 75 MG tablet Take 1 tablet (75 mg total) by mouth daily. 90 tablet 3  . clorazepate (TRANXENE-T) 7.5 MG tablet Take 1 tablet (7.5 mg total) by mouth at bedtime as needed for anxiety. 30 tablet 0  . levothyroxine (SYNTHROID, LEVOTHROID) 75 MCG tablet TAKE 1 TABLET BY MOUTH DAILY BEFORE BREAKFAST. LABS NEED PRIOR TO NEXT REFILL 90 tablet 1  . lisinopril (PRINIVIL,ZESTRIL) 5 MG tablet TAKE 1 TABLET BY MOUTH TWICE DAILY 180 tablet 0  . multivitamin-lutein (OCUVITE-LUTEIN) CAPS capsule Take 1 capsule by mouth daily.    . sertraline (ZOLOFT) 50 MG tablet Take 1 tablet (50 mg total) by mouth daily. 90 tablet 1  . vitamin B-12 (CYANOCOBALAMIN) 500 MCG tablet Take 500 mcg by mouth daily.    . vitamin C (ASCORBIC ACID) 500 MG tablet Take 500 mg by mouth daily.     No facility-administered medications prior to visit.     Review of  Systems;  Patient denies headache, fevers, malaise, unintentional weight loss, skin rash, eye pain, sinus congestion and sinus pain, sore throat, dysphagia,  hemoptysis , cough, dyspnea, wheezing, chest pain, palpitations, orthopnea, edema, abdominal pain, nausea, melena, diarrhea, constipation, flank pain, dysuria, hematuria, urinary  Frequency, nocturia, numbness, tingling, seizures,  Focal weakness, Loss of consciousness,  Tremor, insomnia, depression, anxiety, and suicidal ideation.      Objective:  BP (!) 148/86 (BP Location: Left Arm, Patient Position: Sitting, Cuff Size: Normal)   Pulse 70   Temp 97.7 F (36.5 C) (Oral)   Resp 15   Ht 5\' 3"  (1.6 m)   Wt 153 lb 9.6 oz (69.7 kg)   SpO2 96%   BMI 27.21 kg/m   BP Readings from Last 3 Encounters:  02/22/18 (!) 148/86  10/28/17 120/82  07/20/17 (!) 157/76    Wt Readings from Last 3 Encounters:  02/22/18 153 lb 9.6 oz (69.7 kg)  10/28/17 153 lb 6.4 oz (69.6 kg)  07/20/17 151 lb (68.5 kg)    General appearance: alert, cooperative and appears stated age Neck: no adenopathy, no carotid bruit, supple, symmetrical, trachea midline and thyroid not enlarged, symmetric, no tenderness/mass/nodules Back: symmetric, no curvature. ROM normal. No CVA tenderness. Lungs: clear to auscultation bilaterally Heart: regular  rate and rhythm, S1, S2 normal, no murmur, click, rub or gallop Abdomen: soft, non-tender; bowel sounds normal; no masses,  no organomegaly Pulses: 2+ and symmetric Skin: Skin color, texture, turgor normal. No rashes or lesions Ext:  Erythema and warmth to left great toe.  Tender to palpation  Lymph nodes: Cervical, supraclavicular, and axillary nodes normal.  Lab Results  Component Value Date   HGBA1C 6.4 10/28/2017   HGBA1C 6.2 04/27/2017   HGBA1C 5.6 04/25/2016    Lab Results  Component Value Date   CREATININE 0.86 02/22/2018   CREATININE 0.75 10/28/2017   CREATININE 0.83 04/27/2017    Lab Results  Component  Value Date   WBC 8.8 02/22/2018   HGB 13.1 02/22/2018   HCT 38.9 02/22/2018   PLT 379.0 02/22/2018   GLUCOSE 108 (H) 02/22/2018   CHOL 251 (H) 10/28/2017   TRIG 212.0 (H) 10/28/2017   HDL 54.60 10/28/2017   LDLDIRECT 174.0 10/28/2017   LDLCALC 154 (H) 11/09/2014   ALT 19 10/28/2017   AST 22 10/28/2017   NA 135 02/22/2018   K 4.2 02/22/2018   CL 101 02/22/2018   CREATININE 0.86 02/22/2018   BUN 12 02/22/2018   CO2 26 02/22/2018   TSH 1.89 10/28/2017   INR 0.92 05/17/2014   HGBA1C 6.4 10/28/2017    US Breast Ltd Uni Right Inc Axilla  Result Date: 01/13/2018 CLINICAL DATA:  Ultrasound only screening recall for an oval circumscribed mass seen in the upper central right breast measuring approximately 1.2 cm. EXAM: ULTRASOUND OF THE RIGHT BREAST COMPARISON:  Previous exam(s). FINDINGS: Targeted ultrasound of the right breast was performed demonstrating a cluster of cysts at the 12:30 position 2 cm from nipple measuring 1.3 x 0.6 x 1.1 cm. This corresponds well with the mass seen in the right breast at mammography. IMPRESSION: Right breast cyst. There are no findings of malignancy in the right breast. RECOMMENDATION: Recommend annual routine screening mammography, due June 2020. I have discussed the findings and recommendations with the patient. Results were also provided in writing at the conclusion of the visit. If applicable, a reminder letter will be sent to the patient regarding the next appointment. BI-RADS CATEGORY  2: Benign. Electronically Signed   By: Everlean Alstrom M.D.   On: 01/13/2018 14:36    Assessment & Plan:   Problem List Items Addressed This Visit    Gout attack    Suggested by exam of great toe on left foot.  Indomethacin 50 mg tid , tylenol 500 mg every 6 hours   Lab Results  Component Value Date   LABURIC 6.0 02/22/2018   Lab Results  Component Value Date   ESRSEDRATE 14 02/22/2018   Lab Results  Component Value Date   WBC 8.8 02/22/2018   HGB 13.1  02/22/2018   HCT 38.9 02/22/2018   MCV 85.0 02/22/2018   PLT 379.0 02/22/2018   Lab Results  Component Value Date   CREATININE 0.86 02/22/2018          Other Visit Diagnoses    Acute pain of left foot        A total of 40 minutes of face to face time was spent with patient more than half of which was spent in counselling and coordination of care   I am having Tarisa A. Everson start on indomethacin and omeprazole. I am also having her maintain her vitamin C, aspirin EC, multivitamin-lutein, ALFALFA PO, vitamin B-12, clorazepate, clopidogrel, sertraline, levothyroxine, lisinopril, and carvedilol.  Meds  ordered this encounter  Medications  . indomethacin (INDOCIN) 50 MG capsule    Sig: Take 1 capsule (50 mg total) by mouth 3 (three) times daily with meals.    Dispense:  30 capsule    Refill:  2  . omeprazole (PRILOSEC) 20 MG capsule    Sig: Take 1 capsule (20 mg total) by mouth daily.    Dispense:  30 capsule    Refill:  0    There are no discontinued medications.  Follow-up: No follow-ups on file.   [name], MD

## 2018-02-22 NOTE — Telephone Encounter (Signed)
Called pt to get a little more information on the left leg pain she is having. The pt stated that she is not having leg pain, it is left foot pain. The pt stated that she feels like it is a gout flare. She stated that she is having redness, swelling in the foot only, pain, and pain when walking. Pt stated that there is no swelling or redness traveling up her leg.

## 2018-02-22 NOTE — Patient Instructions (Addendum)
indomethacin 50 g every 8 hours for up to 10 days  You can add up to 2000 mg of acetominophen (tylenol) every day safely  In divided doses (500 mg every 6 hours  Or 1000 mg every 12 hours.)  Consider adding  Prilosec 20 mg  daily  For 10 days to protect your stomach while taking the indomethacin  (rx sent )    Gout Gout is painful swelling that can happen in some of your joints. Gout is a type of arthritis. This condition is caused by having too much uric acid in your body. Uric acid is a chemical that is made when your body breaks down substances called purines. If your body has too much uric acid, sharp crystals can form and build up in your joints. This causes pain and swelling. Gout attacks can happen quickly and be very painful (acute gout). Over time, the attacks can affect more joints and happen more often (chronic gout). Follow these instructions at home: During a Gout Attack  If directed, put ice on the painful area: ? Put ice in a plastic bag. ? Place a towel between your skin and the bag. ? Leave the ice on for 20 minutes, 2-3 times a day.  Rest the joint as much as possible. If the joint is in your leg, you may be given crutches to use.  Raise (elevate) the painful joint above the level of your heart as often as you can.  Drink enough fluids to keep your pee (urine) clear or pale yellow.  Take over-the-counter and prescription medicines only as told by your doctor.  Do not drive or use heavy machinery while taking prescription pain medicine.  Follow instructions from your doctor about what you can or cannot eat and drink.  Return to your normal activities as told by your doctor. Ask your doctor what activities are safe for you. Avoiding Future Gout Attacks  Follow a low-purine diet as told by a specialist (dietitian) or your doctor. Avoid foods and drinks that have a lot of purines, such  as: ? Liver. ? Kidney. ? Anchovies. ? Asparagus. ? Herring. ? Mushrooms ? Mussels. ? Beer.  Limit alcohol intake to no more than 1 drink a day for nonpregnant women and 2 drinks a day for men. One drink equals 12 oz of beer, 5 oz of wine, or 1 oz of hard liquor.  Stay at a healthy weight or lose weight if you are overweight. If you want to lose weight, talk with your doctor. It is important that you do not lose weight too fast.  Start or continue an exercise plan as told by your doctor.  Drink enough fluids to keep your pee clear or pale yellow.  Take over-the-counter and prescription medicines only as told by your doctor.  Keep all follow-up visits as told by your doctor. This is important. Contact a doctor if:  You have another gout attack.  You still have symptoms of a gout attack after10 days of treatment.  You have problems (side effects) because of your medicines.  You have chills or a fever.  You have burning pain when you pee (urinate).  You have pain in your lower back or belly. Get help right away if:  You have very bad pain.  Your pain cannot be controlled.  You cannot pee. This information is not intended to replace advice given to you by your health care provider. Make sure you discuss any questions you have with your health  care provider. Document Released: 04/08/2008 Document Revised: 12/06/2015 Document Reviewed: 04/12/2015 Elsevier Interactive Patient Education  [employer].

## 2018-02-23 DIAGNOSIS — M109 Gout, unspecified: Secondary | ICD-10-CM | POA: Insufficient documentation

## 2018-02-23 LAB — BASIC METABOLIC PANEL
BUN: 12 mg/dL (ref 6–23)
CO2: 26 meq/L (ref 19–32)
Calcium: 9.3 mg/dL (ref 8.4–10.5)
Chloride: 101 mEq/L (ref 96–112)
Creatinine, Ser: 0.86 mg/dL (ref 0.40–1.20)
GFR: 67.88 mL/min (ref >60.00)
GLUCOSE: 108 mg/dL — AB (ref 70–99)
POTASSIUM: 4.2 meq/L (ref 3.5–5.1)
SODIUM: 135 meq/L (ref 135–145)

## 2018-02-23 LAB — CBC WITH DIFFERENTIAL/PLATELET
Basophils Absolute: 0.1 10*3/uL (ref 0.0–0.1)
Basophils Relative: 1.5 % (ref 0.0–3.0)
Eosinophils Absolute: 0.2 10*3/uL (ref 0.0–0.7)
Eosinophils Relative: 2.7 % (ref 0.0–5.0)
HCT: 38.9 % (ref 36.0–46.0)
Hemoglobin: 13.1 g/dL (ref 12.0–15.0)
Lymphocytes Relative: 28 % (ref 12.0–46.0)
Lymphs Abs: 2.5 10*3/uL (ref 0.7–4.0)
MCHC: 33.7 g/dL (ref 30.0–36.0)
MCV: 85 fl (ref 78.0–100.0)
Monocytes Absolute: 0.5 10*3/uL (ref 0.1–1.0)
Monocytes Relative: 5.5 % (ref 3.0–12.0)
Neutro Abs: 5.5 10*3/uL (ref 1.4–7.7)
Neutrophils Relative %: 62.3 % (ref 43.0–77.0)
Platelets: 379 10*3/uL (ref 150.0–400.0)
RBC: 4.58 Mil/uL (ref 3.87–5.11)
RDW: 13.6 % (ref 11.5–15.5)
WBC: 8.8 10*3/uL (ref 4.0–10.5)

## 2018-02-23 LAB — SEDIMENTATION RATE: Sed Rate: 14 mm/hr (ref 0–30)

## 2018-02-23 LAB — URIC ACID: Uric Acid, Serum: 6 mg/dL (ref 2.4–7.0)

## 2018-02-23 NOTE — Assessment & Plan Note (Signed)
Suggested by exam of great toe on left foot.  Indomethacin 50 mg tid , tylenol 500 mg every 6 hours   Lab Results  Component Value Date   LABURIC 6.0 02/22/2018   Lab Results  Component Value Date   ESRSEDRATE 14 02/22/2018   Lab Results  Component Value Date   WBC 8.8 02/22/2018   HGB 13.1 02/22/2018   HCT 38.9 02/22/2018   MCV 85.0 02/22/2018   PLT 379.0 02/22/2018   Lab Results  Component Value Date   CREATININE 0.86 02/22/2018

## 2018-03-09 ENCOUNTER — Encounter: Payer: Self-pay | Admitting: Cardiology

## 2018-03-16 ENCOUNTER — Other Ambulatory Visit: Payer: Self-pay | Admitting: Cardiology

## 2018-03-18 ENCOUNTER — Other Ambulatory Visit: Payer: Self-pay | Admitting: Cardiology

## 2018-03-25 ENCOUNTER — Ambulatory Visit: Payer: Medicare Other | Admitting: Cardiology

## 2018-03-27 NOTE — Progress Notes (Signed)
Veronica Barron Date of Birth: Oct 25, 1940 Medical Record #[id number]  History of Present Illness: Veronica Barron is seen today for followup CAD. She has a history of hypertension, hyperlipidemia, and hypothyroidism. In Sept 2014 she had stenting of the proximal LAD with DES.  The distal left circumflex was occluded with left to left collaterals and right-to-left collaterals. The right coronary was without significant disease. Ejection fraction was 30%.  Her other disease was treated medically. On followup Echo in Dec. 2014 EF had improved to 50-55%. In November 2015 she had a significant episode of chest pain. Repeat cardiac cath showed occlusion of the distal LCx and the stent in the LAD was patent.  She has a history of intolerance to multiple statins, Zetia and Lopid, and fenofibrates. She was previously unable afford a PCSK 9 inhibitor but this was back in 2016.   On follow up today she reports she is feeling very well. No chest pain or SOB. Walks daily and does yard work.    Current Outpatient Medications on File Prior to Visit  Medication Sig Dispense Refill  . ALFALFA PO Take 1 tablet by mouth daily.    Marland Kitchen aspirin EC 81 MG tablet Take 81 mg by mouth daily.    . carvedilol (COREG) 6.25 MG tablet TAKE 1 TABLET BY MOUTH TWICE DAILY WITH MEALS 180 tablet 3  . clopidogrel (PLAVIX) 75 MG tablet Take 1 tablet (75 mg total) by mouth daily. 90 tablet 3  . clorazepate (TRANXENE-T) 7.5 MG tablet Take 1 tablet (7.5 mg total) by mouth at bedtime as needed for anxiety. 30 tablet 0  . levothyroxine (SYNTHROID, LEVOTHROID) 75 MCG tablet TAKE 1 TABLET BY MOUTH DAILY BEFORE BREAKFAST. LABS NEED PRIOR TO NEXT REFILL 90 tablet 1  . lisinopril (PRINIVIL,ZESTRIL) 5 MG tablet TAKE 1 TABLET BY MOUTH TWICE DAILY 180 tablet 1  . multivitamin-lutein (OCUVITE-LUTEIN) CAPS capsule Take 1 capsule by mouth daily.    Marland Kitchen omeprazole (PRILOSEC) 20 MG capsule Take 1 capsule (20 mg total) by mouth daily. 30 capsule 0  .  sertraline (ZOLOFT) 50 MG tablet Take 1 tablet (50 mg total) by mouth daily. 90 tablet 1  . vitamin B-12 (CYANOCOBALAMIN) 500 MCG tablet Take 500 mcg by mouth daily.    . vitamin C (ASCORBIC ACID) 500 MG tablet Take 500 mg by mouth daily.     No current facility-administered medications on file prior to visit.     Allergies  Allergen Reactions  . Diphenhydramine Hcl     REACTION: TACHYCARDIA  . Fenofibrate Other (See Comments)    constipation  . Statins     Myalgia   . Zetia [Ezetimibe] Other (See Comments)    Myalgias     Past Medical History:  Diagnosis Date  . Allergic rhinitis, cause unspecified   . CAD (coronary artery disease)    a. 03/2013 Abnl MV, EF 29%;  b. 03/2013 Cath/PCI: >M nl, LAD 99p (2.5x28 Promus Prem DES), LCX 100 after large OM1, RCA dominant, 20p, EF 30%.  . Diverticulosis of colon   . Hyperlipidemia   . Hypertension   . Ischemic cardiomyopathy    a. 03/2013 EF 30% by LV gram.;  b. Echo (12/14): Mod LVH with severe basal septal hypertrophy w/o LVOT obstr, EF 50-55%, normal wall motion, Gr 1 DD, MAC, mild MR, mild LAE, trivial eff.  . Leiomyoma of uterus, unspecified   . Unspecified hypothyroidism     Past Surgical History:  Procedure Laterality Date  . ABDOMINAL HYSTERECTOMY  fibroid   at  age 31  . CHOLECYSTECTOMY    . LEFT HEART CATHETERIZATION WITH CORONARY ANGIOGRAM N/A 03/16/2013   Procedure: LEFT HEART CATHETERIZATION WITH CORONARY ANGIOGRAM;  Surgeon: Tivon Lemoine M [name], MD;  Location: [facility] CATH LAB;  Service: Cardiovascular;  Laterality: N/A;  . LEFT HEART CATHETERIZATION WITH CORONARY ANGIOGRAM N/A 05/23/2014   Procedure: LEFT HEART CATHETERIZATION WITH CORONARY ANGIOGRAM;  Surgeon: Orien Mayhall M [name], MD;  Location: [facility] CATH LAB;  Service: Cardiovascular;  Laterality: N/A;  . PERCUTANEOUS CORONARY STENT INTERVENTION (PCI-S)  03/16/2013   Procedure: PERCUTANEOUS CORONARY STENT INTERVENTION (PCI-S);  Surgeon: Necole Minassian M [name], MD;  Location: [facility] CATH LAB;   Service: Cardiovascular;;  . TUBAL LIGATION      Social History   Tobacco Use  Smoking Status Never Smoker  Smokeless Tobacco Never Used    Social History   Substance and Sexual Activity  Alcohol Use Yes   Comment: wine    Family History  Problem Relation Age of Onset  . Dementia Mother   . Hyperlipidemia Mother   . Hypertension Mother   . Coronary artery disease Father   . Hypertension Father   . Hyperlipidemia Father   . Uterine cancer Sister 42  . Breast cancer Maternal Aunt     Review of Systems: As noted in history of present illness.  All other systems were reviewed and are negative.  Physical Exam: BP (!) 142/80   Pulse 66   Ht _0  (1.6 m)   Wt 152 lb 6.4 oz (69.1 kg)   BMI 27.00 kg/m  GENERAL:  Well appearing WF in NAD HEENT:  PERRL, EOMI, sclera are clear. Oropharynx is clear. NECK:  No jugular venous distention, carotid upstroke brisk and symmetric, no bruits, no thyromegaly or adenopathy LUNGS:  Clear to auscultation bilaterally CHEST:  Unremarkable HEART:  RRR,  PMI not displaced or sustained,S1 and S2 within normal limits, no S3, no S4: no clicks, no rubs, no murmurs ABD:  Soft, nontender. BS +, no masses or bruits. No hepatomegaly, no splenomegaly EXT:  2 + pulses throughout, no edema, no cyanosis no clubbing SKIN:  Warm and dry.  No rashes NEURO:  Alert and oriented x 3. Cranial nerves II through XII intact. PSYCH:  Cognitively intact  LABORATORY DATA: Lab Results  Component Value Date   WBC 8.8 02/22/2018   HGB 13.1 02/22/2018   HCT 38.9 02/22/2018   PLT 379.0 02/22/2018   GLUCOSE 108 (H) 02/22/2018   CHOL 251 (H) 10/28/2017   TRIG 212.0 (H) 10/28/2017   HDL 54.60 10/28/2017   LDLDIRECT 174.0 10/28/2017   LDLCALC 154 (H) 11/09/2014   ALT 19 10/28/2017   AST 22 10/28/2017   NA 135 02/22/2018   K 4.2 02/22/2018   CL 101 02/22/2018   CREATININE 0.86 02/22/2018   BUN 12 02/22/2018   CO2 26 02/22/2018   TSH 1.89 10/28/2017   INR  0.92 05/17/2014   HGBA1C 6.4 10/28/2017   Ecg today shows NSR with old anterior infarct. T wave inversion in 1 and avl. I have personally reviewed and interpreted this study. No change.  Assessment / Plan: 1. Coronary disease status post stenting of the proximal LAD in September 2014 with drug-eluting stent. 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 Cardiac cath in November 2015 showed continued stent patency. She is asymptomatic.  Continue current therapy.   2. Hyperlipidemia. History of intolerance to multiple statins, Zetia, fenofibrates, and Lopid. Continue  dietary  modifications. Will check with Pharm D to relook at the possibility of a PCSK 9 inhibitor.   3. Hypertension- good control. Continue Rx.  4. Hypothyroidism. TSH normal.

## 2018-03-30 ENCOUNTER — Encounter: Payer: Self-pay | Admitting: Cardiology

## 2018-03-30 ENCOUNTER — Ambulatory Visit: Payer: Medicare Other | Admitting: Cardiology

## 2018-03-30 VITALS — BP 142/80 | HR 66 | Ht 63.0 in | Wt 152.4 lb

## 2018-03-30 DIAGNOSIS — I2510 Atherosclerotic heart disease of native coronary artery without angina pectoris: Secondary | ICD-10-CM

## 2018-03-30 DIAGNOSIS — E7800 Pure hypercholesterolemia, unspecified: Secondary | ICD-10-CM | POA: Diagnosis not present

## 2018-03-30 DIAGNOSIS — I10 Essential (primary) hypertension: Secondary | ICD-10-CM | POA: Diagnosis not present

## 2018-03-30 NOTE — Patient Instructions (Addendum)
We will look into one of the newer cholesterol drugs.   Stay active.  I will see you in 6 months.

## 2018-04-13 ENCOUNTER — Other Ambulatory Visit: Payer: Self-pay | Admitting: Pharmacist Clinician (PhC)/ Clinical Pharmacy Specialist

## 2018-04-13 ENCOUNTER — Ambulatory Visit: Payer: Medicare Other | Admitting: Pharmacist Clinician (PhC)/ Clinical Pharmacy Specialist

## 2018-04-13 DIAGNOSIS — E7800 Pure hypercholesterolemia, unspecified: Secondary | ICD-10-CM

## 2018-04-13 NOTE — Assessment & Plan Note (Signed)
Patient with mixed hyperlipidemia, intolerant to multiple medication options.  She will go to the lab in the next week, as we will need lipid labs within the last 90 days to submit to insurance.  Once those results are in, we can start the process to get approval.  If her insurance has a brand name deductible, we may be better off starting on Jan 1, to avoid paying that deductible this month, then repeating in January.  If the medication is cost prohibitive in general we can reach out to [employer].

## 2018-04-13 NOTE — Progress Notes (Signed)
04/13/2018 Veronica Barron 11-21-1940 [id number]     HPI:  Veronica Barron is a 77 y.o. female patient of Dr [name], who presents today for a lipid clinic evaluation.  In addition to hyperlipidemia, her medical history is significant for CAD (s/p DES to LAD) hypertension, ischemic cardiomyopathy, and hypothyroidism.  She has tried various medications for her cholesterol over the years, but unfortunately has not been able to take for any significant amount of time due to side effects.    She had previously discussed the option of PCSK-9 inhibitors with her PCP, however they were cost prohibitive and she was never able to get a prescription filled.  It is her hope that now with a lower price, her insurance will make the medication cost within reach.    Current Medications:  None  Cholesterol Goals:   LDL < 70 (or 50%)  Intolerant/previously tried:  Statins cause myalgias in the legs - simva, rosuva, prava  Fish oil - small amts okay, more causes breakout of face  Ezetimibe - myalgias  fibrates - constipation  Family history:   Father died at 38 MI  Mother with high cholesterol, lived to be 51  Brother with stent (1/3)  Diet:   Eats out "too much", more than once weekly; eats lot of chicken, some beef/pork; loves salads; plenty of fruits  Exercise:    Walk daily, works in yard/garden;  Labs:   10/28/17:  TC 251, TG 212, GHL 54.6, LDL (direct) 174 - no medications   Current Outpatient Medications  Medication Sig Dispense Refill  . ALFALFA PO Take 1 tablet by mouth daily.    Marland Kitchen aspirin EC 81 MG tablet Take 81 mg by mouth daily.    . carvedilol (COREG) 6.25 MG tablet TAKE 1 TABLET BY MOUTH TWICE DAILY WITH MEALS 180 tablet 3  . clopidogrel (PLAVIX) 75 MG tablet Take 1 tablet (75 mg total) by mouth daily. 90 tablet 3  . clorazepate (TRANXENE-T) 7.5 MG tablet Take 1 tablet (7.5 mg total) by mouth at bedtime as needed for anxiety. 30 tablet 0  . levothyroxine (SYNTHROID, LEVOTHROID) 75 MCG  tablet TAKE 1 TABLET BY MOUTH DAILY BEFORE BREAKFAST. LABS NEED PRIOR TO NEXT REFILL 90 tablet 1  . lisinopril (PRINIVIL,ZESTRIL) 5 MG tablet TAKE 1 TABLET BY MOUTH TWICE DAILY 180 tablet 1  . multivitamin-lutein (OCUVITE-LUTEIN) CAPS capsule Take 1 capsule by mouth daily.    Marland Kitchen omeprazole (PRILOSEC) 20 MG capsule Take 1 capsule (20 mg total) by mouth daily. 30 capsule 0  . sertraline (ZOLOFT) 50 MG tablet Take 1 tablet (50 mg total) by mouth daily. 90 tablet 1  . vitamin B-12 (CYANOCOBALAMIN) 500 MCG tablet Take 500 mcg by mouth daily.    . vitamin C (ASCORBIC ACID) 500 MG tablet Take 500 mg by mouth daily.     No current facility-administered medications for this visit.     Allergies  Allergen Reactions  . Diphenhydramine Hcl     REACTION: TACHYCARDIA  . Fenofibrate Other (See Comments)    constipation  . Statins     Myalgia   . Zetia [Ezetimibe] Other (See Comments)    Myalgias     Past Medical History:  Diagnosis Date  . Allergic rhinitis, cause unspecified   . CAD (coronary artery disease)    a. 03/2013 Abnl MV, EF 29%;  b. 03/2013 Cath/PCI: >M nl, LAD 99p (2.5x28 Promus Prem DES), LCX 100 after large OM1, RCA dominant, 20p, EF 30%.  . Diverticulosis  of colon   . Hyperlipidemia   . Hypertension   . Ischemic cardiomyopathy    a. 03/2013 EF 30% by LV gram.;  b. Echo (12/14): Mod LVH with severe basal septal hypertrophy w/o LVOT obstr, EF 50-55%, normal wall motion, Gr 1 DD, MAC, mild MR, mild LAE, trivial eff.  . Leiomyoma of uterus, unspecified   . Unspecified hypothyroidism     There were no vitals taken for this visit.   HYPERLIPIDEMIA Patient with mixed hyperlipidemia, intolerant to multiple medication options.  She will go to the lab in the next week, as we will need lipid labs within the last 90 days to submit to insurance.  Once those results are in, we can start the process to get approval.  If her insurance has a brand name deductible, we may be better off starting  on Jan 1, to avoid paying that deductible this month, then repeating in January.  If the medication is cost prohibitive in general we can reach out to [employer].     Tommy Medal PharmD CPP [facility] [address] [place]  [place], [place] [address]

## 2018-04-13 NOTE — Patient Instructions (Signed)
Get your cholesterol checked sometime in the next week or two (fasting)  Once I get those results we will submit information to your insurance company about getting approval  Evolocumab injection What is this medicine? EVOLOCUMAB (e voe LOK ue mab) is known as a PCSK9 inhibitor. It is used to lower the level of cholesterol in the blood. It may be used alone or in combination with other cholesterol-lowering drugs. This drug may also be used to reduce the risk of heart attack, stroke, and certain types of heart surgery in patients with heart disease. This medicine may be used for other purposes; ask your health care provider or pharmacist if you have questions. COMMON BRAND NAME(S): REPATHA What should I tell my health care provider before I take this medicine? They need to know if you have any of these conditions: -an unusual or allergic reaction to evolocumab, other medicines, foods, dyes, or preservatives -pregnant or trying to get pregnant -breast-feeding How should I use this medicine? This medicine is for injection under the skin. You will be taught how to prepare and give this medicine. Use exactly as directed. Take your medicine at regular intervals. Do not take your medicine more often than directed. It is important that you put your used needles and syringes in a special sharps container. Do not put them in a trash can. If you do not have a sharps container, call your pharmacist or health care provider to get one. Talk to your pediatrician regarding the use of this medicine in children. While this drug may be prescribed for children as young as 13 years for selected conditions, precautions do apply. Overdosage: If you think you have taken too much of this medicine contact a poison control center or emergency room at once. NOTE: This medicine is only for you. Do not share this medicine with others. What if I miss a dose? If you miss a dose, take it as soon as you can if there are more  than 7 days until the next scheduled dose, or skip the missed dose and take the next dose according to your original schedule. Do not take double or extra doses. What may interact with this medicine? Interactions are not expected. This list may not describe all possible interactions. Give your health care provider a list of all the medicines, herbs, non-prescription drugs, or dietary supplements you use. Also tell them if you smoke, drink alcohol, or use illegal drugs. Some items may interact with your medicine. What should I watch for while using this medicine? You may need blood work while you are taking this medicine. What side effects may I notice from receiving this medicine? Side effects that you should report to your doctor or health care professional as soon as possible: -allergic reactions like skin rash, itching or hives, swelling of the face, lips, or tongue -signs and symptoms of infection like fever or chills; cough; sore throat; pain or trouble passing urine Side effects that usually do not require medical attention (report to your doctor or health care professional if they continue or are bothersome): -diarrhea -nausea -muscle pain -pain, redness, or irritation at site where injected This list may not describe all possible side effects. Call your doctor for medical advice about side effects. You may report side effects to FDA at 1-800-FDA-1088. Where should I keep my medicine? Keep out of the reach of children. You will be instructed on how to store this medicine. Throw away any unused medicine after the expiration date on  the label. NOTE: This sheet is a summary. It may not cover all possible information. If you have questions about this medicine, talk to your doctor, pharmacist, or health care provider.  2018 Elsevier/Gold Standard (2016-06-16 13:21:53)

## 2018-04-22 ENCOUNTER — Telehealth: Payer: Self-pay | Admitting: Radiology

## 2018-04-22 DIAGNOSIS — E7800 Pure hypercholesterolemia, unspecified: Secondary | ICD-10-CM

## 2018-04-22 DIAGNOSIS — I10 Essential (primary) hypertension: Secondary | ICD-10-CM

## 2018-04-22 DIAGNOSIS — E034 Atrophy of thyroid (acquired): Secondary | ICD-10-CM

## 2018-04-22 DIAGNOSIS — R7301 Impaired fasting glucose: Secondary | ICD-10-CM

## 2018-04-22 NOTE — Addendum Note (Signed)
Addended by: [name] on: 04/22/2018 10:03 AM   Modules accepted: Orders

## 2018-04-22 NOTE — Telephone Encounter (Signed)
Pt coming in for labs Monday, please place future orders. Thank you 

## 2018-04-22 NOTE — Telephone Encounter (Signed)
Labs ordered   includinf fasting lipids.

## 2018-04-26 ENCOUNTER — Other Ambulatory Visit (INDEPENDENT_AMBULATORY_CARE_PROVIDER_SITE_OTHER): Payer: Medicare Other

## 2018-04-26 DIAGNOSIS — E034 Atrophy of thyroid (acquired): Secondary | ICD-10-CM

## 2018-04-26 DIAGNOSIS — E7800 Pure hypercholesterolemia, unspecified: Secondary | ICD-10-CM | POA: Diagnosis not present

## 2018-04-26 DIAGNOSIS — R7301 Impaired fasting glucose: Secondary | ICD-10-CM | POA: Diagnosis not present

## 2018-04-26 DIAGNOSIS — I10 Essential (primary) hypertension: Secondary | ICD-10-CM

## 2018-04-26 LAB — COMPREHENSIVE METABOLIC PANEL
ALBUMIN: 4.2 g/dL (ref 3.5–5.2)
ALT: 21 U/L (ref 0–35)
AST: 20 U/L (ref 0–37)
Alkaline Phosphatase: 50 U/L (ref 39–117)
BUN: 14 mg/dL (ref 6–23)
CHLORIDE: 104 meq/L (ref 96–112)
CO2: 29 mEq/L (ref 19–32)
CREATININE: 0.8 mg/dL (ref 0.40–1.20)
Calcium: 9.4 mg/dL (ref 8.4–10.5)
GFR: 73.76 mL/min (ref >60.00)
Glucose, Bld: 119 mg/dL — ABNORMAL HIGH (ref 70–99)
Potassium: 4.2 mEq/L (ref 3.5–5.1)
SODIUM: 140 meq/L (ref 135–145)
TOTAL PROTEIN: 7 g/dL (ref 6.0–8.3)
Total Bilirubin: 0.4 mg/dL (ref 0.2–1.2)

## 2018-04-26 LAB — LIPID PANEL
CHOL/HDL RATIO: 5
CHOLESTEROL: 264 mg/dL — AB (ref 0–200)
HDL: 51.1 mg/dL (ref >39.00)
NonHDL: 212.81
Triglycerides: 258 mg/dL — ABNORMAL HIGH (ref 0.0–149.0)
VLDL: 51.6 mg/dL — ABNORMAL HIGH (ref 0.0–40.0)

## 2018-04-26 LAB — TSH: TSH: 4.53 u[IU]/mL — AB (ref 0.35–4.50)

## 2018-04-26 LAB — HEMOGLOBIN A1C: Hgb A1c MFr Bld: 6.2 % (ref 4.6–6.5)

## 2018-04-26 LAB — LDL CHOLESTEROL, DIRECT: Direct LDL: 193 mg/dL

## 2018-04-30 ENCOUNTER — Other Ambulatory Visit: Payer: Self-pay | Admitting: Internal Medicine

## 2018-05-01 ENCOUNTER — Other Ambulatory Visit: Payer: Self-pay | Admitting: Internal Medicine

## 2018-05-21 ENCOUNTER — Other Ambulatory Visit: Payer: Self-pay | Admitting: Pharmacist Clinician (PhC)/ Clinical Pharmacy Specialist

## 2018-05-21 MED ORDER — EVOLOCUMAB 140 MG/ML ~~LOC~~ SOAJ: 140.0000 mg | SUBCUTANEOUS | 12 refills | Status: DC

## 2018-05-26 ENCOUNTER — Ambulatory Visit (INDEPENDENT_AMBULATORY_CARE_PROVIDER_SITE_OTHER): Payer: Medicare Other

## 2018-05-26 VITALS — BP 148/82 | HR 60 | Temp 97.7°F | Resp 15 | Ht 63.0 in | Wt 155.0 lb

## 2018-05-26 DIAGNOSIS — Z0000 Encounter for general adult medical examination without abnormal findings: Secondary | ICD-10-CM

## 2018-05-26 DIAGNOSIS — Z23 Encounter for immunization: Secondary | ICD-10-CM | POA: Diagnosis not present

## 2018-05-26 NOTE — Patient Instructions (Addendum)
  Ms. Petsch , Thank you for taking time to come for your Medicare Wellness Visit. I appreciate your ongoing commitment to your health goals. Please review the following plan we discussed and let me know if I can assist you in the future.   Follow up as needed.    Bring a copy of your [place] and/or Living Will to be scanned into chart.  Have a great day!  These are the goals we discussed: Goals    . Maintain Healthy Lifestyle     Healthy diet Exercise        This is a list of the screening recommended for you and due dates:  Health Maintenance  Topic Date Due  . Mammogram  01/09/2020  . Tetanus Vaccine  03/10/2023  . Flu Shot  Completed  . DEXA scan (bone density measurement)  Completed  . Pneumonia vaccines  Completed

## 2018-05-26 NOTE — Progress Notes (Addendum)
Subjective:   Veronica Barron is a 77 y.o. female who presents for Medicare Annual (Subsequent) preventive examination.  Review of Systems:  No ROS.  Medicare Wellness Visit. Additional risk factors are reflected in the social history. Cardiac Risk Factors include: advanced age (>27mn, >>88women);hypertension     Objective:     Vitals: BP (!) 148/82 (BP Location: Left Arm, Patient Position: Sitting, Cuff Size: Normal)   Pulse 60   Temp 97.7 F (36.5 C) (Oral)   Resp 15   Ht 5' 3"  (1.6 m)   Wt 155 lb (70.3 kg)   SpO2 96%   BMI 27.46 kg/m   Body mass index is 27.46 kg/m.  Advanced Directives 05/26/2018 05/25/2017 05/19/2016 12/01/2014 05/23/2014 03/15/2013  Does Patient Have a Medical Advance Directive? Yes Yes Yes Yes Yes Patient has advance directive, copy not in chart  Type of Advance Directive [place] will [place] will [place] will - [place] will Living will  Does patient want to make changes to medical advance directive? No - Patient declined No - Patient declined - - No - Patient declined -  Copy of [place]? Yes - validated most recent copy scanned in chart (See row information) Yes No - copy requested - Yes Copy requested from family    Tobacco Social History   Tobacco Use  Smoking Status Never Smoker  Smokeless Tobacco Never Used     Counseling given: Not Answered   Clinical Intake:  Pre-visit preparation completed: Yes  Pain : No/denies pain     Nutritional Status: BMI 25 -29 Overweight Diabetes: No  How often do you need to have someone help you when you read instructions, pamphlets, or other written materials from your doctor or pharmacy?: 1 - Never  Interpreter Needed?: No     Past Medical History:  Diagnosis Date  . Allergic rhinitis, cause unspecified   . CAD (coronary artery disease)    a. 03/2013 Abnl  MV, EF 29%;  b. 03/2013 Cath/PCI: >M nl, LAD 99p (2.5x28 Promus Prem DES), LCX 100 after large OM1, RCA dominant, 20p, EF 30%.  . Diverticulosis of colon   . Hyperlipidemia   . Hypertension   . Ischemic cardiomyopathy    a. 03/2013 EF 30% by LV gram.;  b. Echo (12/14): Mod LVH with severe basal septal hypertrophy w/o LVOT obstr, EF 50-55%, normal wall motion, Gr 1 DD, MAC, mild MR, mild LAE, trivial eff.  . Leiomyoma of uterus, unspecified   . Unspecified hypothyroidism    Past Surgical History:  Procedure Laterality Date  . ABDOMINAL HYSTERECTOMY     fibroid   at  age [age] . CHOLECYSTECTOMY    . LEFT HEART CATHETERIZATION WITH CORONARY ANGIOGRAM N/A 03/16/2013   Procedure: LEFT HEART CATHETERIZATION WITH CORONARY ANGIOGRAM;  Surgeon: [name] JMartinique MD;  [facility];  Service: Cardiovascular;  Laterality: N/A;  . LEFT HEART CATHETERIZATION WITH CORONARY ANGIOGRAM N/A 05/23/2014   Procedure: LEFT HEART CATHETERIZATION WITH CORONARY ANGIOGRAM;  Surgeon: [name] JMartinique MD;  [facility];  Service: Cardiovascular;  Laterality: N/A;  . PERCUTANEOUS CORONARY STENT INTERVENTION (PCI-S)  03/16/2013   Procedure: PERCUTANEOUS CORONARY STENT INTERVENTION (PCI-S);  Surgeon: [name] JMartinique MD;  [facility];  Service: Cardiovascular;;  . TUBAL LIGATION     Family History  Problem Relation Age of Onset  . Dementia Mother   .  Hyperlipidemia Mother   . Hypertension Mother   . Coronary artery disease Father   . Hypertension Father   . Hyperlipidemia Father   . Uterine cancer Sister 37  . Breast cancer Maternal Aunt    Social History   Socioeconomic History  . Marital status: Married    Spouse name: Not on file  . Number of children: 3  . Years of education: Not on file  . Highest education level: Not on file  Occupational History    Employer: RETIRED  Social Needs  . Financial resource strain: Not hard at all  . Food insecurity:    Worry: Never true    Inability:  Never true  . Transportation needs:    Medical: No    Non-medical: No  Tobacco Use  . Smoking status: Never Smoker  . Smokeless tobacco: Never Used  Substance and Sexual Activity  . Alcohol use: Yes    Comment: wine  . Drug use: No  . Sexual activity: Not Currently    Birth control/protection: Post-menopausal  Lifestyle  . Physical activity:    Days per week: 7 days    Minutes per session: 30 min  . Stress: Not at all  Relationships  . Social connections:    Talks on phone: Not on file    Gets together: Not on file    Attends religious service: Not on file    Active member of club or organization: Not on file    Attends meetings of clubs or organizations: Not on file    Relationship status: Married  Other Topics Concern  . 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    Outpatient Encounter Medications as of 05/26/2018  Medication Sig  . ALFALFA PO Take 1 tablet by mouth daily.  Marland Kitchen aspirin EC 81 MG tablet Take 81 mg by mouth daily.  . carvedilol (COREG) 6.25 MG tablet TAKE 1 TABLET BY MOUTH TWICE DAILY WITH MEALS  . clopidogrel (PLAVIX) 75 MG tablet Take 1 tablet (75 mg total) by mouth daily.  . clorazepate (TRANXENE-T) 7.5 MG tablet Take 1 tablet (7.5 mg total) by mouth at bedtime as needed for anxiety.  . Evolocumab (REPATHA SURECLICK) 621 MG/ML SOAJ Inject 140 mg into the skin every 14 (fourteen) days.  Marland Kitchen levothyroxine (SYNTHROID, LEVOTHROID) 75 MCG tablet TAKE 1 TABLET BY MOUTH DAILY BEFORE BREAKFAST. LABS NEEDED PRIOR TO NEXT REFILL  . lisinopril (PRINIVIL,ZESTRIL) 5 MG tablet TAKE 1 TABLET BY MOUTH TWICE DAILY  . multivitamin-lutein (OCUVITE-LUTEIN) CAPS capsule Take 1 capsule by mouth daily.  Marland Kitchen omeprazole (PRILOSEC) 20 MG capsule Take 1 capsule (20 mg total) by mouth daily.  . sertraline (ZOLOFT) 50 MG tablet TAKE 1 TABLET BY MOUTH DAILY  .  vitamin B-12 (CYANOCOBALAMIN) 500 MCG tablet Take 500 mcg by mouth daily.  . vitamin C (ASCORBIC ACID) 500 MG tablet Take 500 mg by mouth daily.   No facility-administered encounter medications on file as of 05/26/2018.     Activities of Daily Living In your present state of health, do you have any difficulty performing the following activities: 05/26/2018  Hearing? N  Vision? N  Difficulty concentrating or making decisions? N  Walking or climbing stairs? N  Dressing or bathing? N  Doing errands, shopping? N  Preparing Food and eating ? N  Using the Toilet? N  In the past six months,  have you accidently leaked urine? N  Do you have problems with loss of bowel control? N  Managing your Medications? N  Managing your Finances? N  Housekeeping or managing your Housekeeping? N  Some recent data might be hidden    Patient Care Team: [name], MD as PCP - General (Internal Medicine) [name], [name], MD as PCP - Cardiology (Cardiology) Judeth Horn, MD (General Surgery) Clent Jacks, MD (Ophthalmology)    Assessment:   This is a routine wellness examination for Dianna.  The goal of the wellness visit is to assist the patient how to close the gaps in care and create a preventative care plan for the patient.   The roster of all physicians providing medical care to patient is listed in the Snapshot section of the chart.  Taking calcium VIT D as appropriate/Osteoporosis risk reviewed.    Safety issues reviewed; Smoke and carbon monoxide detectors in the home. No firearms in the home. Wears seatbelts when driving or riding with others. No violence in the home.  They do not have excessive sun exposure.  Discussed the need for sun protection: hats, long sleeves and the use of sunscreen if there is significant sun exposure.  Patient is alert, normal appearance, oriented to person/place/and time.  Correctly identified the president of the [place] and recalls of 3/3 words. Performs  simple calculations and can read correct time from watch face.  Displays appropriate judgement.  No new identified risk were noted.  No failures at ADL's or IADL's.    BMI- discussed the importance of a healthy diet, water intake and the benefits of aerobic exercise. Educational material provided.   24 hour diet recall: Regular diet  Dental- every 6 months.  Sleep patterns- Sleeps 7-8 hours at night.  Wakes feeling rested.   High dose influenza vaccine administered left deltoid, tolerated well. No verbal complaint during or post administration. Educational material provided.  Health maintenance gaps- closed.  Patient Concerns: None at this time. Follow up with PCP as needed.  Exercise Activities and Dietary recommendations Current Exercise Habits: Home exercise routine, Type of exercise: walking, Time (Minutes): 30, Frequency (Times/Week): 3, Weekly Exercise (Minutes/Week): 90, Intensity: Mild  Goals    . Maintain Healthy Lifestyle     Healthy diet Exercise        Fall Risk Fall Risk  05/26/2018 05/25/2017 02/02/2017 05/19/2016 04/24/2015  Falls in the past year? 0 No No No No   Depression Screen PHQ 2/9 Scores 05/26/2018 05/25/2017 02/02/2017 05/19/2016  PHQ - 2 Score 0 0 0 0  PHQ- 9 Score - 0 - -     Cognitive Function MMSE - Mini Mental State Exam 05/19/2016  Orientation to time 5  Orientation to Place 5  Registration 3  Attention/ Calculation 5  Recall 3  Language- name 2 objects 2  Language- repeat 1  Language- follow 3 step command 3  Language- read & follow direction 1  Write a sentence 1  Copy design 1  Total score 30     6CIT Screen 05/26/2018 05/25/2017  What Year? 0 points 0 points  What month? 0 points 0 points  What time? 0 points 0 points  Count back from 20 0 points 0 points  Months in reverse 0 points 0 points  Repeat phrase 0 points 0 points  Total Score 0 0    Immunization History  Administered Date(s) Administered  . Influenza  Whole 07/14/2007, 05/15/2009, 06/13/2010  . Influenza, High Dose Seasonal PF 04/29/2017,  05/26/2018  . Influenza,inj,Quad PF,6+ Mos 03/09/2013, 04/04/2014, 04/24/2015, 04/25/2016  . Influenza-Unspecified 05/19/2011  . Pneumococcal Conjugate-13 03/15/2014  . Pneumococcal Polysaccharide-23 05/25/2017  . Tetanus 03/09/2013  . Zoster 11/01/2007   Screening Tests Health Maintenance  Topic Date Due  . MAMMOGRAM  01/09/2020  . TETANUS/TDAP  03/10/2023  . INFLUENZA VACCINE  Completed  . DEXA SCAN  Completed  . PNA vac Low Risk Adult  Completed      Plan:    End of life planning; Advance aging; Advanced directives discussed. Copy of current HCPOA/Living Will requested.    I have personally reviewed and noted the following in the patient's chart:   . Medical and social history . Use of alcohol, tobacco or illicit drugs  . Current medications and supplements . Functional ability and status . Nutritional status . Physical activity . Advanced directives . List of other physicians . Hospitalizations, surgeries, and ER visits in previous 12 months . Vitals . Screenings to include cognitive, depression, and falls . Referrals and appointments  In addition, I have reviewed and discussed with patient certain preventive protocols, quality metrics, and best practice recommendations. A written personalized care plan for preventive services as well as general preventive health recommendations were provided to patient.     OBrien-Blaney, Klyde Banka L, LPN  [id number]   I have reviewed the above information and agree with above.   Deborra Medina, MD

## 2018-05-28 DIAGNOSIS — H35372 Puckering of macula, left eye: Secondary | ICD-10-CM | POA: Diagnosis not present

## 2018-05-28 DIAGNOSIS — H35033 Hypertensive retinopathy, bilateral: Secondary | ICD-10-CM | POA: Diagnosis not present

## 2018-05-28 DIAGNOSIS — H25813 Combined forms of age-related cataract, bilateral: Secondary | ICD-10-CM | POA: Diagnosis not present

## 2018-05-28 DIAGNOSIS — H353132 Nonexudative age-related macular degeneration, bilateral, intermediate dry stage: Secondary | ICD-10-CM | POA: Diagnosis not present

## 2018-07-16 ENCOUNTER — Other Ambulatory Visit: Payer: Self-pay | Admitting: Internal Medicine

## 2018-08-02 ENCOUNTER — Other Ambulatory Visit: Payer: Self-pay | Admitting: *Deleted

## 2018-08-02 MED ORDER — CLOPIDOGREL BISULFATE 75 MG PO TABS: 75.0000 mg | ORAL_TABLET | Freq: Every day | ORAL | 2 refills | Status: DC

## 2018-09-13 ENCOUNTER — Other Ambulatory Visit: Payer: Self-pay | Admitting: Cardiology

## 2018-10-01 ENCOUNTER — Telehealth: Payer: Self-pay

## 2018-10-01 NOTE — Telephone Encounter (Signed)
   Primary Cardiologist:  Peter [name], MD   Patient contacted.  History reviewed.  No symptoms to suggest any unstable cardiac conditions.  Based on discussion, with current pandemic situation, we will be postponing this 10/06/18 appointment for Veronica Barron. If symptoms change, she has been instructed to contact our office.   Appointment rescheduled to 12/13/18 at 2:40 pm.  Kathyrn Lass, LPN  09/27/4097 2:78 PM         .

## 2018-10-06 ENCOUNTER — Ambulatory Visit: Payer: Medicare Other | Admitting: Cardiology

## 2018-10-28 ENCOUNTER — Other Ambulatory Visit: Payer: Self-pay | Admitting: Internal Medicine

## 2018-12-13 ENCOUNTER — Telehealth: Payer: Medicare Other | Admitting: Cardiology

## 2018-12-17 ENCOUNTER — Other Ambulatory Visit: Payer: Self-pay | Admitting: Internal Medicine

## 2018-12-17 DIAGNOSIS — E034 Atrophy of thyroid (acquired): Secondary | ICD-10-CM

## 2018-12-17 DIAGNOSIS — E7800 Pure hypercholesterolemia, unspecified: Secondary | ICD-10-CM

## 2018-12-17 DIAGNOSIS — R7301 Impaired fasting glucose: Secondary | ICD-10-CM

## 2018-12-17 NOTE — Telephone Encounter (Signed)
Refilled: 07/16/2018 Last OV: 02/22/2018 Next OV: 05/30/2019

## 2018-12-18 NOTE — Telephone Encounter (Signed)
Thyroid medication refilled for 90 days only.  , please call to make a fasting labs appt. SHE IS OVERDUE   The labs have been ordered.

## 2019-01-05 NOTE — Telephone Encounter (Signed)
Pt is scheduled for fasting labs and she is aware of her appt date and time.

## 2019-01-07 ENCOUNTER — Other Ambulatory Visit: Payer: Self-pay

## 2019-01-07 ENCOUNTER — Other Ambulatory Visit (INDEPENDENT_AMBULATORY_CARE_PROVIDER_SITE_OTHER): Payer: Medicare Other

## 2019-01-07 DIAGNOSIS — E7800 Pure hypercholesterolemia, unspecified: Secondary | ICD-10-CM

## 2019-01-07 DIAGNOSIS — E034 Atrophy of thyroid (acquired): Secondary | ICD-10-CM | POA: Diagnosis not present

## 2019-01-07 DIAGNOSIS — R7301 Impaired fasting glucose: Secondary | ICD-10-CM

## 2019-01-07 LAB — HEMOGLOBIN A1C: Hgb A1c MFr Bld: 6.4 % (ref 4.6–6.5)

## 2019-01-07 LAB — COMPREHENSIVE METABOLIC PANEL
ALT: 13 U/L (ref 0–35)
AST: 18 U/L (ref 0–37)
Albumin: 4.2 g/dL (ref 3.5–5.2)
Alkaline Phosphatase: 53 U/L (ref 39–117)
BUN: 12 mg/dL (ref 6–23)
CO2: 26 mEq/L (ref 19–32)
Calcium: 9.3 mg/dL (ref 8.4–10.5)
Chloride: 105 mEq/L (ref 96–112)
Creatinine, Ser: 0.78 mg/dL (ref 0.40–1.20)
GFR: 71.33 mL/min (ref >60.00)
Glucose, Bld: 108 mg/dL — ABNORMAL HIGH (ref 70–99)
Potassium: 4.5 mEq/L (ref 3.5–5.1)
Sodium: 141 mEq/L (ref 135–145)
Total Bilirubin: 0.5 mg/dL (ref 0.2–1.2)
Total Protein: 6.6 g/dL (ref 6.0–8.3)

## 2019-01-07 LAB — TSH: TSH: 1.63 u[IU]/mL (ref 0.35–4.50)

## 2019-01-08 LAB — LIPID PANEL WITH LDL/HDL RATIO
Cholesterol, Total: 255 mg/dL — ABNORMAL HIGH (ref 100–199)
HDL: 50 mg/dL (ref >39)
LDL Calculated: 153 mg/dL — ABNORMAL HIGH (ref 0–99)
LDl/HDL Ratio: 3.1 ratio (ref 0.0–3.2)
Triglycerides: 258 mg/dL — ABNORMAL HIGH (ref 0–149)
VLDL Cholesterol Cal: 52 mg/dL — ABNORMAL HIGH (ref 5–40)

## 2019-01-10 ENCOUNTER — Telehealth: Payer: Self-pay | Admitting: Internal Medicine

## 2019-01-10 NOTE — Telephone Encounter (Signed)
Her labs were automatically resulted so they are not attached,  No significant change , except that Your  fasting glucose has never been  diagnostic of diabetes; but your A1c is now 6.4 which suggest   that you are at  Greatly  increased  risk for developing type 2 Diabetes over the next ten years.  Reducing your daily intake of starches,  And starting a regular exercise program has been shown to delay the progression to diabetes for a long time.     I would like to check it again In 6 months.  Regards,   Deborra Medina, MD   Lab Results  Component Value Date   HGBA1C 6.4 01/07/2019

## 2019-01-11 NOTE — Telephone Encounter (Signed)
Spoke with pt and informed her of her lab results. Pt gave a verbal understanding.  

## 2019-01-15 ENCOUNTER — Other Ambulatory Visit: Payer: Self-pay | Admitting: Internal Medicine

## 2019-01-20 NOTE — Progress Notes (Deleted)
Veronica Barron Date of Birth: [birth year] Medical Record #[id number]  History of Present Illness: [name] is seen today for followup CAD. She has a history of hypertension, hyperlipidemia, and hypothyroidism. In Sept 2014 she had stenting of the proximal LAD with DES.  The distal left circumflex was occluded with left to left collaterals and right-to-left collaterals. The right coronary was without significant disease. Ejection fraction was 30%.  Her other disease was treated medically. On followup Echo in Dec. 2014 EF had improved to 50-55%. In November 2015 she had a significant episode of chest pain. Repeat cardiac cath showed occlusion of the distal LCx and the stent in the LAD was patent.  She has a history of intolerance to multiple statins, Zetia and Lopid, and fenofibrates. She was previously unable afford a PCSK 9 inhibitor and was seen by Pharm D in October to reassess.   On follow up today she reports she is feeling very well. No chest pain or SOB. Walks daily and does yard work.    Current Outpatient Medications on File Prior to Visit  Medication Sig Dispense Refill  . ALFALFA PO Take 1 tablet by mouth daily.    Marland Kitchen aspirin EC 81 MG tablet Take 81 mg by mouth daily.    . carvedilol (COREG) 6.25 MG tablet TAKE 1 TABLET BY MOUTH TWICE DAILY WITH MEALS 180 tablet 3  . clopidogrel (PLAVIX) 75 MG tablet Take 1 tablet (75 mg total) by mouth daily. 90 tablet 2  . clorazepate (TRANXENE-T) 7.5 MG tablet Take 1 tablet (7.5 mg total) by mouth at bedtime as needed for anxiety. 30 tablet 0  . Evolocumab (REPATHA SURECLICK) 832 MG/ML SOAJ Inject 140 mg into the skin every 14 (fourteen) days. 2 pen 12  . levothyroxine (SYNTHROID) 75 MCG tablet TAKE 1 TABLET BY MOUTH DAILY BEFORE BREAKFAST. LABS NEEDED PRIOR TO NEXT REFILL. 90 tablet 0  . lisinopril (PRINIVIL,ZESTRIL) 5 MG tablet TAKE 1 TABLET BY MOUTH TWICE DAILY 180 tablet 1  . multivitamin-lutein (OCUVITE-LUTEIN) CAPS capsule Take 1 capsule by  mouth daily.    Marland Kitchen omeprazole (PRILOSEC) 20 MG capsule Take 1 capsule (20 mg total) by mouth daily. 30 capsule 0  . sertraline (ZOLOFT) 50 MG tablet TAKE 1 TABLET BY MOUTH DAILY 90 tablet 1  . vitamin B-12 (CYANOCOBALAMIN) 500 MCG tablet Take 500 mcg by mouth daily.    . vitamin C (ASCORBIC ACID) 500 MG tablet Take 500 mg by mouth daily.     No current facility-administered medications on file prior to visit.     Allergies  Allergen Reactions  . Diphenhydramine Hcl     REACTION: TACHYCARDIA  . Fenofibrate Other (See Comments)    constipation  . Statins     Myalgia   . Zetia [Ezetimibe] Other (See Comments)    Myalgias     Past Medical History:  Diagnosis Date  . Allergic rhinitis, cause unspecified   . CAD (coronary artery disease)    a. 03/2013 Abnl MV, EF 29%;  b. 03/2013 Cath/PCI: >M nl, LAD 99p (2.5x28 Promus Prem DES), LCX 100 after large OM1, RCA dominant, 20p, EF 30%.  . Diverticulosis of colon   . Hyperlipidemia   . Hypertension   . Ischemic cardiomyopathy    a. 03/2013 EF 30% by LV gram.;  b. Echo (12/14): Mod LVH with severe basal septal hypertrophy w/o LVOT obstr, EF 50-55%, normal wall motion, Gr 1 DD, MAC, mild MR, mild LAE, trivial eff.  . Leiomyoma of uterus,  unspecified   . Unspecified hypothyroidism     Past Surgical History:  Procedure Laterality Date  . ABDOMINAL HYSTERECTOMY     fibroid   at  age 76  . CHOLECYSTECTOMY    . LEFT HEART CATHETERIZATION WITH CORONARY ANGIOGRAM N/A 03/16/2013   Procedure: LEFT HEART CATHETERIZATION WITH CORONARY ANGIOGRAM;  Surgeon: [name] [name], MD;  Location: [facility] CATH LAB;  Service: Cardiovascular;  Laterality: N/A;  . LEFT HEART CATHETERIZATION WITH CORONARY ANGIOGRAM N/A 05/23/2014   Procedure: LEFT HEART CATHETERIZATION WITH CORONARY ANGIOGRAM;  Surgeon: [name] [name], MD;  Location[facility] CATH LAB;  Service: Cardiovascular;  Laterality: N/A;  . PERCUTANEOUS CORONARY STENT INTERVENTION (PCI-S)  03/16/2013   Procedure:  PERCUTANEOUS CORONARY STENT INTERVENTION (PCI-S);  Surgeon: [name] [name], MD;  Location: [facility] CATH LAB;  Service: Cardiovascular;;  . TUBAL LIGATION      Social History   Tobacco Use  Smoking Status Never Smoker  Smokeless Tobacco Never Used    Social History   Substance and Sexual Activity  Alcohol Use Yes   Comment: wine    Family History  Problem Relation Age of Onset  . Dementia Mother   . Hyperlipidemia Mother   . Hypertension Mother   . Coronary artery disease Father   . Hypertension Father   . Hyperlipidemia Father   . Uterine cancer Sister 23  . Breast cancer Maternal Aunt     Review of Systems: As noted in history of present illness.  All other systems were reviewed and are negative.  Physical Exam: There were no vitals taken for this visit. GENERAL:  Well appearing WF in NAD HEENT:  PERRL, EOMI, sclera are clear. Oropharynx is clear. NECK:  No jugular venous distention, carotid upstroke brisk and symmetric, no bruits, no thyromegaly or adenopathy LUNGS:  Clear to auscultation bilaterally CHEST:  Unremarkable HEART:  RRR,  PMI not displaced or sustained,S1 and S2 within normal limits, no S3, no S4: no clicks, no rubs, no murmurs ABD:  Soft, nontender. BS +, no masses or bruits. No hepatomegaly, no splenomegaly EXT:  2 + pulses throughout, no edema, no cyanosis no clubbing SKIN:  Warm and dry.  No rashes NEURO:  Alert and oriented x 3. Cranial nerves II through XII intact. 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 T wave inversion in 1 and avl. I have personally reviewed and interpreted this study. No change.  Assessment / Plan: 1. Coronary disease status post stenting of the proximal LAD in September 2014 with drug-eluting stent. 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 Cardiac cath in November 2015 showed continued stent patency. She is asymptomatic.  Continue current therapy.   2. Hyperlipidemia. History of intolerance to multiple statins, Zetia, fenofibrates, and  Lopid. Continue  dietary modifications. Will check with Pharm D to relook at the possibility of a PCSK 9 inhibitor.   3. Hypertension- good control. Continue Rx.  4. Hypothyroidism. TSH normal.

## 2019-01-24 ENCOUNTER — Ambulatory Visit: Payer: Medicare Other | Admitting: Cardiology

## 2019-01-25 NOTE — Progress Notes (Signed)
Veronica Barron Date of Birth: October 28, 1940 Medical Record #[id number]  History of Present Illness: Veronica Barron is seen today for followup CAD. She has a history of hypertension, hyperlipidemia, and hypothyroidism. In Sept 2014 she had stenting of the proximal LAD with DES.  The distal left circumflex was occluded with left to left collaterals and right-to-left collaterals. The right coronary was without significant disease. Ejection fraction was 30%.  Her other disease was treated medically. On followup Echo in Dec. 2014 EF had improved to 50-55%. In November 2015 she had a significant episode of chest pain. Repeat cardiac cath showed occlusion of the distal LCx and the stent in the LAD was patent.  She has a history of intolerance to multiple statins, Zetia and Lopid, and fenofibrates. She was previously unable afford a PCSK 9 inhibitor and was seen by Pharm D in October to reassess. She states it was still too expensive and didn't start it.   On follow up today she reports she is feeling very well. No chest pain or SOB. She is still quite active with her walking and yard work. She reports BP readings at home are 680-321 systolic and 22-48 diastolic   Current Outpatient Medications on File Prior to Visit  Medication Sig Dispense Refill  . ALFALFA PO Take 1 tablet by mouth daily.    Marland Kitchen aspirin EC 81 MG tablet Take 81 mg by mouth daily.    . carvedilol (COREG) 6.25 MG tablet TAKE 1 TABLET BY MOUTH TWICE DAILY WITH MEALS 180 tablet 3  . clopidogrel (PLAVIX) 75 MG tablet Take 1 tablet (75 mg total) by mouth daily. 90 tablet 2  . clorazepate (TRANXENE-T) 7.5 MG tablet Take 1 tablet (7.5 mg total) by mouth at bedtime as needed for anxiety. 30 tablet 0  . Evolocumab (REPATHA SURECLICK) 250 MG/ML SOAJ Inject 140 mg into the skin every 14 (fourteen) days. 2 pen 12  . levothyroxine (SYNTHROID) 75 MCG tablet TAKE 1 TABLET BY MOUTH DAILY BEFORE BREAKFAST. LABS NEEDED PRIOR TO NEXT REFILL. 90 tablet 0  .  lisinopril (PRINIVIL,ZESTRIL) 5 MG tablet TAKE 1 TABLET BY MOUTH TWICE DAILY 180 tablet 1  . multivitamin-lutein (OCUVITE-LUTEIN) CAPS capsule Take 1 capsule by mouth daily.    Marland Kitchen omeprazole (PRILOSEC) 20 MG capsule Take 1 capsule (20 mg total) by mouth daily. 30 capsule 0  . sertraline (ZOLOFT) 50 MG tablet TAKE 1 TABLET BY MOUTH DAILY 90 tablet 1  . vitamin B-12 (CYANOCOBALAMIN) 500 MCG tablet Take 500 mcg by mouth daily.    . vitamin C (ASCORBIC ACID) 500 MG tablet Take 500 mg by mouth daily.     No current facility-administered medications on file prior to visit.     Allergies  Allergen Reactions  . Diphenhydramine Hcl     REACTION: TACHYCARDIA  . Fenofibrate Other (See Comments)    constipation  . Statins     Myalgia   . Zetia [Ezetimibe] Other (See Comments)    Myalgias     Past Medical History:  Diagnosis Date  . Allergic rhinitis, cause unspecified   . CAD (coronary artery disease)    a. 03/2013 Abnl MV, EF 29%;  b. 03/2013 Cath/PCI: >M nl, LAD 99p (2.5x28 Promus Prem DES), LCX 100 after large OM1, RCA dominant, 20p, EF 30%.  . Diverticulosis of colon   . Hyperlipidemia   . Hypertension   . Ischemic cardiomyopathy    a. 03/2013 EF 30% by LV gram.;  b. Echo (12/14): Mod LVH with  severe basal septal hypertrophy w/o LVOT obstr, EF 50-55%, normal wall motion, Gr 1 DD, MAC, mild MR, mild LAE, trivial eff.  . Leiomyoma of uterus, unspecified   . Unspecified hypothyroidism     Past Surgical History:  Procedure Laterality Date  . ABDOMINAL HYSTERECTOMY     fibroid   at  age 68  . CHOLECYSTECTOMY    . LEFT HEART CATHETERIZATION WITH CORONARY ANGIOGRAM N/A 03/16/2013   Procedure: LEFT HEART CATHETERIZATION WITH CORONARY ANGIOGRAM;  Surgeon:  M [name], MD;  Location[facility] CATH LAB;  Service: Cardiovascular;  Laterality: N/A;  . LEFT HEART CATHETERIZATION WITH CORONARY ANGIOGRAM N/A 05/23/2014   Procedure: LEFT HEART CATHETERIZATION WITH CORONARY ANGIOGRAM;  Surgeon:  M  [name], MD;  Location: [facility] CATH LAB;  Service: Cardiovascular;  Laterality: N/A;  . PERCUTANEOUS CORONARY STENT INTERVENTION (PCI-S)  03/16/2013   Procedure: PERCUTANEOUS CORONARY STENT INTERVENTION (PCI-S);  Surgeon:  M [name], MD;  Location: [facility] CATH LAB;  Service: Cardiovascular;;  . TUBAL LIGATION      Social History   Tobacco Use  Smoking Status Never Smoker  Smokeless Tobacco Never Used    Social History   Substance and Sexual Activity  Alcohol Use Yes   Comment: wine    Family History  Problem Relation Age of Onset  . Dementia Mother   . Hyperlipidemia Mother   . Hypertension Mother   . Coronary artery disease Father   . Hypertension Father   . Hyperlipidemia Father   . Uterine cancer Sister 28  . Breast cancer Maternal Aunt     Review of Systems: As noted in history of present illness.  All other systems were reviewed and are negative.  Physical Exam: BP (!) 158/90   Pulse 67   Temp (!) 97.3 F (36.3 C) (Temporal)   Ht _0  (1.6 m)   Wt 153 lb 3.2 oz (69.5 kg)   BMI 27.14 kg/m  GENERAL:  Well appearing WF in NAD HEENT:  PERRL, EOMI, sclera are clear. Oropharynx is clear. NECK:  No jugular venous distention, carotid upstroke brisk and symmetric, no bruits, no thyromegaly or adenopathy LUNGS:  Clear to auscultation bilaterally CHEST:  Unremarkable HEART:  RRR,  PMI not displaced or sustained,S1 and S2 within normal limits, no S3, no S4: no clicks, no rubs, no murmurs ABD:  Soft, nontender. BS +, no masses or bruits. No hepatomegaly, no splenomegaly EXT:  2 + pulses throughout, no edema, no cyanosis no clubbing SKIN:  Warm and dry.  No rashes NEURO:  Alert and oriented x 3. Cranial nerves II through XII intact. PSYCH:  Cognitively intact  LABORATORY DATA: Lab Results  Component Value Date   WBC 8.8 02/22/2018   HGB 13.1 02/22/2018   HCT 38.9 02/22/2018   PLT 379.0 02/22/2018   GLUCOSE 108 (H) 01/07/2019   CHOL 255 (H) 01/07/2019   TRIG 258 (H)  01/07/2019   HDL 50 01/07/2019   LDLDIRECT 193.0 04/26/2018   LDLCALC 153 (H) 01/07/2019   ALT 13 01/07/2019   AST 18 01/07/2019   NA 141 01/07/2019   K 4.5 01/07/2019   CL 105 01/07/2019   CREATININE 0.78 01/07/2019   BUN 12 01/07/2019   CO2 26 01/07/2019   TSH 1.63 01/07/2019   INR 0.92 05/17/2014   HGBA1C 6.4 01/07/2019   Ecg today shows NSR with PVCs. Old anterior infarct. T wave inversion in 1 and avl. I have personally reviewed and interpreted this study. No change.  Assessment / Plan:  1. Coronary disease status post stenting of the proximal LAD in September 2014 with drug-eluting stent. 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 Cardiac cath in November 2015 showed continued stent patency. She is asymptomatic.  Continue current therapy.   2. Hyperlipidemia. History of intolerance to multiple statins, Zetia, fenofibrates, and Lopid. Continue  dietary modifications. I spoke  with Pharm D today and we will look into support from the [employer] for [place].   3. Hypertension- good control. Continue Rx.  4. Hypothyroidism. TSH normal.

## 2019-01-26 ENCOUNTER — Ambulatory Visit (INDEPENDENT_AMBULATORY_CARE_PROVIDER_SITE_OTHER): Payer: Medicare Other | Admitting: Cardiology

## 2019-01-26 ENCOUNTER — Encounter: Payer: Self-pay | Admitting: Cardiology

## 2019-01-26 ENCOUNTER — Other Ambulatory Visit: Payer: Self-pay

## 2019-01-26 VITALS — BP 158/90 | HR 67 | Temp 97.3°F | Ht 63.0 in | Wt 153.2 lb

## 2019-01-26 DIAGNOSIS — I2510 Atherosclerotic heart disease of native coronary artery without angina pectoris: Secondary | ICD-10-CM

## 2019-01-26 DIAGNOSIS — I10 Essential (primary) hypertension: Secondary | ICD-10-CM

## 2019-01-26 DIAGNOSIS — E7800 Pure hypercholesterolemia, unspecified: Secondary | ICD-10-CM

## 2019-01-26 NOTE — Patient Instructions (Signed)
Medication Instructions:  Continue same medications If you need a refill on your cardiac medications before your next appointment, please call your pharmacy.   Lab work: None ordered   Testing/Procedures: None ordered  Follow-Up: At [facility], you and your health needs are our priority.  As part of our continuing mission to provide you with exceptional heart care, we have created designated Provider Care Teams.  These Care Teams include your primary Cardiologist (physician) and Advanced Practice Providers (APPs -  Physician Assistants and Nurse Practitioners) who all work together to provide you with the care you need, when you need it. . Schedule follow up in 6 months    Call 3 months before to schedule

## 2019-01-27 NOTE — Addendum Note (Signed)
Addended by: Zebedee Iba on: 01/27/2019 03:58 PM   Modules accepted: Orders

## 2019-02-02 ENCOUNTER — Telehealth: Payer: Self-pay

## 2019-02-02 NOTE — Telephone Encounter (Signed)
Called and spoke w/pt regarding [employer] instructed them to call [employer] [facility] pt voiced they would try to complete it by phone

## 2019-02-09 ENCOUNTER — Telehealth: Payer: Self-pay | Admitting: Cardiology

## 2019-02-09 NOTE — Telephone Encounter (Signed)
New Message   Patient has some questions about the Evolocumab (REPATHA SURECLICK) 699 MG/ML SOAJ and getting assistance paying for it. Please give patient a call back.

## 2019-02-09 NOTE — Telephone Encounter (Signed)
Call returned to the patient. She needed help with the assistance forms for [employer]. If she needs anything else she has been advised to call us back. She will bring the forms back when she has them completed.

## 2019-02-11 ENCOUNTER — Other Ambulatory Visit: Payer: Self-pay | Admitting: Pharmacist Clinician (PhC)/ Clinical Pharmacy Specialist

## 2019-02-11 MED ORDER — REPATHA SURECLICK 140 MG/ML ~~LOC~~ SOAJ: 140.0000 mg | SUBCUTANEOUS | 12 refills | Status: DC

## 2019-03-01 ENCOUNTER — Other Ambulatory Visit: Payer: Self-pay | Admitting: Internal Medicine

## 2019-03-01 DIAGNOSIS — Z1231 Encounter for screening mammogram for malignant neoplasm of breast: Secondary | ICD-10-CM

## 2019-03-04 ENCOUNTER — Ambulatory Visit
Admission: RE | Admit: 2019-03-04 | Discharge: 2019-03-04 | Disposition: A | Discharge status: Home / Self Care | Payer: Medicare Other | Source: Ambulatory Visit | Attending: Internal Medicine | Admitting: Internal Medicine

## 2019-03-04 ENCOUNTER — Other Ambulatory Visit: Payer: Self-pay

## 2019-03-04 DIAGNOSIS — Z1231 Encounter for screening mammogram for malignant neoplasm of breast: Secondary | ICD-10-CM | POA: Diagnosis not present

## 2019-03-10 ENCOUNTER — Other Ambulatory Visit: Payer: Self-pay | Admitting: Internal Medicine

## 2019-03-11 ENCOUNTER — Other Ambulatory Visit: Payer: Self-pay

## 2019-03-11 MED ORDER — LISINOPRIL 5 MG PO TABS: 5.0000 mg | ORAL_TABLET | Freq: Two times a day (BID) | ORAL | 1 refills | Status: DC

## 2019-04-26 ENCOUNTER — Ambulatory Visit
Admission: RE | Admit: 2019-04-26 | Discharge: 2019-04-26 | Disposition: A | Discharge status: Home / Self Care | Payer: Medicare Other | Source: Ambulatory Visit | Attending: Family Medicine | Admitting: Family Medicine

## 2019-04-26 ENCOUNTER — Emergency Department: Payer: Medicare Other | Admitting: Certified Registered Nurse Anesthetist

## 2019-04-26 ENCOUNTER — Telehealth: Payer: Self-pay

## 2019-04-26 ENCOUNTER — Ambulatory Visit (INDEPENDENT_AMBULATORY_CARE_PROVIDER_SITE_OTHER): Payer: Medicare Other | Admitting: Family Medicine

## 2019-04-26 ENCOUNTER — Encounter
Admission: EM | Disposition: A | Discharge status: Home / Self Care | Payer: Self-pay | Source: Home / Self Care | Attending: Emergency Medicine

## 2019-04-26 ENCOUNTER — Other Ambulatory Visit: Payer: Self-pay

## 2019-04-26 ENCOUNTER — Observation Stay
Admission: EM | Admit: 2019-04-26 | Discharge: 2019-04-27 | Disposition: A | Discharge status: Home / Self Care | Payer: Medicare Other | Attending: Surgery | Admitting: Surgery

## 2019-04-26 DIAGNOSIS — E785 Hyperlipidemia, unspecified: Secondary | ICD-10-CM | POA: Insufficient documentation

## 2019-04-26 DIAGNOSIS — Z20828 Contact with and (suspected) exposure to other viral communicable diseases: Secondary | ICD-10-CM | POA: Insufficient documentation

## 2019-04-26 DIAGNOSIS — Z955 Presence of coronary angioplasty implant and graft: Secondary | ICD-10-CM | POA: Diagnosis not present

## 2019-04-26 DIAGNOSIS — I255 Ischemic cardiomyopathy: Secondary | ICD-10-CM | POA: Insufficient documentation

## 2019-04-26 DIAGNOSIS — Z79899 Other long term (current) drug therapy: Secondary | ICD-10-CM | POA: Insufficient documentation

## 2019-04-26 DIAGNOSIS — K3580 Unspecified acute appendicitis: Secondary | ICD-10-CM

## 2019-04-26 DIAGNOSIS — Z7902 Long term (current) use of antithrombotics/antiplatelets: Secondary | ICD-10-CM | POA: Insufficient documentation

## 2019-04-26 DIAGNOSIS — R1031 Right lower quadrant pain: Secondary | ICD-10-CM

## 2019-04-26 DIAGNOSIS — Z79890 Hormone replacement therapy (postmenopausal): Secondary | ICD-10-CM | POA: Insufficient documentation

## 2019-04-26 DIAGNOSIS — I10 Essential (primary) hypertension: Secondary | ICD-10-CM | POA: Insufficient documentation

## 2019-04-26 DIAGNOSIS — E039 Hypothyroidism, unspecified: Secondary | ICD-10-CM | POA: Diagnosis not present

## 2019-04-26 DIAGNOSIS — R109 Unspecified abdominal pain: Secondary | ICD-10-CM | POA: Diagnosis not present

## 2019-04-26 DIAGNOSIS — I2510 Atherosclerotic heart disease of native coronary artery without angina pectoris: Secondary | ICD-10-CM | POA: Insufficient documentation

## 2019-04-26 DIAGNOSIS — Z7982 Long term (current) use of aspirin: Secondary | ICD-10-CM | POA: Diagnosis not present

## 2019-04-26 DIAGNOSIS — Z9049 Acquired absence of other specified parts of digestive tract: Secondary | ICD-10-CM | POA: Diagnosis present

## 2019-04-26 DIAGNOSIS — E871 Hypo-osmolality and hyponatremia: Secondary | ICD-10-CM | POA: Insufficient documentation

## 2019-04-26 DIAGNOSIS — K37 Unspecified appendicitis: Secondary | ICD-10-CM | POA: Diagnosis present

## 2019-04-26 DIAGNOSIS — K3533 Acute appendicitis with perforation and localized peritonitis, with abscess: Principal | ICD-10-CM | POA: Insufficient documentation

## 2019-04-26 DIAGNOSIS — Z03818 Encounter for observation for suspected exposure to other biological agents ruled out: Secondary | ICD-10-CM | POA: Diagnosis not present

## 2019-04-26 HISTORY — PX: LAPAROSCOPIC APPENDECTOMY: SHX408

## 2019-04-26 LAB — SARS CORONAVIRUS 2 BY RT PCR (HOSPITAL ORDER, PERFORMED IN ~~LOC~~ HOSPITAL LAB): SARS Coronavirus 2: NEGATIVE

## 2019-04-26 LAB — URINALYSIS, COMPLETE (UACMP) WITH MICROSCOPIC
Bacteria, UA: NONE SEEN
Bilirubin Urine: NEGATIVE
Glucose, UA: NEGATIVE mg/dL
Hgb urine dipstick: NEGATIVE
Ketones, ur: NEGATIVE mg/dL
Nitrite: NEGATIVE
Protein, ur: NEGATIVE mg/dL
Specific Gravity, Urine: 1.038 — ABNORMAL HIGH (ref 1.005–1.030)
Squamous Epithelial / HPF: NONE SEEN (ref 0–5)
pH: 7 (ref 5.0–8.0)

## 2019-04-26 LAB — LIPASE, BLOOD: Lipase: 41 U/L (ref 11–51)

## 2019-04-26 LAB — COMPREHENSIVE METABOLIC PANEL
ALT: 22 U/L (ref 0–44)
AST: 26 U/L (ref 15–41)
Albumin: 3.6 g/dL (ref 3.5–5.0)
Alkaline Phosphatase: 67 U/L (ref 38–126)
Anion gap: 10 (ref 5–15)
BUN: 11 mg/dL (ref 8–23)
CO2: 24 mmol/L (ref 22–32)
Calcium: 9 mg/dL (ref 8.9–10.3)
Chloride: 98 mmol/L (ref 98–111)
Creatinine, Ser: 0.73 mg/dL (ref 0.44–1.00)
GFR calc Af Amer: >60 mL/min (ref >60)
GFR calc non Af Amer: >60 mL/min (ref >60)
Glucose, Bld: 177 mg/dL — ABNORMAL HIGH (ref 70–99)
Potassium: 4.6 mmol/L (ref 3.5–5.1)
Sodium: 132 mmol/L — ABNORMAL LOW (ref 135–145)
Total Bilirubin: 0.5 mg/dL (ref 0.3–1.2)
Total Protein: 7 g/dL (ref 6.5–8.1)

## 2019-04-26 LAB — CBC
HCT: 38 % (ref 36.0–46.0)
Hemoglobin: 11.9 g/dL — ABNORMAL LOW (ref 12.0–15.0)
MCH: 25.7 pg — ABNORMAL LOW (ref 26.0–34.0)
MCHC: 31.3 g/dL (ref 30.0–36.0)
MCV: 82.1 fL (ref 80.0–100.0)
Platelets: 348 10*3/uL (ref 150–400)
RBC: 4.63 MIL/uL (ref 3.87–5.11)
RDW: 14.8 % (ref 11.5–15.5)
WBC: 7.5 10*3/uL (ref 4.0–10.5)
nRBC: 0 % (ref 0.0–0.2)

## 2019-04-26 LAB — POCT I-STAT CREATININE: Creatinine, Ser: 0.8 mg/dL (ref 0.44–1.00)

## 2019-04-26 SURGERY — APPENDECTOMY, LAPAROSCOPIC
Anesthesia: General | Site: Abdomen

## 2019-04-26 MED ORDER — FENTANYL CITRATE (PF) 100 MCG/2ML IJ SOLN
INTRAMUSCULAR | Status: AC
  Administered 2019-04-26: 50 ug via INTRAVENOUS
  Filled 2019-04-26: qty 2

## 2019-04-26 MED ORDER — PROPOFOL 10 MG/ML IV BOLUS
INTRAVENOUS | Status: DC | PRN
  Administered 2019-04-26: 100 mg via INTRAVENOUS

## 2019-04-26 MED ORDER — MORPHINE SULFATE (PF) 2 MG/ML IV SOLN: 2.0000 mg | INTRAVENOUS | Status: DC | PRN

## 2019-04-26 MED ORDER — SUGAMMADEX SODIUM 500 MG/5ML IV SOLN
INTRAVENOUS | Status: DC | PRN
  Administered 2019-04-26: 140 mg via INTRAVENOUS

## 2019-04-26 MED ORDER — ONDANSETRON HCL 4 MG/2ML IJ SOLN: 4.0000 mg | Freq: Once | INTRAMUSCULAR | Status: DC | PRN

## 2019-04-26 MED ORDER — PROPOFOL 10 MG/ML IV BOLUS
INTRAVENOUS | Status: AC
  Filled 2019-04-26: qty 20

## 2019-04-26 MED ORDER — OXYCODONE HCL 5 MG PO TABS
5.0000 mg | ORAL_TABLET | ORAL | Status: DC | PRN
  Administered 2019-04-26: 10 mg via ORAL
  Administered 2019-04-27: 5 mg via ORAL
  Administered 2019-04-27 (×2): 10 mg via ORAL
  Filled 2019-04-26 (×4): qty 2

## 2019-04-26 MED ORDER — FENTANYL CITRATE (PF) 100 MCG/2ML IJ SOLN
50.0000 ug | Freq: Once | INTRAMUSCULAR | Status: AC
  Administered 2019-04-26: 50 ug via INTRAVENOUS

## 2019-04-26 MED ORDER — ONDANSETRON HCL 4 MG/2ML IJ SOLN: 4.0000 mg | Freq: Four times a day (QID) | INTRAMUSCULAR | Status: DC | PRN

## 2019-04-26 MED ORDER — ACETAMINOPHEN 10 MG/ML IV SOLN
INTRAVENOUS | Status: DC | PRN
  Administered 2019-04-26: 1000 mg via INTRAVENOUS

## 2019-04-26 MED ORDER — PANTOPRAZOLE SODIUM 40 MG IV SOLR
40.0000 mg | Freq: Two times a day (BID) | INTRAVENOUS | Status: DC
  Administered 2019-04-26 – 2019-04-27 (×2): 40 mg via INTRAVENOUS
  Filled 2019-04-26 (×2): qty 40

## 2019-04-26 MED ORDER — ONDANSETRON HCL 4 MG/2ML IJ SOLN
INTRAMUSCULAR | Status: AC
  Filled 2019-04-26: qty 2

## 2019-04-26 MED ORDER — SUCCINYLCHOLINE CHLORIDE 20 MG/ML IJ SOLN
INTRAMUSCULAR | Status: DC | PRN
  Administered 2019-04-26: 100 mg via INTRAVENOUS

## 2019-04-26 MED ORDER — ROCURONIUM BROMIDE 100 MG/10ML IV SOLN
INTRAVENOUS | Status: DC | PRN
  Administered 2019-04-26: 20 mg via INTRAVENOUS
  Administered 2019-04-26 (×2): 10 mg via INTRAVENOUS

## 2019-04-26 MED ORDER — PROCHLORPERAZINE EDISYLATE 10 MG/2ML IJ SOLN
5.0000 mg | Freq: Four times a day (QID) | INTRAMUSCULAR | Status: DC | PRN
  Filled 2019-04-26: qty 2

## 2019-04-26 MED ORDER — LIDOCAINE HCL (CARDIAC) PF 100 MG/5ML IV SOSY
PREFILLED_SYRINGE | INTRAVENOUS | Status: DC | PRN
  Administered 2019-04-26: 100 mg via INTRAVENOUS

## 2019-04-26 MED ORDER — CLORAZEPATE DIPOTASSIUM 7.5 MG PO TABS
7.5000 mg | ORAL_TABLET | Freq: Every evening | ORAL | Status: DC | PRN
  Filled 2019-04-26: qty 1

## 2019-04-26 MED ORDER — BUPIVACAINE-EPINEPHRINE 0.25% -1:200000 IJ SOLN
INTRAMUSCULAR | Status: DC | PRN
  Administered 2019-04-26: 30 mL

## 2019-04-26 MED ORDER — SODIUM CHLORIDE 0.9 % IV SOLN
INTRAVENOUS | Status: DC | PRN
  Administered 2019-04-26: 19:00:00 via INTRAVENOUS

## 2019-04-26 MED ORDER — ACETAMINOPHEN 10 MG/ML IV SOLN
INTRAVENOUS | Status: AC
  Filled 2019-04-26: qty 100

## 2019-04-26 MED ORDER — CARVEDILOL 6.25 MG PO TABS
6.2500 mg | ORAL_TABLET | Freq: Two times a day (BID) | ORAL | Status: DC
  Administered 2019-04-27: 6.25 mg via ORAL
  Filled 2019-04-26: qty 1

## 2019-04-26 MED ORDER — SODIUM CHLORIDE 0.9% FLUSH
3.0000 mL | Freq: Once | INTRAVENOUS | Status: AC
  Administered 2019-04-26: 17:00:00 3 mL via INTRAVENOUS

## 2019-04-26 MED ORDER — FENTANYL CITRATE (PF) 100 MCG/2ML IJ SOLN
INTRAMUSCULAR | Status: DC | PRN
  Administered 2019-04-26: 25 ug via INTRAVENOUS
  Administered 2019-04-26: 50 ug via INTRAVENOUS

## 2019-04-26 MED ORDER — ACETAMINOPHEN 500 MG PO TABS
1000.0000 mg | ORAL_TABLET | Freq: Four times a day (QID) | ORAL | Status: DC
  Administered 2019-04-26 – 2019-04-27 (×2): 1000 mg via ORAL
  Filled 2019-04-26 (×2): qty 2

## 2019-04-26 MED ORDER — LIDOCAINE HCL (PF) 2 % IJ SOLN
INTRAMUSCULAR | Status: AC
  Filled 2019-04-26: qty 10

## 2019-04-26 MED ORDER — FENTANYL CITRATE (PF) 100 MCG/2ML IJ SOLN
INTRAMUSCULAR | Status: AC
  Filled 2019-04-26: qty 2

## 2019-04-26 MED ORDER — PIPERACILLIN-TAZOBACTAM 3.375 G IVPB 30 MIN
3.3750 g | Freq: Once | INTRAVENOUS | Status: AC
  Administered 2019-04-26: 3.375 g via INTRAVENOUS
  Filled 2019-04-26: qty 50

## 2019-04-26 MED ORDER — IOHEXOL 300 MG/ML  SOLN
100.0000 mL | Freq: Once | INTRAMUSCULAR | Status: AC | PRN
  Administered 2019-04-26: 13:00:00 100 mL via INTRAVENOUS

## 2019-04-26 MED ORDER — ONDANSETRON HCL 4 MG/2ML IJ SOLN: 4.0000 mg | INTRAMUSCULAR | Status: DC | PRN

## 2019-04-26 MED ORDER — ONDANSETRON HCL 4 MG/2ML IJ SOLN
INTRAMUSCULAR | Status: DC | PRN
  Administered 2019-04-26: 4 mg via INTRAVENOUS

## 2019-04-26 MED ORDER — HYDRALAZINE HCL 20 MG/ML IJ SOLN: 10.0000 mg | INTRAMUSCULAR | Status: DC | PRN

## 2019-04-26 MED ORDER — EPHEDRINE SULFATE 50 MG/ML IJ SOLN
INTRAMUSCULAR | Status: DC | PRN
  Administered 2019-04-26: 10 mg via INTRAVENOUS

## 2019-04-26 MED ORDER — SODIUM CHLORIDE 0.9 % IV SOLN
INTRAVENOUS | Status: DC
  Administered 2019-04-26: 23:00:00 via INTRAVENOUS

## 2019-04-26 MED ORDER — ROCURONIUM BROMIDE 50 MG/5ML IV SOLN
INTRAVENOUS | Status: AC
  Filled 2019-04-26: qty 1

## 2019-04-26 MED ORDER — PROCHLORPERAZINE MALEATE 10 MG PO TABS
10.0000 mg | ORAL_TABLET | Freq: Four times a day (QID) | ORAL | Status: DC | PRN
  Filled 2019-04-26: qty 1

## 2019-04-26 MED ORDER — SODIUM CHLORIDE 0.9 % IV BOLUS
1000.0000 mL | Freq: Once | INTRAVENOUS | Status: AC
  Administered 2019-04-26: 1000 mL via INTRAVENOUS

## 2019-04-26 MED ORDER — FENTANYL CITRATE (PF) 100 MCG/2ML IJ SOLN
25.0000 ug | INTRAMUSCULAR | Status: DC | PRN
  Administered 2019-04-26 (×3): 50 ug via INTRAVENOUS

## 2019-04-26 MED ORDER — ONDANSETRON 4 MG PO TBDP: 4.0000 mg | ORAL_TABLET | Freq: Four times a day (QID) | ORAL | Status: DC | PRN

## 2019-04-26 MED ORDER — LEVOTHYROXINE SODIUM 50 MCG PO TABS
75.0000 ug | ORAL_TABLET | Freq: Every day | ORAL | Status: DC
  Administered 2019-04-27: 75 ug via ORAL
  Filled 2019-04-26: qty 2

## 2019-04-26 MED ORDER — PIPERACILLIN-TAZOBACTAM 3.375 G IVPB
3.3750 g | Freq: Three times a day (TID) | INTRAVENOUS | Status: DC
  Administered 2019-04-27: 3.375 g via INTRAVENOUS
  Filled 2019-04-26: qty 50

## 2019-04-26 SURGICAL SUPPLY — 47 items
APPLICATOR ARISTA FLEXITIP XL (MISCELLANEOUS) ×2 IMPLANT
APPLIER CLIP 5 13 M/L LIGAMAX5 (MISCELLANEOUS)
BLADE CLIPPER SURG (BLADE) ×2 IMPLANT
BULB RESERV EVAC DRAIN JP 100C (MISCELLANEOUS) ×2 IMPLANT
CANISTER SUCT 1200ML W/VALVE (MISCELLANEOUS) ×2 IMPLANT
CHLORAPREP W/TINT 26 (MISCELLANEOUS) ×2 IMPLANT
CLIP APPLIE 5 13 M/L LIGAMAX5 (MISCELLANEOUS) IMPLANT
COVER WAND RF STERILE (DRAPES) ×2 IMPLANT
CUTTER FLEX LINEAR 45M (STAPLE) ×2 IMPLANT
DERMABOND ADVANCED (GAUZE/BANDAGES/DRESSINGS) ×1
DERMABOND ADVANCED .7 DNX12 (GAUZE/BANDAGES/DRESSINGS) ×1 IMPLANT
DRAIN CHANNEL JP 19F (MISCELLANEOUS) ×2 IMPLANT
ELECT CAUTERY BLADE 6.4 (BLADE) ×2 IMPLANT
ELECT REM PT RETURN 9FT ADLT (ELECTROSURGICAL) ×2
ELECTRODE REM PT RTRN 9FT ADLT (ELECTROSURGICAL) ×1 IMPLANT
GLOVE BIO SURGEON STRL SZ7 (GLOVE) ×4 IMPLANT
GLOVE BIOGEL PI IND STRL 7.5 (GLOVE) ×1 IMPLANT
GLOVE BIOGEL PI INDICATOR 7.5 (GLOVE) ×1
GOWN STRL REUS W/ TWL LRG LVL3 (GOWN DISPOSABLE) ×2 IMPLANT
GOWN STRL REUS W/TWL LRG LVL3 (GOWN DISPOSABLE) ×2
HEMOSTAT ARISTA ABSORB 1G (HEMOSTASIS) ×2 IMPLANT
IRRIGATION STRYKERFLOW (MISCELLANEOUS) ×1 IMPLANT
IRRIGATOR STRYKERFLOW (MISCELLANEOUS) ×2
IV NS 1000ML (IV SOLUTION) ×1
IV NS 1000ML BAXH (IV SOLUTION) ×1 IMPLANT
L-HOOK LAP DISP 36CM (ELECTROSURGICAL) ×2
LHOOK LAP DISP 36CM (ELECTROSURGICAL) ×1 IMPLANT
NEEDLE HYPO 22GX1.5 SAFETY (NEEDLE) ×2 IMPLANT
NS IRRIG 500ML POUR BTL (IV SOLUTION) ×2 IMPLANT
PACK LAP CHOLECYSTECTOMY (MISCELLANEOUS) ×2 IMPLANT
PENCIL ELECTRO HAND CTR (MISCELLANEOUS) ×2 IMPLANT
POUCH SPECIMEN RETRIEVAL 10MM (ENDOMECHANICALS) ×2 IMPLANT
RELOAD 45 VASCULAR/THIN (ENDOMECHANICALS) IMPLANT
RELOAD STAPLE TA45 3.5 REG BLU (ENDOMECHANICALS) ×8 IMPLANT
SCISSORS METZENBAUM CVD 33 (INSTRUMENTS) IMPLANT
SHEARS HARMONIC ACE PLUS 36CM (ENDOMECHANICALS) ×2 IMPLANT
SLEEVE ENDOPATH XCEL 5M (ENDOMECHANICALS) ×2 IMPLANT
SPONGE LAP 18X18 RF (DISPOSABLE) ×2 IMPLANT
SUT ETHILON 3-0 FS-10 30 BLK (SUTURE) ×2
SUT MNCRL AB 4-0 PS2 18 (SUTURE) ×2 IMPLANT
SUT VICRYL 0 AB UR-6 (SUTURE) ×4 IMPLANT
SUTURE EHLN 3-0 FS-10 30 BLK (SUTURE) ×1 IMPLANT
SYR 20ML LL LF (SYRINGE) ×2 IMPLANT
TRAY FOLEY MTR SLVR 16FR STAT (SET/KITS/TRAYS/PACK) IMPLANT
TROCAR XCEL BLUNT TIP 100MML (ENDOMECHANICALS) ×2 IMPLANT
TROCAR XCEL NON-BLD 5MMX100MML (ENDOMECHANICALS) ×4 IMPLANT
TUBING EVAC SMOKE HEATED PNEUM (TUBING) ×2 IMPLANT

## 2019-04-26 NOTE — Anesthesia Postprocedure Evaluation (Signed)
Anesthesia Post Note  Patient: Veronica Barron  Procedure(s) Performed: APPENDECTOMY LAPAROSCOPIC (N/A Abdomen)  Patient location during evaluation: PACU Anesthesia Type: General Level of consciousness: awake and alert Pain management: pain level controlled Vital Signs Assessment: post-procedure vital signs reviewed and stable Respiratory status: spontaneous breathing, nonlabored ventilation, respiratory function stable and patient connected to nasal cannula oxygen Cardiovascular status: blood pressure returned to baseline and stable Postop Assessment: no apparent nausea or vomiting Anesthetic complications: no     Last Vitals:  Vitals:   04/26/19 2138 04/26/19 2200  BP: (!) 172/85 (!) 167/86  Pulse: 73 70  Resp: 14 18  Temp: 36.4 C 36.8 C  SpO2: 95% 98%    Last Pain:  Vitals:   04/26/19 2200  TempSrc: Oral  PainSc:                  Martha Clan

## 2019-04-26 NOTE — Telephone Encounter (Signed)
Tried to reach patient left  Urgent message to call office please put patient through to office.

## 2019-04-26 NOTE — Telephone Encounter (Signed)
Was able to reach patient and she is in route to [facility] ER. Called and notified ER nurse ashley patient in in route with acute appendicitis.

## 2019-04-26 NOTE — Anesthesia Procedure Notes (Signed)
Procedure Name: Intubation Date/Time: 04/26/2019 6:55 PM Performed by: Lendon Colonel, CRNA Pre-anesthesia Checklist: Patient identified, Patient being monitored, Timeout performed, Emergency Drugs available and Suction available Patient Re-evaluated:Patient Re-evaluated prior to induction Oxygen Delivery Method: Circle system utilized Preoxygenation: Pre-oxygenation with 100% oxygen Induction Type: IV induction, Rapid sequence and Cricoid Pressure applied Laryngoscope Size: 3 and McGraph Grade View: Grade I Tube type: Oral Tube size: 7.0 mm Number of attempts: 1 Airway Equipment and Method: Stylet Placement Confirmation: ETT inserted through vocal cords under direct vision,  positive ETCO2 and breath sounds checked- equal and bilateral Secured at: 20 cm Tube secured with: Tape Dental Injury: Teeth and Oropharynx as per pre-operative assessment

## 2019-04-26 NOTE — Anesthesia Post-op Follow-up Note (Signed)
Anesthesia QCDR form completed.        

## 2019-04-26 NOTE — ED Notes (Signed)
Report to [place], RN in [place].

## 2019-04-26 NOTE — H&P (Signed)
Patient ID: Veronica Barron, female   DOB: 1941-02-18, 78 y.o.   MRN: [id number]  HPI Veronica Barron is a 78 y.o. female seen at the request of Dr. Corky Downs, case discussed with him in detail.  She presents with a 4 to 5-day history of intermittent abdominal pain.  Over last 24 hours the pain has worsened now locates to the right lower quadrant is moderate to severe intensity sharp worsening with certain movements.  She has some decreased appetite but no vomiting.  No fevers no chills.  She does have a significant history of coronary artery disease status post stent placement in 2014 and is currently on Plavix and aspirin.  She is however able to perform more than 4 METS of activity without any shortness of breath or chest pain.  CT scan personally reviewed showing evidence of a marked dilated appendix measuring 14 mm with significant inflammatory changes.  No free air or abscess.  The and CMP is normal except mild hyponatremia a sugar of 177. She has her last p.o. intake about 6 hours ago  HPI  Past Medical History:  Diagnosis Date  . Allergic rhinitis, cause unspecified   . CAD (coronary artery disease)    a. 03/2013 Abnl MV, EF 29%;  b. 03/2013 Cath/PCI: >M nl, LAD 99p (2.5x28 Promus Prem DES), LCX 100 after large OM1, RCA dominant, 20p, EF 30%.  . Diverticulosis of colon   . Hyperlipidemia   . Hypertension   . Ischemic cardiomyopathy    a. 03/2013 EF 30% by LV gram.;  b. Echo (12/14): Mod LVH with severe basal septal hypertrophy w/o LVOT obstr, EF 50-55%, normal wall motion, Gr 1 DD, MAC, mild MR, mild LAE, trivial eff.  . Leiomyoma of uterus, unspecified   . Unspecified hypothyroidism     Past Surgical History:  Procedure Laterality Date  . ABDOMINAL HYSTERECTOMY     fibroid   at  age 69  . CHOLECYSTECTOMY    . LEFT HEART CATHETERIZATION WITH CORONARY ANGIOGRAM N/A 03/16/2013   Procedure: LEFT HEART CATHETERIZATION WITH CORONARY ANGIOGRAM;  Surgeon: [name] [name], MD;  Location: [facility] CATH LAB;   Service: Cardiovascular;  Laterality: N/A;  . LEFT HEART CATHETERIZATION WITH CORONARY ANGIOGRAM N/A 05/23/2014   Procedure: LEFT HEART CATHETERIZATION WITH CORONARY ANGIOGRAM;  Surgeon: [name] [name], MD;  Location: [facility] CATH LAB;  Service: Cardiovascular;  Laterality: N/A;  . PERCUTANEOUS CORONARY STENT INTERVENTION (PCI-S)  03/16/2013   Procedure: PERCUTANEOUS CORONARY STENT INTERVENTION (PCI-S);  Surgeon: [name] [name], MD;  Location: [facility] CATH LAB;  Service: Cardiovascular;;  . TUBAL LIGATION      Family History  Problem Relation Age of Onset  . Dementia Mother   . Hyperlipidemia Mother   . Hypertension Mother   . Coronary artery disease Father   . Hypertension Father   . Hyperlipidemia Father   . Uterine cancer Sister 52  . Breast cancer Maternal Aunt     Social History Social History   Tobacco Use  . Smoking status: Never Smoker  . Smokeless tobacco: Never Used  Substance Use Topics  . Alcohol use: Yes    Comment: wine  . Drug use: No    Allergies  Allergen Reactions  . Diphenhydramine Hcl     REACTION: TACHYCARDIA  . Fenofibrate Other (See Comments)    constipation  . Statins     Myalgia   . Zetia [Ezetimibe] Other (See Comments)    Myalgias     Current Facility-Administered Medications  Medication Dose Route Frequency Provider Last Rate Last Dose  . morphine 2 MG/ML injection 2 mg  2 mg Intravenous Q1H PRN Lavonia Drafts, MD      . ondansetron [facility]) injection 4 mg  4 mg Intravenous Q2H PRN Lavonia Drafts, MD      . piperacillin-tazobactam (ZOSYN) IVPB 3.375 g  3.375 g Intravenous Once Lavonia Drafts, MD 100 mL/hr at 04/26/19 1736 3.375 g at 04/26/19 1736  . sodium chloride 0.9 % bolus 1,000 mL  1,000 mL Intravenous Once , Marjory Lies, MD       Current Outpatient Medications  Medication Sig Dispense Refill  . ALFALFA PO Take 1 tablet by mouth daily.    Marland Kitchen aspirin EC 81 MG tablet Take 81 mg by mouth daily.    . carvedilol (COREG) 6.25 MG tablet TAKE 1  TABLET BY MOUTH TWICE DAILY WITH MEALS 180 tablet 3  . clopidogrel (PLAVIX) 75 MG tablet Take 1 tablet (75 mg total) by mouth daily. 90 tablet 2  . clorazepate (TRANXENE-T) 7.5 MG tablet Take 1 tablet (7.5 mg total) by mouth at bedtime as needed for anxiety. 30 tablet 0  . Evolocumab (REPATHA SURECLICK) 202 MG/ML SOAJ Inject 140 mg into the skin every 14 (fourteen) days. 2 pen 12  . levothyroxine (SYNTHROID) 75 MCG tablet TAKE 1 TABLET BY MOUTH DAILY BEFORE BREAKFAST. LABS NEEDED PRIOR TO NEXT REFILL 90 tablet 0  . lisinopril (ZESTRIL) 5 MG tablet Take 1 tablet (5 mg total) by mouth 2 (two) times daily. 180 tablet 1  . multivitamin-lutein (OCUVITE-LUTEIN) CAPS capsule Take 1 capsule by mouth daily.    . sertraline (ZOLOFT) 50 MG tablet TAKE 1 TABLET BY MOUTH DAILY 90 tablet 1  . vitamin B-12 (CYANOCOBALAMIN) 500 MCG tablet Take 500 mcg by mouth daily.    . vitamin C (ASCORBIC ACID) 500 MG tablet Take 500 mg by mouth daily.    Marland Kitchen omeprazole (PRILOSEC) 20 MG capsule Take 1 capsule (20 mg total) by mouth daily. (Patient not taking: Reported on 04/26/2019) 30 capsule 0     Review of Systems Full ROS  was asked and was negative except for the information on the HPI  Physical Exam Blood pressure (!) 165/94, pulse 76, temperature 98 F (36.7 C), resp. rate 18, height _0  (1.626 m), weight 69.4 kg, SpO2 98 %. CONSTITUTIONAL: Elderly female non toxic. EYES: Pupils are equal, round, and reactive to light, Sclera are non-icteric. EARS, NOSE, MOUTH AND THROAT: The oropharynx is clear. The oral mucosa is pink and moist. Hearing is intact to voice. LYMPH NODES:  Lymph nodes in the neck are normal. RESPIRATORY:  Lungs are clear. There is normal respiratory effort, with equal breath sounds bilaterally, and without pathologic use of accessory muscles. CARDIOVASCULAR: Heart is regular without murmurs, gallops, or rubs. GI: The abdomen is soft, TTP RLQ w focal peritonitis and rebound. Previous midline  scar. GU: Rectal deferred.   MUSCULOSKELETAL: Normal muscle strength and tone. No cyanosis or edema.   SKIN: Turgor is good and there are no pathologic skin lesions or ulcers. NEUROLOGIC: Motor and sensation is grossly normal. Cranial nerves are grossly intact. PSYCH:  Oriented to person, place and time. Affect is normal.  Data Reviewed  I have personally reviewed the patient's imaging, laboratory findings and medical records.    Assessment/Plan 78 year old female with significant coronary artery disease presents with acute appendicitis.  Her exam is concerning I am afraid without surgical management she will likely perforate.  I do not  think that this is a good case for antibiotic therapy.  She is on aspirin and Plavix and I have expressed my concerns to the patient.  There is however literature to support delaying appendectomy in the setting of antithrombotic therapy. [facility] et al . [facility] 2017 Dec). I did however explained that since she is in dual antiplatelet therapy there is views increased risk of bleeding. She is also concerned that if we stop her antiplatelet therapy she may have an adverse event from her stents.  Apparently she did have a family member that developed a stroke after anticoagulation was discontinued. I am okay with proceeding with surgery given findings on CT scan and clinical findings.  I do think that her clinical exam and CT findings are consistent with a sinusitis with significant risk for appendiceal rupture, firmly believe that she is better served with appendectomy. She discussed with the patient in detail.  Risk, benefits and possible complications including but not limited to: Bleeding, infection, injury to adjacent structures, bowel injuries. Once again since given her exam I do think that we should proceed in an urgent fashion for appendectomy tonight. We will start antibiotics and fluid resuscitation, D/W OR and anesthesiologist.   Caroleen Hamman, MD  FACS General Surgeon 04/26/2019, 5:36 PM

## 2019-04-26 NOTE — Transfer of Care (Signed)
Immediate Anesthesia Transfer of Care Note  Patient: Veronica Barron  Procedure(s) Performed: APPENDECTOMY LAPAROSCOPIC (N/A Abdomen)  Patient Location: PACU  Anesthesia Type:General  Level of Consciousness: sedated and patient cooperative  Airway & Oxygen Therapy: Patient Spontanous Breathing and Patient connected to face mask oxygen  Post-op Assessment: Report given to RN and Post -op Vital signs reviewed and stable  Post vital signs: Reviewed and stable  Last Vitals:  Vitals Value Taken Time  BP 148/81 04/26/19 2037  Temp    Pulse 72 04/26/19 2039  Resp 13 04/26/19 2039  SpO2 100 % 04/26/19 2039  Vitals shown include unvalidated device data.  Last Pain:  Vitals:   04/26/19 1800  TempSrc: Oral  PainSc:          Complications: No apparent anesthesia complications

## 2019-04-26 NOTE — ED Triage Notes (Signed)
Pt comes via POV from doctor with possible appendicitis.  Pt states right sided abdominal pain that started last Friday. Pt states she thought it was diverculosis.  Pt denies any N/V/D.  Pt states she was called and informed by her PCP to come here.

## 2019-04-26 NOTE — ED Notes (Signed)
All of belongings in bag with patient. Husband at bedside. Consent signed and on chart.

## 2019-04-26 NOTE — ED Notes (Signed)
Attempt to call pt husband, unsuccessful

## 2019-04-26 NOTE — ED Provider Notes (Signed)
[facility] Emergency Department Provider Note   ____________________________________________    I have reviewed the triage vital signs and the nursing notes.   HISTORY  Chief Complaint Abdominal Pain     HPI Veronica Barron is a 78 y.o. female with a history as noted below who presents today with complaints of right lower quadrant abdominal pain.  She reports this pain is been ongoing since Friday and has been essentially constant.  She reports it is actually somewhat better currently.  Her PCP sent her for a CT scan of her abdomen this morning and she notes that she was called because they are concerned she may have appendicitis.  She denies fevers or chills.  Does report some mild nausea.  Past Medical History:  Diagnosis Date  . Allergic rhinitis, cause unspecified   . CAD (coronary artery disease)    a. 03/2013 Abnl MV, EF 29%;  b. 03/2013 Cath/PCI: >M nl, LAD 99p (2.5x28 Promus Prem DES), LCX 100 after large OM1, RCA dominant, 20p, EF 30%.  . Diverticulosis of colon   . Hyperlipidemia   . Hypertension   . Ischemic cardiomyopathy    a. 03/2013 EF 30% by LV gram.;  b. Echo (12/14): Mod LVH with severe basal septal hypertrophy w/o LVOT obstr, EF 50-55%, normal wall motion, Gr 1 DD, MAC, mild MR, mild LAE, trivial eff.  . Leiomyoma of uterus, unspecified   . Unspecified hypothyroidism     Patient Active Problem List   Diagnosis Date Noted  . Gout attack 02/23/2018  . Impaired fasting glucose 04/27/2016  . Hyperlipidemia 05/04/2014  . Vitamin D insufficiency 03/22/2014  . Statin intolerance 03/15/2014  . Cardiomyopathy, ischemic 04/01/2013  . CAD (coronary artery disease) 04/01/2013  . Unstable angina ([place]) 03/17/2013  . Intermediate coronary syndrome ([place]) 03/15/2013  . Bronchiolitis 04/25/2012  . Encounter for preventive health examination 09/14/2011  . NEOPLASM, SKIN, UNCERTAIN BEHAVIOR 14/43/1540  . FIBROIDS, UTERUS 08/02/2007  .  Hypothyroidism 08/02/2007  . HYPERLIPIDEMIA 08/02/2007  . Essential hypertension 08/02/2007  . Nonallergic rhinitis 08/02/2007  . DIVERTICULOSIS, COLON 08/02/2007    Past Surgical History:  Procedure Laterality Date  . ABDOMINAL HYSTERECTOMY     fibroid   at  age 70  . CHOLECYSTECTOMY    . LEFT HEART CATHETERIZATION WITH CORONARY ANGIOGRAM N/A 03/16/2013   Procedure: LEFT HEART CATHETERIZATION WITH CORONARY ANGIOGRAM;  Surgeon: [name] [name], MD;  Location: [facility] CATH LAB;  Service: Cardiovascular;  Laterality: N/A;  . LEFT HEART CATHETERIZATION WITH CORONARY ANGIOGRAM N/A 05/23/2014   Procedure: LEFT HEART CATHETERIZATION WITH CORONARY ANGIOGRAM;  Surgeon: [name] [name], MD;  Location: [facility] CATH LAB;  Service: Cardiovascular;  Laterality: N/A;  . PERCUTANEOUS CORONARY STENT INTERVENTION (PCI-S)  03/16/2013   Procedure: PERCUTANEOUS CORONARY STENT INTERVENTION (PCI-S);  Surgeon: [name] [name], MD;  Location: [facility] CATH LAB;  Service: Cardiovascular;;  . TUBAL LIGATION      Prior to Admission medications   Medication Sig Start Date End Date Taking? Authorizing Provider  ALFALFA PO Take 1 tablet by mouth daily.   Yes [provider]  aspirin EC 81 MG tablet Take 81 mg by mouth daily.   Yes [provider]  carvedilol (COREG) 6.25 MG tablet TAKE 1 TABLET BY MOUTH TWICE DAILY WITH MEALS 03/16/18  Yes [name], [name], MD  clopidogrel (PLAVIX) 75 MG tablet Take 1 tablet (75 mg total) by mouth daily. 08/02/18  Yes [name], [name], MD  clorazepate (TRANXENE-T) 7.5 MG  tablet Take 1 tablet (7.5 mg total) by mouth at bedtime as needed for anxiety. 01/14/16  Yes Cook, Jayce G, DO  Evolocumab (REPATHA SURECLICK) 496 MG/ML SOAJ Inject 140 mg into the skin every 14 (fourteen) days. 02/11/19  Yes [name], [name], MD  levothyroxine (SYNTHROID) 75 MCG tablet TAKE 1 TABLET BY MOUTH DAILY BEFORE BREAKFAST. LABS NEEDED PRIOR TO NEXT REFILL 03/11/19  Yes [name], MD  lisinopril (ZESTRIL) 5 MG  tablet Take 1 tablet (5 mg total) by mouth 2 (two) times daily. 03/11/19  Yes [name], [name], MD  multivitamin-lutein [facility]) CAPS capsule Take 1 capsule by mouth daily.   Yes [provider]  sertraline (ZOLOFT) 50 MG tablet TAKE 1 TABLET BY MOUTH DAILY 01/17/19  Yes [name], MD  vitamin B-12 (CYANOCOBALAMIN) 500 MCG tablet Take 500 mcg by mouth daily.   Yes [provider]  vitamin C (ASCORBIC ACID) 500 MG tablet Take 500 mg by mouth daily.   Yes [provider]  omeprazole (PRILOSEC) 20 MG capsule Take 1 capsule (20 mg total) by mouth daily. Patient not taking: Reported on 04/26/2019 02/22/18   [name], MD     Allergies Diphenhydramine hcl, Fenofibrate, Statins, and Zetia [ezetimibe]  Family History  Problem Relation Age of Onset  . Dementia Mother   . Hyperlipidemia Mother   . Hypertension Mother   . Coronary artery disease Father   . Hypertension Father   . Hyperlipidemia Father   . Uterine cancer Sister 19  . Breast cancer Maternal Aunt     Social History Social History   Tobacco Use  . Smoking status: Never Smoker  . Smokeless tobacco: Never Used  Substance Use Topics  . Alcohol use: Yes    Comment: wine  . Drug use: No    Review of Systems  Constitutional: No fever/chills Eyes: No visual changes.  ENT: No sore throat. Cardiovascular: Denies chest pain. Respiratory: Denies shortness of breath. Gastrointestinal:  as above Genitourinary: No dysuria or hematuria Musculoskeletal: Negative for back pain. Skin: Negative for rash. Neurological: Negative for headaches or weakness   ____________________________________________   PHYSICAL EXAM:  VITAL SIGNS: ED Triage Vitals  Enc Vitals Group     BP 04/26/19 1536 (!) 165/94     Pulse Rate 04/26/19 1536 76     Resp 04/26/19 1536 18     Temp 04/26/19 1536 98 F (36.7 C)     Temp Source 04/26/19 1800 Oral     SpO2 04/26/19 1536 98 %     Weight 04/26/19 1536  69.4 kg (153 lb)     Height 04/26/19 1536 1.626 m (_0 )     Head Circumference --      Peak Flow --      Pain Score 04/26/19 1536 4     Pain Loc --      Pain Edu? --      Excl. in [place]? --     Constitutional: Alert and oriented.  ic. Nose: No congestion/rhinnorhea. Mouth/Throat: Mucous membranes are moist.    Cardiovascular: Normal rate, regular rhythm. Grossly normal heart sounds.  Good peripheral circulation. Respiratory: Normal respiratory effort.  No retractions. Lungs CTAB. Gastrointestinal: Soft but tender in the right lower quadrant.  No distention.  No CVA tenderness. Musculoskeletal:   Warm and well perfused Neurologic:  Normal speech and language. No gross focal neurologic deficits are appreciated.  Skin:  Skin is warm, dry and intact. No rash noted. Psychiatric: Mood and  affect are normal. Speech and behavior are normal.  ____________________________________________   LABS (all labs ordered are listed, but only abnormal results are displayed)  Labs Reviewed  COMPREHENSIVE METABOLIC PANEL - Abnormal; Notable for the following components:      Result Value   Sodium 132 (*)    Glucose, Bld 177 (*)    All other components within normal limits  CBC - Abnormal; Notable for the following components:   Hemoglobin 11.9 (*)    MCH 25.7 (*)    All other components within normal limits  URINALYSIS, COMPLETE (UACMP) WITH MICROSCOPIC - Abnormal; Notable for the following components:   Color, Urine STRAW (*)    APPearance CLEAR (*)    Specific Gravity, Urine 1.038 (*)    Leukocytes,Ua TRACE (*)    All other components within normal limits  SARS CORONAVIRUS 2 BY RT PCR (HOSPITAL ORDER, [place] [facility])  LIPASE, BLOOD   ____________________________________________  EKG  None ____________________________________________  RADIOLOGY  CT reviewed from this morning, 14 mm appendix consistent with acute appendicitis  ____________________________________________   PROCEDURES  Procedure(s) performed: No  Procedures   Critical Care performed: No ____________________________________________   INITIAL IMPRESSION / ASSESSMENT AND PLAN / ED COURSE  Pertinent labs & imaging results that were available during my care of the patient were reviewed by me and considered in my medical decision making (see chart for details).  Patient with acute appendicitis based on history and CT scan.  Ordered PRN IV morphine and IV Zofran.  IV Zosyn ordered as well.  After reviewing CT and labs I spoke to Dr. Dahlia Byes of general surgery who will admit the patient    ____________________________________________   FINAL CLINICAL IMPRESSION(S) / ED DIAGNOSES  Final diagnoses:  Acute appendicitis, unspecified acute appendicitis type        Note:  This document was prepared using Dragon voice recognition software and may include unintentional dictation errors.   Lavonia Drafts, MD 04/26/19 1827

## 2019-04-26 NOTE — Progress Notes (Signed)
Patient ID: Veronica Barron, female   DOB: 20-Aug-1940, 78 y.o.   MRN: [id number]    Virtual Visit via phone Note  This visit type was conducted due to national recommendations for restrictions regarding the COVID-19 pandemic (e.g. social distancing).  This format is felt to be most appropriate for this patient at this time.  All issues noted in this document were discussed and addressed.  No physical exam was performed (except for noted visual exam findings with Video Visits).   I connected with Vilma Meckel today at  8:00 AM EDT by telephone and verified that I am speaking with the correct person using two identifiers. Location patient: home Location provider: work or home office Persons participating in the virtual visit: patient, provider  I discussed the limitations, risks, security and privacy concerns of performing an evaluation and management service by telephone and the availability of in person appointments. I also discussed with the patient that there may be a patient responsible charge related to this service. The patient expressed understanding and agreed to proceed.   HPI:  Patient and I connected via telephone to discuss abdominal pain.  Patient states she has had right lower quadrant abdominal pain for 1 to 2 days.  States the right side of her abdomen is quite tender to touch.  Denies vomiting or diarrhea.  States it is a cramping feeling.  Last meal was yesterday dinnertime.  Denies fever or chills.  Patient does report a history of diverticulitis.   ROS: See pertinent positives and negatives per HPI.  Past Medical History:  Diagnosis Date  . Allergic rhinitis, cause unspecified   . CAD (coronary artery disease)    a. 03/2013 Abnl MV, EF 29%;  b. 03/2013 Cath/PCI: >M nl, LAD 99p (2.5x28 Promus Prem DES), LCX 100 after large OM1, RCA dominant, 20p, EF 30%.  . Diverticulosis of colon   . Hyperlipidemia   . Hypertension   . Ischemic cardiomyopathy    a. 03/2013 EF 30% by  LV gram.;  b. Echo (12/14): Mod LVH with severe basal septal hypertrophy w/o LVOT obstr, EF 50-55%, normal wall motion, Gr 1 DD, MAC, mild MR, mild LAE, trivial eff.  . Leiomyoma of uterus, unspecified   . Unspecified hypothyroidism     Past Surgical History:  Procedure Laterality Date  . ABDOMINAL HYSTERECTOMY     fibroid   at  age 65  . CHOLECYSTECTOMY    . LEFT HEART CATHETERIZATION WITH CORONARY ANGIOGRAM N/A 03/16/2013   Procedure: LEFT HEART CATHETERIZATION WITH CORONARY ANGIOGRAM;  Surgeon: [name] [name], MD;  Location: [facility] CATH LAB;  Service: Cardiovascular;  Laterality: N/A;  . LEFT HEART CATHETERIZATION WITH CORONARY ANGIOGRAM N/A 05/23/2014   Procedure: LEFT HEART CATHETERIZATION WITH CORONARY ANGIOGRAM;  Surgeon: [name] [name], MD;  Location: [facility] CATH LAB;  Service: Cardiovascular;  Laterality: N/A;  . PERCUTANEOUS CORONARY STENT INTERVENTION (PCI-S)  03/16/2013   Procedure: PERCUTANEOUS CORONARY STENT INTERVENTION (PCI-S);  Surgeon: [name] [name], MD;  Location: [facility] CATH LAB;  Service: Cardiovascular;;  . TUBAL LIGATION      Family History  Problem Relation Age of Onset  . Dementia Mother   . Hyperlipidemia Mother   . Hypertension Mother   . Coronary artery disease Father   . Hypertension Father   . Hyperlipidemia Father   . Uterine cancer Sister 82  . Breast cancer Maternal Aunt    Social History   Tobacco Use  . Smoking status: Never Smoker  . Smokeless tobacco: Never  Used  Substance Use Topics  . Alcohol use: Yes    Comment: wine      Current Outpatient Medications:  .  ALFALFA PO, Take 1 tablet by mouth daily., Disp: , Rfl:  .  aspirin EC 81 MG tablet, Take 81 mg by mouth daily., Disp: , Rfl:  .  carvedilol (COREG) 6.25 MG tablet, TAKE 1 TABLET BY MOUTH TWICE DAILY WITH MEALS, Disp: 180 tablet, Rfl: 3 .  clopidogrel (PLAVIX) 75 MG tablet, Take 1 tablet (75 mg total) by mouth daily., Disp: 90 tablet, Rfl: 2 .  clorazepate (TRANXENE-T) 7.5 MG tablet, Take 1  tablet (7.5 mg total) by mouth at bedtime as needed for anxiety., Disp: 30 tablet, Rfl: 0 .  Evolocumab (REPATHA SURECLICK) 308 MG/ML SOAJ, Inject 140 mg into the skin every 14 (fourteen) days., Disp: 2 pen, Rfl: 12 .  levothyroxine (SYNTHROID) 75 MCG tablet, TAKE 1 TABLET BY MOUTH DAILY BEFORE BREAKFAST. LABS NEEDED PRIOR TO NEXT REFILL, Disp: 90 tablet, Rfl: 0 .  lisinopril (ZESTRIL) 5 MG tablet, Take 1 tablet (5 mg total) by mouth 2 (two) times daily., Disp: 180 tablet, Rfl: 1 .  multivitamin-lutein (OCUVITE-LUTEIN) CAPS capsule, Take 1 capsule by mouth daily., Disp: , Rfl:  .  sertraline (ZOLOFT) 50 MG tablet, TAKE 1 TABLET BY MOUTH DAILY, Disp: 90 tablet, Rfl: 1 .  vitamin B-12 (CYANOCOBALAMIN) 500 MCG tablet, Take 500 mcg by mouth daily., Disp: , Rfl:  .  vitamin C (ASCORBIC ACID) 500 MG tablet, Take 500 mg by mouth daily., Disp: , Rfl:  .  omeprazole (PRILOSEC) 20 MG capsule, Take 1 capsule (20 mg total) by mouth daily. (Patient not taking: Reported on 04/26/2019), Disp: 30 capsule, Rfl: 0  EXAM:  GENERAL: alert, oriented, sounds in no acute distress  LUNGS: Speaking in full sentences, no signs of respiratory distress, breathing rate appears normal, no obvious gross SOB, gasping or wheezing  PSYCH/NEURO: pleasant and cooperative, no obvious depression or anxiety, speech and thought processing grossly intact  ASSESSMENT AND PLAN:  Discussed the following assessment and plan:   Advised patient due to her description of right-sided abdominal tenderness I cannot guarantee she does not have appendicitis without imaging.  We will get set up for CT of abdomen and pelvis to rule out acute appendicitis.  Advised patient to not eat or drink prior to getting CT scan on the chance that surgery may be needed.  Patient advised we will get scans set up today and someone will be calling her with directions on where to get scan completed.  Advised that if pain worsens between now and getting the CT scan  done, she should go to the emergency room right away.  1. Right lower quadrant abdominal pain  - CT Abdomen Pelvis W Contrast; Future    I discussed the assessment and treatment plan with the patient. The patient was provided an opportunity to ask questions and all were answered. The patient agreed with the plan and demonstrated an understanding of the instructions.   The patient was advised to call back or seek an in-person evaluation if the symptoms worsen or if the condition fails to improve as anticipated.  I provided 12 minutes of non-face-to-face time during this encounter.   Jodelle Green, FNP

## 2019-04-26 NOTE — Op Note (Signed)
laparascopic partial cecectomy [name] Date of operation:  04/26/2019  Indications: The patient presented with a history of  abdominal pain. Workup has revealed findings consistent with acute appendicitis.  Pre-operative Diagnosis: Acute appendicitis without mention of peritonitis  Post-operative Diagnosis: Same  Surgeon: Caroleen Hamman, MD, FACS  Anesthesia: General with endotracheal tube  Findings: Necrotic severe appendicitis w base of the appendix inflamed requiring partial cecectomy  Estimated Blood Loss: 10cc               Complications:  none  Procedure Details  The patient was seen again in the preop area. The options of surgery versus observation were reviewed with the patient and/or family. The risks of bleeding, infection, recurrence of symptoms, negative laparoscopy, potential for an open procedure, bowel injury, abscess or infection, were all reviewed as well. The patient was taken to Operating Room, identified as Veronica Barron and the procedure verified as laparoscopic appendectomy. A Time Out was held and the above information confirmed.  The patient was placed in the supine position and general anesthesia was induced.  Antibiotic prophylaxis was administered and VT E prophylaxis was in place. A Foley catheter was placed by the nursing staff.   The abdomen was prepped and draped in a sterile fashion. An infraumbilical incision was made. A cutdown technique was used to enter the abdominal cavity. Two vicryl stitches were placed on the fascia and a Hasson trocar inserted. Pneumoperitoneum obtained. Two 5 mm ports were placed under direct visualization.   The appendix was identified and found to be acutely inflamed and necrotic with significant inflammatory response. The appendix was carefully dissected. The mesoappendix was divided with Harmonic scalpel.  The base of the appendix was  inflammaed and friable , for this reason I needed to take a healthy margin of cecum,  the lateral portion of the cecum was divided with multiple blue standard load Endo GIA.The appendix was placed in a Endo Catch bag and removed via the Hasson port. The right lower quadrant and pelvis was then irrigated with  normal saline which was aspirated. Inspection  failed to identify any additional bleeding and there were no signs of bowel injury. There was raw surface and I place arista.  I placed a 15 FR blake RLQ near the staple line. Again the right lower quadrant was inspected there was no sign of bleeding or bowel injury therefore pneumoperitoneum was released, all ports were removed.  The umbilical fascia was closed with 0 Vicryl interrupted sutures and the skin incisions were approximated with subcuticular 4-0 Monocryl. Dermabond was placed The patient tolerated the procedure well, there were no complications. The sponge lap and needle count were correct at the end of the procedure.  The patient was taken to the recovery room in stable condition to be admitted for continued care.    Caroleen Hamman, MD FACS

## 2019-04-26 NOTE — Anesthesia Preprocedure Evaluation (Signed)
Anesthesia Evaluation  Patient identified by MRN, date of birth, ID band Patient awake    Reviewed: Allergy & Precautions, H&P , NPO status , Patient's Chart, lab work & pertinent test results, reviewed documented beta blocker date and time   History of Anesthesia Complications Negative for: history of anesthetic complications  Airway Mallampati: III  TM Distance: >3 FB Neck ROM: full    Dental  (+) Dental Advidsory Given, Teeth Intact, Caps   Pulmonary neg pulmonary ROS,    Pulmonary exam normal        Cardiovascular Exercise Tolerance: Good hypertension, (-) angina+ CAD, + Past MI and + Cardiac Stents  (-) CABG Normal cardiovascular exam(-) dysrhythmias (-) Valvular Problems/Murmurs     Neuro/Psych negative neurological ROS  negative psych ROS   GI/Hepatic negative GI ROS, Neg liver ROS,   Endo/Other  Hypothyroidism   Renal/GU negative Renal ROS  negative genitourinary   Musculoskeletal   Abdominal   Peds  Hematology negative hematology ROS (+)   Anesthesia Other Findings Past Medical History: No date: Allergic rhinitis, cause unspecified No date: CAD (coronary artery disease)     Comment:  a. 03/2013 Abnl MV, EF 29%;  b. 03/2013 Cath/PCI: >M nl,               LAD 99p (2.5x28 Promus Prem DES), LCX 100 after large               OM1, RCA dominant, 20p, EF 30%. No date: Diverticulosis of colon No date: Hyperlipidemia No date: Hypertension No date: Ischemic cardiomyopathy     Comment:  a. 03/2013 EF 30% by LV gram.;  b. Echo (12/14): Mod LVH               with severe basal septal hypertrophy w/o LVOT obstr, EF               50-55%, normal wall motion, Gr 1 DD, MAC, mild MR, mild               LAE, trivial eff. No date: Leiomyoma of uterus, unspecified No date: Unspecified hypothyroidism   Reproductive/Obstetrics negative OB ROS                             Anesthesia  Physical Anesthesia Plan  ASA: III  Anesthesia Plan: General   Post-op Pain Management:    Induction: Intravenous, Rapid sequence and Cricoid pressure planned  PONV Risk Score and Plan: 3 and Ondansetron, Dexamethasone and Treatment may vary due to age or medical condition  Airway Management Planned: Oral ETT  Additional Equipment:   Intra-op Plan:   Post-operative Plan: Extubation in OR  Informed Consent: I have reviewed the patients History and Physical, chart, labs and discussed the procedure including the risks, benefits and alternatives for the proposed anesthesia with the patient or authorized representative who has indicated his/her understanding and acceptance.     Dental Advisory Given  Plan Discussed with: Anesthesiologist, CRNA and Surgeon  Anesthesia Plan Comments:         Anesthesia Quick Evaluation

## 2019-04-26 NOTE — Telephone Encounter (Signed)
I called & left a detailed message stating that patient needed to go back ER ASAP for acute appendicitis. We were unsure if radiology had kept patient there. I will continue to call patient.

## 2019-04-27 ENCOUNTER — Telehealth: Payer: Self-pay | Admitting: Cardiology

## 2019-04-27 ENCOUNTER — Encounter: Payer: Self-pay | Admitting: Surgery

## 2019-04-27 LAB — CBC
HCT: 37.5 % (ref 36.0–46.0)
Hemoglobin: 11.6 g/dL — ABNORMAL LOW (ref 12.0–15.0)
MCH: 25.5 pg — ABNORMAL LOW (ref 26.0–34.0)
MCHC: 30.9 g/dL (ref 30.0–36.0)
MCV: 82.4 fL (ref 80.0–100.0)
Platelets: 349 10*3/uL (ref 150–400)
RBC: 4.55 MIL/uL (ref 3.87–5.11)
RDW: 15.1 % (ref 11.5–15.5)
WBC: 8.7 10*3/uL (ref 4.0–10.5)
nRBC: 0 % (ref 0.0–0.2)

## 2019-04-27 LAB — BASIC METABOLIC PANEL
Anion gap: 12 (ref 5–15)
BUN: 9 mg/dL (ref 8–23)
CO2: 21 mmol/L — ABNORMAL LOW (ref 22–32)
Calcium: 8.3 mg/dL — ABNORMAL LOW (ref 8.9–10.3)
Chloride: 103 mmol/L (ref 98–111)
Creatinine, Ser: 0.64 mg/dL (ref 0.44–1.00)
GFR calc Af Amer: >60 mL/min (ref >60)
GFR calc non Af Amer: >60 mL/min (ref >60)
Glucose, Bld: 107 mg/dL — ABNORMAL HIGH (ref 70–99)
Potassium: 4.1 mmol/L (ref 3.5–5.1)
Sodium: 136 mmol/L (ref 135–145)

## 2019-04-27 MED ORDER — OXYCODONE HCL 5 MG PO TABS: 5.0000 mg | ORAL_TABLET | ORAL | 0 refills | Status: DC | PRN

## 2019-04-27 MED ORDER — AMOXICILLIN-POT CLAVULANATE 875-125 MG PO TABS: 1.0000 | ORAL_TABLET | Freq: Two times a day (BID) | ORAL | 0 refills | Status: AC

## 2019-04-27 NOTE — Progress Notes (Signed)
Pt discharged per MD order. IV removed. Education on JP drain completed. Discharge instructions reviewed with pt. Pt verbalized understanding. Pt taken downstairs in wheelchair by staff.

## 2019-04-27 NOTE — Telephone Encounter (Signed)
Called and lmomed the pt stating that her pa isn't due until 06/11/19. Also instructed the pt to call back if they need a sample

## 2019-04-27 NOTE — Discharge Instructions (Signed)
In addition to included general post-operative instructions for laparoscopic appendectomy,  Diet: Resume home heart healthy diet.   Activity: No heavy lifting >20 pounds (children, pets, laundry, garbage) for 4-6 weeks, but light activity and walking are encouraged. Do not drive or drink alcohol if taking narcotic pain medications or having pain that might distract from driving.  Wound care: 2 days after surgery (10/15), you may shower/get incision wet with soapy water and pat dry (do not rub incisions), but no baths or submerging incision underwater until follow-up.   Medications: Resume all home medications. For mild to moderate pain: acetaminophen (Tylenol) or ibuprofen/naproxen (if no kidney disease). Combining Tylenol with alcohol can substantially increase your risk of causing liver disease. Narcotic pain medications, if prescribed, can be used for severe pain, though may cause nausea, constipation, and drowsiness. Do not combine Tylenol and Percocet (or similar) within a 6 hour period as Percocet (and similar) contain(s) Tylenol. If you do not need the narcotic pain medication, you do not need to fill the prescription.  Call office [phone] / [phone]) at any time if any questions, worsening pain, fevers/chills, bleeding, drainage from incision site, or other concerns.

## 2019-04-27 NOTE — Discharge Summary (Addendum)
[facility] SUR[facility]ICAL DISCHARGE SUMMARY   Patient ID: Veronica Barron MRN: [id number] DOB/AGE: 11/13/1940 79 y.o.  Admit date: 04/26/2019 Discharge date: 04/27/2019  Discharge Diagnoses Patient Active Problem List   Diagnosis Date Noted  . Appendicitis 04/26/2019   Consultants None  Procedures 04/26/2019:  Laparoscopic Appendectomy   HPI: Veronica Barron is a 78 y.o. female seen at the request of Dr. Corky Downs, case discussed with him in detail.  She presents with a 4 to 5-day history of intermittent abdominal pain.  Over last 24 hours the pain has worsened now locates to the right lower quadrant is moderate to severe intensity sharp worsening with certain movements.  She has some decreased appetite but no vomiting.  No fevers no chills.  She does have a significant history of coronary artery disease status post stent placement in 2014 and is currently on Plavix and aspirin.  She is however able to perform more than 4 METS of activity without any shortness of breath or chest pain.  CT scan personally reviewed showing evidence of a marked dilated appendix measuring 14 mm with significant inflammatory changes. No free air or abscess.  The and CMP is normal except mild hyponatremia a sugar of 177.  Hospital Course: Informed consent was obtained and documented, and patient underwent uneventful laparoscopic appendectomy (Dr Dahlia Byes, 04/27/2019).  Post-operatively, patient's pain/symptoms improved/resolved and advancement of patient's diet and ambulation were well-tolerated. The remainder of patient's hospital course was essentially unremarkable, and discharge planning was initiated accordingly with patient safely able to be discharged home with appropriate discharge instructions, antibiotics (Augmentin x7 days), pain control, and outpatient follow-up after all of her questions were answered to her expressed satisfaction.  Discharge Condition: Good   Physical Examination:   Constitutional: Well appearing female, NAD Pulmonary: Normal effort, no respiratory distress Gastrointestinal: Soft, incisional tenderness, non-distended, JP with serosanguinous drainage Skin: laparoscopic incisions are CDI with dermabond, no erythema or drainage   Allergies as of 04/27/2019      Reactions   Diphenhydramine Hcl    REACTION: TACHYCARDIA   Fenofibrate Other (See Comments)   constipation   Statins    Myalgia   Zetia [ezetimibe] Other (See Comments)   Myalgias      Medication List    TAKE these medications   ALFALFA PO Take 1 tablet by mouth daily.   amoxicillin-clavulanate 875-125 MG tablet Commonly known as: Augmentin Take 1 tablet by mouth 2 (two) times daily for 7 days.   aspirin EC 81 MG tablet Take 81 mg by mouth daily.   carvedilol 6.25 MG tablet Commonly known as: COREG TAKE 1 TABLET BY MOUTH TWICE DAILY WITH MEALS   clopidogrel 75 MG tablet Commonly known as: PLAVIX Take 1 tablet (75 mg total) by mouth daily.   clorazepate 7.5 MG tablet Commonly known as: Tranxene-T Take 1 tablet (7.5 mg total) by mouth at bedtime as needed for anxiety.   levothyroxine 75 MCG tablet Commonly known as: SYNTHROID TAKE 1 TABLET BY MOUTH DAILY BEFORE BREAKFAST. LABS NEEDED PRIOR TO NEXT REFILL   lisinopril 5 MG tablet Commonly known as: ZESTRIL Take 1 tablet (5 mg total) by mouth 2 (two) times daily.   multivitamin-lutein Caps capsule Take 1 capsule by mouth daily.   omeprazole 20 MG capsule Commonly known as: PRILOSEC Take 1 capsule (20 mg total) by mouth daily.   oxyCODONE 5 MG immediate release tablet Commonly known as: Oxy IR/ROXICODONE Take 1 tablet (5 mg total) by mouth every 4 (four) hours as  needed for severe pain.   Repatha SureClick [id number] MG/ML Soaj Generic drug: Evolocumab Inject 140 mg into the skin every 14 (fourteen) days.   sertraline 50 MG tablet Commonly known as: ZOLOFT TAKE 1 TABLET BY MOUTH DAILY   vitamin B-12 500 MCG  tablet Commonly known as: CYANOCOBALAMIN Take 500 mcg by mouth daily.   vitamin C 500 MG tablet Commonly known as: ASCORBIC ACID Take 500 mg by mouth daily.        Follow-up Information    Pabon, [place] F, MD. Schedule an appointment as soon as possible for a visit in 1 week(s).   Specialty: General Surgery Why: s/p lap appy, has drain, okay to see Thedore Mins PA if needed Contact information: [address] [place] [address] [phone]            Time spent on discharge management including discussion of hospital course, clinical condition, outpatient instructions, prescriptions, and follow up with the patient and members of the medical team: >30 minutes  -- Edison Simon , PA-C  [facility]  04/27/2019, 7:50 AM [phone] M-F: 7am - 4pm

## 2019-04-27 NOTE — Telephone Encounter (Signed)
° ° °  [employer] pre auth calling, The wrong section on prior auth form completed. Please re-do by completing the renewal section for Repatha  Ref # [id number] Phone [phone] option 5 Fax [phone]

## 2019-04-27 NOTE — Plan of Care (Signed)
  Problem: Nutrition: Goal: Adequate nutrition will be maintained Outcome: Progressing  Patient tolerating small amounts of clear liquids. If she is able to tolerate clear liquid breakfast she will likely be upgraded to regular diet

## 2019-04-28 LAB — SURGICAL PATHOLOGY

## 2019-04-29 ENCOUNTER — Telehealth: Payer: Self-pay

## 2019-04-29 NOTE — Telephone Encounter (Signed)
Called to check on patient. States she is recovering well. Followed by general surgery and declines any further follow up from PMD.

## 2019-05-03 ENCOUNTER — Other Ambulatory Visit: Payer: Self-pay

## 2019-05-03 ENCOUNTER — Encounter: Payer: Self-pay | Admitting: Physician Assistant

## 2019-05-03 ENCOUNTER — Ambulatory Visit (INDEPENDENT_AMBULATORY_CARE_PROVIDER_SITE_OTHER): Payer: Self-pay | Admitting: Physician Assistant

## 2019-05-03 VITALS — BP 118/72 | HR 67 | Temp 97.7°F | Resp 14 | Ht 64.0 in | Wt 150.6 lb

## 2019-05-03 DIAGNOSIS — K37 Unspecified appendicitis: Secondary | ICD-10-CM

## 2019-05-03 DIAGNOSIS — Z09 Encounter for follow-up examination after completed treatment for conditions other than malignant neoplasm: Secondary | ICD-10-CM

## 2019-05-03 NOTE — Progress Notes (Signed)
[facility] POST-OP OFFICE VISIT  05/03/2019  HPI: Veronica Barron is a 78 y.o. female 7 days s/p laparoscopic appendectomy with Dr Dahlia Byes.   Today, she reports that she has been doing well. No issues with abdominal pain, nausea, or emesis. No fever,. Chills., tolerating PO. No issues with bowel function. JP with minimal output <20 ccs a day and is serosanguinous.   Vital signs: BP 118/72   Pulse 67   Temp 97.7 F (36.5 C) (Temporal)   Resp 14   Ht 5\' 4"  (1.626 m)   Wt 150 lb 9.6 oz (68.3 kg)   SpO2 92%   BMI 25.85 kg/m    Physical Exam: Constitutional: Well appearing female, NAD Abdomen: Soft, non-tender, non-distended, JP in suprapubic incision with suprapubic output - removed Skin: Laparoscopic incisions are CDI, no erythema or drainage  Assessment/Plan: This is a 78 y.o. female 7 days s/p laparoscopic appendectomy  - Pain control prn  - Complete ABx  - JP removed in clinic; dressing placed  - Complete lifting restrictions  - Reviewed Pathology: Acute transmural appendicitis  - rtc in 2 weeks  -- Edison Simon, PA-C [facility] 05/03/2019, 10:56 AM [phone] M-F: 7am - 4pm

## 2019-05-03 NOTE — Patient Instructions (Addendum)
Please call our office if you have questions or concerns. Please keep a dry dressing over the drain site until it heals completely. You may shower tomorrow.  GENERAL POST-OPERATIVE PATIENT INSTRUCTIONS   WOUND CARE INSTRUCTIONS:  Keep a dry clean dressing on the wound if there is drainage. The initial bandage may be removed after 24 hours.  Once the wound has quit draining you may leave it open to air.  If clothing rubs against the wound or causes irritation and the wound is not draining you may cover it with a dry dressing during the daytime.  Try to keep the wound dry and avoid ointments on the wound unless directed to do so.  If the wound becomes bright red and painful or starts to drain infected material that is not clear, please contact your physician immediately.  If the wound is mildly pink and has a thick firm ridge underneath it, this is normal, and is referred to as a healing ridge.  This will resolve over the next 4-6 weeks.  BATHING: You may shower if you have been informed of this by your surgeon. However, Please do not submerge in a tub, hot tub, or pool until incisions are completely sealed or have been told by your surgeon that you may do so.  DIET:  You may eat any foods that you can tolerate.  It is a good idea to eat a high fiber diet and take in plenty of fluids to prevent constipation.  If you do become constipated you may want to take a mild laxative or take ducolax tablets on a daily basis until your bowel habits are regular.  Constipation can be very uncomfortable, along with straining, after recent surgery.  ACTIVITY:  You are encouraged to cough and deep breath or use your incentive spirometer if you were given one, every 15-30 minutes when awake.  This will help prevent respiratory complications and low grade fevers post-operatively if you had a general anesthetic.  You may want to hug a pillow when coughing and sneezing to add additional support to the surgical area, if you had  abdominal or chest surgery, which will decrease pain during these times.  You are encouraged to walk and engage in light activity for the next two weeks.  You should not lift more than 20 pounds, until 06/07/2019 as it could put you at increased risk for complications.  Twenty pounds is roughly equivalent to a plastic bag of groceries. At that time- Listen to your body when lifting, if you have pain when lifting, stop and then try again in a few days. Soreness after doing exercises or activities of daily living is normal as you get back in to your normal routine.  MEDICATIONS:  Try to take narcotic medications and anti-inflammatory medications, such as tylenol, ibuprofen, naprosyn, etc., with food.  This will minimize stomach upset from the medication.  Should you develop nausea and vomiting from the pain medication, or develop a rash, please discontinue the medication and contact your physician.  You should not drive, make important decisions, or operate machinery when taking narcotic pain medication.  SUNBLOCK Use sun block to incision area over the next year if this area will be exposed to sun. This helps decrease scarring and will allow you avoid a permanent darkened area over your incision.  QUESTIONS:  Please feel free to call our office if you have any questions, and we will be glad to assist you. [phone]

## 2019-05-04 ENCOUNTER — Other Ambulatory Visit: Payer: Self-pay | Admitting: Cardiology

## 2019-05-04 ENCOUNTER — Encounter: Payer: Self-pay | Admitting: Physician Assistant

## 2019-05-09 ENCOUNTER — Other Ambulatory Visit: Payer: Self-pay | Admitting: Cardiology

## 2019-05-19 ENCOUNTER — Encounter: Payer: Self-pay | Admitting: Physician Assistant

## 2019-05-19 ENCOUNTER — Ambulatory Visit (INDEPENDENT_AMBULATORY_CARE_PROVIDER_SITE_OTHER): Payer: Self-pay | Admitting: Physician Assistant

## 2019-05-19 ENCOUNTER — Other Ambulatory Visit: Payer: Self-pay

## 2019-05-19 VITALS — BP 124/70 | HR 72 | Temp 97.9°F | Resp 14 | Ht 63.0 in | Wt 151.2 lb

## 2019-05-19 DIAGNOSIS — Z09 Encounter for follow-up examination after completed treatment for conditions other than malignant neoplasm: Secondary | ICD-10-CM

## 2019-05-19 DIAGNOSIS — K37 Unspecified appendicitis: Secondary | ICD-10-CM

## 2019-05-19 NOTE — Progress Notes (Signed)
[facility] POST-OP OFFICE VISIT  05/19/2019  HPI: Veronica Barron is a 78 y.o. female 3 weeks s/p laparoscopic appendectomy with Dr Dahlia Byes.   Today, she reports that she is doing very well. No issues with fever, chills, pain, nausea, or emesis. Tolerating PO. Normal bowel function. No other complaints.    Vital signs: BP 124/70   Pulse 72   Temp 97.9 F (36.6 C) (Temporal)   Resp 14   Ht 5\' 3"  (1.6 m)   Wt 151 lb 3.2 oz (68.6 kg)   SpO2 98%   BMI 26.78 kg/m    Physical Exam: Constitutional: Well appearing female, NAD Abdomen: Soft, non-tender, non-distended, no rebound/guarding Skin: Laparoscopic incisions are CDI, no erythema or drainage  Assessment/Plan: This is a 78 y.o. female 3 weeks s/p laparoscopic appendectomy   - Pain control prn  - Okay to submerge wounds  - complete 1 more week of lifting restrictions  - rtc prn  -- Edison Simon, PA-C  [facility] 05/19/2019, 9:40 AM [phone] M-F: 7am - 4pm

## 2019-05-19 NOTE — Patient Instructions (Signed)
Follow-up with our office as needed.  Please call and ask to speak with a nurse if you develop questions or concerns.    Laparoscopic Appendectomy, Adult, Care After This sheet gives you information about how to care for yourself after your procedure. Your doctor may also give you more specific instructions. If you have problems or questions, contact your doctor. What can I expect after the procedure? After the procedure, it is common to have:  Little energy for normal activities.  Mild pain in the area where the cuts from surgery (incisions) were made.  Trouble pooping (constipation). This can be caused by: ? Pain medicine. ? A lack of activity. Follow these instructions at home: Medicines  Take over-the-counter and prescription medicines only as told by your doctor.  If you were prescribed an antibiotic medicine, take it as told by your doctor. Do not stop taking it even if you start to feel better.  Do not drive or use heavy machinery while taking prescription pain medicine.  Ask your doctor if the medicine you are taking can cause trouble pooping. You may need to take steps to prevent or treat trouble pooping: ? Drink enough fluid to keep your pee (urine) pale yellow. ? Take over-the-counter or prescription medicines. ? Eat foods that are high in fiber. These include beans, whole grains, and fresh fruits and vegetables. ? Limit foods that are high in fat and sugar. These include fried or sweet foods. Incision care   Follow instructions from your doctor about how to take care of your cuts from surgery. Make sure you: ? Wash your hands with soap and water before and after you change your bandage (dressing). If you cannot use soap and water, use hand sanitizer. ? Change your bandage as told by your doctor. ? Leave stitches (sutures), skin glue, or skin tape (adhesive) strips in place. They may need to stay in place for 2 weeks or longer. If tape strips get loose and curl up,  you may trim the loose edges. Do not remove tape strips completely unless your doctor says it is okay.  Check your cuts from surgery every day for signs of infection. Check for: ? Redness, swelling, or pain. ? Fluid or blood. ? Warmth. ? Pus or a bad smell. Bathing  Keep your cuts from surgery clean and dry. Clean them as told by your doctor. To do this: 1. Gently wash the cuts with soap and water. 2. Rinse the cuts with water to remove all soap. 3. Pat the cuts dry with a clean towel. Do not rub the cuts.  Do not take baths, swim, or use a hot tub for 2 weeks, or until your doctor says it is okay. You may take showers after 48 hours. Activity   Do not drive for 24 hours if you were given a medicine to help you relax (sedative) during your procedure.  Rest after the procedure. Return to your normal activities as told by your doctor. Ask your doctor what activities are safe for you.  For 3 weeks, or for as long as told by your doctor: ? Do not lift anything that is heavier than 10 lb (4.5 kg), or the limit that you are told. ? Do not play contact sports. General instructions  If you were sent home with a drain, follow instructions from your doctor on how to care for it.  Take deep breaths. This helps to keep your lungs from getting an infection (pneumonia).  Keep all follow-up visits  as told by your doctor. This is important. Contact a doctor if:  You have redness, swelling, or pain around a cut from surgery.  You have fluid or blood coming from a cut.  Your cut feels warm to the touch.  You have pus or a bad smell coming from a cut or a bandage.  The edges of a cut break open after the stitches have been taken out.  You have pain in your shoulders that gets worse.  You feel dizzy or you pass out (faint).  You have shortness of breath.  You keep feeling sick to your stomach (nauseous).  You keep throwing up (vomiting).  You get watery poop (diarrhea) or you cannot  control your poop.  You lose your appetite.  You have swelling or pain in your legs.  You get a rash. Get help right away if:  You have a fever.  You have trouble breathing.  You have sharp pains in your chest. Summary  After the procedure, it is common to have low energy, mild pain, and trouble pooping.  Infection is a common problem after this procedure. Follow your doctor's instructions about caring for yourself after the procedure.  Rest after the procedure. Return to your normal activities as told by your doctor.  Contact your doctor if you see signs of infection around your cuts from surgery, or you get short of breath. Get help right away if you have a fever, chest pain, or trouble breathing. This information is not intended to replace advice given to you by your health care provider. Make sure you discuss any questions you have with your health care provider. Document Released: 04/26/2009 Document Revised: 12/31/2017 Document Reviewed: 12/31/2017 Elsevier Patient Education  2020 [employer].

## 2019-05-30 ENCOUNTER — Ambulatory Visit (INDEPENDENT_AMBULATORY_CARE_PROVIDER_SITE_OTHER): Payer: Medicare Other | Admitting: Internal Medicine

## 2019-05-30 ENCOUNTER — Other Ambulatory Visit: Payer: Self-pay

## 2019-05-30 ENCOUNTER — Ambulatory Visit (INDEPENDENT_AMBULATORY_CARE_PROVIDER_SITE_OTHER): Payer: Medicare Other

## 2019-05-30 VITALS — BP 124/70 | HR 67 | Ht 63.0 in | Wt 151.0 lb

## 2019-05-30 DIAGNOSIS — G629 Polyneuropathy, unspecified: Secondary | ICD-10-CM | POA: Diagnosis not present

## 2019-05-30 DIAGNOSIS — K5730 Diverticulosis of large intestine without perforation or abscess without bleeding: Secondary | ICD-10-CM

## 2019-05-30 DIAGNOSIS — E7800 Pure hypercholesterolemia, unspecified: Secondary | ICD-10-CM

## 2019-05-30 DIAGNOSIS — Z9049 Acquired absence of other specified parts of digestive tract: Secondary | ICD-10-CM

## 2019-05-30 DIAGNOSIS — E034 Atrophy of thyroid (acquired): Secondary | ICD-10-CM

## 2019-05-30 DIAGNOSIS — Z0000 Encounter for general adult medical examination without abnormal findings: Secondary | ICD-10-CM | POA: Diagnosis not present

## 2019-05-30 DIAGNOSIS — R7303 Prediabetes: Secondary | ICD-10-CM | POA: Diagnosis not present

## 2019-05-30 NOTE — Assessment & Plan Note (Signed)
Uneventful post op course  Encouraged to resume exercising

## 2019-05-30 NOTE — Assessment & Plan Note (Signed)
Has been tolerating Repatha.  Repeat lipids ordered

## 2019-05-30 NOTE — Assessment & Plan Note (Signed)
Severe sigmoid by 2016 colonoscopy.  Denies constipation

## 2019-05-30 NOTE — Progress Notes (Addendum)
Subjective:   Veronica Barron is a 78 y.o. female who presents for Medicare Annual (Subsequent) preventive examination.  Review of Systems:  No ROS.  Medicare Wellness Virtual Visit.  Visual/audio telehealth visit, UTA vital signs.   See social history for additional risk factors.   Cardiac Risk Factors include: advanced age (>75mn, >>84women);hypertension     Objective:     Vitals: There were no vitals taken for this visit.  There is no height or weight on file to calculate BMI.  Advanced Directives 05/30/2019 04/27/2019 04/26/2019 05/26/2018 05/25/2017 05/19/2016 12/01/2014  Does Patient Have a Medical Advance Directive? Yes - No Yes Yes Yes Yes  Type of Advance Directive Living will;Healthcare Power of ASt. StephenLiving will HCrows NestLiving will [place]AmadorLiving will -  Does patient want to make changes to medical advance directive? No - Patient declined - - No - Patient declined No - Patient declined - -  Copy of [place]Har[place]in Chart? Yes - validated most recent copy scanned in chart (See row information) - - Yes - validated most recent copy scanned in chart (See row information) Yes No - copy requested -  Would patient like information on creating a medical advance directive? - No - Patient declined - - - - -    Tobacco Social History   Tobacco Use  Smoking Status Never Smoker  Smokeless Tobacco Never Used     Counseling given: Not Answered   Clinical Intake:  Pre-visit preparation completed: Yes        Diabetes: No  How often do you need to have someone help you when you read instructions, pamphlets, or other written materials from your doctor or pharmacy?: 1 - Never  Interpreter Needed?: No     Past Medical History:  Diagnosis Date  . Allergic rhinitis, cause unspecified   . CAD (coronary artery disease)    a. 03/2013 Abnl MV, EF 29%;  b. 03/2013 Cath/PCI: >M nl, LAD  99p (2.5x28 Promus Prem DES), LCX 100 after large OM1, RCA dominant, 20p, EF 30%.  . Diverticulosis of colon   . Hyperlipidemia   . Hypertension   . Ischemic cardiomyopathy    a. 03/2013 EF 30% by LV gram.;  b. Echo (12/14): Mod LVH with severe basal septal hypertrophy w/o LVOT obstr, EF 50-55%, normal wall motion, Gr 1 DD, MAC, mild MR, mild LAE, trivial eff.  . Leiomyoma of uterus, unspecified   . Unspecified hypothyroidism    Past Surgical History:  Procedure Laterality Date  . ABDOMINAL HYSTERECTOMY     fibroid   at  age 78 . CHOLECYSTECTOMY    . LAPAROSCOPIC APPENDECTOMY N/A 04/26/2019   Procedure: APPENDECTOMY LAPAROSCOPIC;  Surgeon: PJules Husbands MD;  Location: [facility] ORS;  Service: General;  Laterality: N/A;  . LEFT HEART CATHETERIZATION WITH CORONARY ANGIOGRAM N/A 03/16/2013   Procedure: LEFT HEART CATHETERIZATION WITH CORONARY ANGIOGRAM;  Surgeon: [name] JMartinique MD;  [facility];  Service: Cardiovascular;  Laterality: N/A;  . LEFT HEART CATHETERIZATION WITH CORONARY ANGIOGRAM N/A 05/23/2014   Procedure: LEFT HEART CATHETERIZATION WITH CORONARY ANGIOGRAM;  Surgeon: [name] JMartinique MD;  [facility];  Service: Cardiovascular;  Laterality: N/A;  . PERCUTANEOUS CORONARY STENT INTERVENTION (PCI-S)  03/16/2013   Procedure: PERCUTANEOUS CORONARY STENT INTERVENTION (PCI-S);  Surgeon: [name] JMartinique MD;  [facility];  Service: Cardiovascular;;  . TUBAL LIGATION  Family History  Problem Relation Age of Onset  . Dementia Mother   . Hyperlipidemia Mother   . Hypertension Mother   . Coronary artery disease Father   . Hypertension Father   . Hyperlipidemia Father   . Uterine cancer Sister 4  . Breast cancer Maternal Aunt    Social History   Socioeconomic History  . Marital status: Married    Spouse name: Not on file  . Number of children: 3  . Years of education: Not on file  . Highest education level: Not on file  Occupational History     Employer: RETIRED  Social Needs  . Financial resource strain: Not hard at all  . Food insecurity    Worry: Never true    Inability: Never true  . Transportation needs    Medical: No    Non-medical: No  Tobacco Use  . Smoking status: Never Smoker  . Smokeless tobacco: Never Used  Substance and Sexual Activity  . Alcohol use: Yes    Comment: wine  . Drug use: No  . Sexual activity: Not Currently    Birth control/protection: Post-menopausal  Lifestyle  . Physical activity    Days per week: 7 days    Minutes per session: 30 min  . Stress: Not at all  Relationships  . Social Herbalist on phone: Not on file    Gets together: Not on file    Attends religious service: Not on file    Active member of club or organization: Not on file    Attends meetings of clubs or organizations: Not on file    Relationship status: Married  Other Topics Concern  . 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    Outpatient Encounter Medications as of 05/30/2019  Medication Sig  . ALFALFA PO Take 1 tablet by mouth daily.  Marland Kitchen aspirin EC 81 MG tablet Take 81 mg by mouth daily.  . carvedilol (COREG) 6.25 MG tablet TAKE ONE TABLET BY MOUTH TWICE DAILY WITH MEALS  . clopidogrel (PLAVIX) 75 MG tablet TAKE 1 TABLET BY MOUTH DAILY. GENERIC EQUIVALENT FOR PLAVIX  . clorazepate (TRANXENE-T) 7.5 MG tablet Take 1 tablet (7.5 mg total) by mouth at bedtime as needed for anxiety.  . Evolocumab (REPATHA SURECLICK) 026 MG/ML SOAJ Inject 140 mg into the skin every 14 (fourteen) days.  Marland Kitchen levothyroxine (SYNTHROID) 75 MCG tablet TAKE 1 TABLET BY MOUTH DAILY BEFORE BREAKFAST. LABS NEEDED PRIOR TO NEXT REFILL  . lisinopril (ZESTRIL) 5 MG tablet Take 1 tablet (5 mg total) by mouth 2 (two) times daily.  . multivitamin-lutein (OCUVITE-LUTEIN) CAPS capsule Take 1 capsule by mouth daily.  .  sertraline (ZOLOFT) 50 MG tablet TAKE 1 TABLET BY MOUTH DAILY  . vitamin B-12 (CYANOCOBALAMIN) 500 MCG tablet Take 500 mcg by mouth daily.  . vitamin C (ASCORBIC ACID) 500 MG tablet Take 500 mg by mouth daily.   No facility-administered encounter medications on file as of 05/30/2019.     Activities of Daily Living In your present state of health, do you have any difficulty performing the following activities: 05/30/2019 04/27/2019  Hearing? N N  Vision? N N  Difficulty concentrating or making decisions? N N  Walking or climbing stairs? N N  Dressing or bathing? N N  Doing errands, shopping? N N  Preparing Food and eating ? N -  Using the Toilet? N -  In the past six months, have you accidently leaked urine? N -  Do you have problems with loss of bowel control? N -  Managing your Medications? N -  Managing your Finances? N -  Housekeeping or managing your Housekeeping? N -  Some recent data might be hidden    Patient Care Team: [name], MD as PCP - General (Internal Medicine) [name], [name], MD as PCP - Cardiology (Cardiology) Judeth Horn, MD (General Surgery) Clent Jacks, MD (Ophthalmology)    Assessment:   This is a routine wellness examination for Veronica Barron.  Nurse connected with patient 05/30/19 at 10:00 AM EST by a telephone enabled telemedicine application and verified that I am speaking with the correct person using two identifiers. Patient stated full name and DOB. Patient gave permission to continue with virtual visit. Patient's location was at home and Nurse's location was at [place] office.   Health Maintenance Due: -Influenza vaccine 2020- discussed; to be completed in season with doctor or local pharmacy.   Update all pending maintenance due as appropriate.   See completed HM at the end of note.   Eye: Visual acuity not assessed. Virtual visit. Wears corrective lenses. Followed by their ophthalmologist every 12 months.   Dental: Visits every 6 months.     Hearing: Demonstrates normal hearing during visit.  Safety:  Patient feels safe at home- yes Patient does have smoke detectors at home- yes Patient does wear sunscreen or protective clothing when in direct sunlight - yes Patient does wear seat belt when in a moving vehicle - yes Patient drives- yes Adequate lighting in walkways free from debris- yes Grab bars and handrails used as appropriate- yes Ambulates with no assistive device Cell phone on person when ambulating outside of the home- yes  Social: Alcohol intake - yes      Smoking history- never   Smokers in home? none Illicit drug use? none  Depression: PHQ 2 &9 complete. See screening below. Denies irritability, anhedonia, sadness/tearfullness.    Falls: See screening below.    Medication: Taking as directed and without issues.   Covid-19: Precautions and sickness symptoms discussed. Wears mask, social distancing, hand hygiene as appropriate.   Activities of Daily Living Patient denies needing assistance with: household chores, feeding themselves, getting from bed to chair, getting to the toilet, bathing/showering, dressing, managing money, or preparing meals.   Memory: Patient is alert. Patient denies difficulty focusing or concentrating. Correctly identified the president of the [place], season and recall. Patient likes to read, sew, craft and works Engineering geologist arrangements for brain stimulation.   BMI- discussed the importance of a healthy diet, water intake and the benefits of aerobic exercise.  Educational material provided.  Physical activity- walking, yard work, no routine  Diet:  Vegetables, fruits; low cholesterol Water: good intake  Other Providers Patient Care Team: [name], MD as PCP - General (Internal Medicine) [name], [name], MD as PCP - Cardiology (Cardiology) Judeth Horn, MD (General Surgery) Clent Jacks, MD (Ophthalmology)  Exercise Activities and Dietary recommendations  Current Exercise Habits: Home exercise routine, Intensity: Mild  Goals    . Maintain Healthy Lifestyle     Healthy diet Exercise        Fall Risk Fall Risk  05/30/2019 05/19/2019 05/03/2019 05/26/2018 05/25/2017  Falls in the past year? 0 0 0 0 No  Number falls in past yr: 0 0 - - -  Injury with Fall? - 0 - - -  Follow up Falls prevention discussed - - - -   Timed Get Up and Go performed: no, virtual visit  Depression Screen PHQ 2/9 Scores 05/30/2019 05/26/2018 05/25/2017 02/02/2017  PHQ - 2 Score 0 0 0 0  PHQ- 9 Score - - 0 -     Cognitive Function MMSE - Mini Mental State Exam 05/19/2016  Orientation to time 5  Orientation to Place 5  Registration 3  Attention/ Calculation 5  Recall 3  Language- name 2 objects 2  Language- repeat 1  Language- follow 3 step command 3  Language- read & follow direction 1  Write a sentence 1  Copy design 1  Total score 30     6CIT Screen 05/26/2018 05/25/2017  What Year? 0 points 0 points  What month? 0 points 0 points  What time? 0 points 0 points  Count back from 20 0 points 0 points  Months in reverse 0 points 0 points  Repeat phrase 0 points 0 points  Total Score 0 0    Immunization History  Administered Date(s) Administered  . Influenza Whole 07/14/2007, 05/15/2009, 06/13/2010  . Influenza, High Dose Seasonal PF 04/29/2017, 05/26/2018  . Influenza,inj,Quad PF,6+ Mos 03/09/2013, 04/04/2014, 04/24/2015, 04/25/2016  . Influenza-Unspecified 05/19/2011  . Pneumococcal Conjugate-13 03/15/2014  . Pneumococcal Polysaccharide-23 05/25/2017  . Tetanus 03/09/2013  . Zoster 11/01/2007   Screening Tests Health Maintenance  Topic Date Due  . INFLUENZA VACCINE  10/12/2019 (Originally 02/12/2019)  . MAMMOGRAM  03/03/2021  . TETANUS/TDAP  03/10/2023  . DEXA SCAN  Completed  . PNA vac Low Risk Adult  Completed      Plan:   Keep all routine maintenance appointments.   Follow up with your doctor today at 10:30  Medicare  Attestation I have personally reviewed: The patient's medical and social history Their use of alcohol, tobacco or illicit drugs Their current medications and supplements The patient's functional ability including ADLs,fall risks, home safety risks, cognitive, and hearing and visual impairment Diet and physical activities Evidence for depression   In addition, I have reviewed and discussed with patient certain preventive protocols, quality metrics, and best practice recommendations. A written personalized care plan for preventive services as well as general preventive health recommendations were provided to patient via mail.     OBrien-Blaney, Denisa L, LPN  27/12/2374     I have reviewed the above information and agree with above.   Deborra Medina, MD

## 2019-05-30 NOTE — Assessment & Plan Note (Signed)
Her  random and fasting glucoses have not been  diagnosticbut her A1c suggests she is at risk for developing diabetes.  I recommend she follow a low glycemic index diet and particpate regularly in an aerobic  exercise activity.  We should check an A1c in January.  Lab Results  Component Value Date   HGBA1C 6.4 01/07/2019

## 2019-05-30 NOTE — Assessment & Plan Note (Signed)
She describes chronic burning sensation in both feet at night,  Since her statin trial over a year ago.  Topical creams helping  Screening for reversible conditions ordered

## 2019-05-30 NOTE — Patient Instructions (Addendum)
  Ms. Truglio , Thank you for taking time to come for your Medicare Wellness Visit. I appreciate your ongoing commitment to your health goals. Please review the following plan we discussed and let me know if I can assist you in the future.   These are the goals we discussed: Goals    . Maintain Healthy Lifestyle     Healthy diet Exercise        This is a list of the screening recommended for you and due dates:  Health Maintenance  Topic Date Due  . Flu Shot  10/12/2019*  . Mammogram  03/03/2021  . Tetanus Vaccine  03/10/2023  . DEXA scan (bone density measurement)  Completed  . Pneumonia vaccines  Completed  *Topic was postponed. The date shown is not the original due date.

## 2019-05-30 NOTE — Progress Notes (Signed)
Telephone  Note  This visit type was conducted due to national recommendations for restrictions regarding the COVID-19 pandemic (e.g. social distancing).  This format is felt to be most appropriate for this patient at this time.  All issues noted in this document were discussed and addressed.  No physical exam was performed (except for noted visual exam findings with Video Visits).   I connected with@ on 05/30/19 at 10:30 AM EST by telephone and verified that I am speaking with the correct person using two identifiers. Location patient: home Location provider: work or home office Persons participating in the virtual visit: patient, provider  I discussed the limitations, risks, security and privacy concerns of performing an evaluation and management service by telephone and the availability of in person appointments. I also discussed with the patient that there may be a patient responsible charge related to this service. The patient expressed understanding and agreed to proceed.  Reason for visit: follow up on hypertension ,  hypothyroidism   HPI: 78 yr old female with history of hypertension, hypothyroidism,   IPG.   S/p lap appy on  Oct 13 by Dr Dahlia Byes . Was admitted to [facility] on Oct 13  with acute appendicitis after 3 days of RLQ pain she attributed to diverticulitis. .  Had uneventful course but was sent home following day and had difficult time getting out of bed ,  Husband helped a lot. post op check was normal on Nov 5 .  Sill tender to palpation over surgical site,  Denies feversl  Nausea, diarrhea and constipation   Hyperlipidemia:  Repatha approved due to statin intolerance and known CAD prescribed by her  cardiologist Peter [name] . Pre treatment LDL was 153 in June.  Stopped it during the recovery  And has resumed it .  No side effects.   Prediabetes:  Not exercising regularly since surgery.   Burning sensation in both feet,  Started during statin trial . Using topical creams ( Blue Emu  and "stop pain")   ROS: See pertinent positives and negatives per HPI.  Past Medical History:  Diagnosis Date  . Allergic rhinitis, cause unspecified   . CAD (coronary artery disease)    a. 03/2013 Abnl MV, EF 29%;  b. 03/2013 Cath/PCI: >M nl, LAD 99p (2.5x28 Promus Prem DES), LCX 100 after large OM1, RCA dominant, 20p, EF 30%.  . Diverticulosis of colon   . Hyperlipidemia   . Hypertension   . Ischemic cardiomyopathy    a. 03/2013 EF 30% by LV gram.;  b. Echo (12/14): Mod LVH with severe basal septal hypertrophy w/o LVOT obstr, EF 50-55%, normal wall motion, Gr 1 DD, MAC, mild MR, mild LAE, trivial eff.  . Leiomyoma of uterus, unspecified   . Unspecified hypothyroidism     Past Surgical History:  Procedure Laterality Date  . ABDOMINAL HYSTERECTOMY     fibroid   at  age 31  . CHOLECYSTECTOMY    . LAPAROSCOPIC APPENDECTOMY N/A 04/26/2019   Procedure: APPENDECTOMY LAPAROSCOPIC;  Surgeon: Jules Husbands, MD;  Location: [facility] ORS;  Service: General;  Laterality: N/A;  . LEFT HEART CATHETERIZATION WITH CORONARY ANGIOGRAM N/A 03/16/2013   Procedure: LEFT HEART CATHETERIZATION WITH CORONARY ANGIOGRAM;  Surgeon: [name] [name], MD;  Location: [facility] CATH LAB;  Service: Cardiovascular;  Laterality: N/A;  . LEFT HEART CATHETERIZATION WITH CORONARY ANGIOGRAM N/A 05/23/2014   Procedure: LEFT HEART CATHETERIZATION WITH CORONARY ANGIOGRAM;  Surgeon: [name] [name], MD;  Location: [facility] CATH LAB;  Service: Cardiovascular;  Laterality: N/A;  . PERCUTANEOUS CORONARY STENT INTERVENTION (PCI-S)  03/16/2013   Procedure: PERCUTANEOUS CORONARY STENT INTERVENTION (PCI-S);  Surgeon: [name] [name], MD;  Location: [facility] CATH LAB;  Service: Cardiovascular;;  . TUBAL LIGATION      Family History  Problem Relation Age of Onset  . Dementia Mother   . Hyperlipidemia Mother   . Hypertension Mother   . Coronary artery disease Father   . Hypertension Father   . Hyperlipidemia Father   . Uterine cancer Sister 40  . Breast  cancer Maternal Aunt     SOCIAL HX:  reports that she has never smoked. She has never used smokeless tobacco. She reports current alcohol use. She reports that she does not use drugs.   Current Outpatient Medications:  .  ALFALFA PO, Take 1 tablet by mouth daily., Disp: , Rfl:  .  aspirin EC 81 MG tablet, Take 81 mg by mouth daily., Disp: , Rfl:  .  carvedilol (COREG) 6.25 MG tablet, TAKE ONE TABLET BY MOUTH TWICE DAILY WITH MEALS, Disp: 180 tablet, Rfl: 3 .  clopidogrel (PLAVIX) 75 MG tablet, TAKE 1 TABLET BY MOUTH DAILY. GENERIC EQUIVALENT FOR PLAVIX, Disp: 90 tablet, Rfl: 2 .  clorazepate (TRANXENE-T) 7.5 MG tablet, Take 1 tablet (7.5 mg total) by mouth at bedtime as needed for anxiety., Disp: 30 tablet, Rfl: 0 .  Evolocumab (REPATHA SURECLICK) 001 MG/ML SOAJ, Inject 140 mg into the skin every 14 (fourteen) days., Disp: 2 pen, Rfl: 12 .  levothyroxine (SYNTHROID) 75 MCG tablet, TAKE 1 TABLET BY MOUTH DAILY BEFORE BREAKFAST. LABS NEEDED PRIOR TO NEXT REFILL, Disp: 90 tablet, Rfl: 0 .  lisinopril (ZESTRIL) 5 MG tablet, Take 1 tablet (5 mg total) by mouth 2 (two) times daily., Disp: 180 tablet, Rfl: 1 .  multivitamin-lutein (OCUVITE-LUTEIN) CAPS capsule, Take 1 capsule by mouth daily., Disp: , Rfl:  .  sertraline (ZOLOFT) 50 MG tablet, TAKE 1 TABLET BY MOUTH DAILY, Disp: 90 tablet, Rfl: 1 .  vitamin B-12 (CYANOCOBALAMIN) 500 MCG tablet, Take 500 mcg by mouth daily., Disp: , Rfl:  .  vitamin C (ASCORBIC ACID) 500 MG tablet, Take 500 mg by mouth daily., Disp: , Rfl:   EXAM:  VITALS per patient if applicable:  GENERAL: alert, oriented, appears well and in no acute distress  HEENT: atraumatic, conjunttiva clear, no obvious abnormalities on inspection of external nose and ears  NECK: normal movements of the head and neck  LUNGS: on inspection no signs of respiratory distress, breathing rate appears normal, no obvious gross SOB, gasping or wheezing  CV: no obvious cyanosis  MS: moves all  visible extremities without noticeable abnormality  PSYCH/NEURO: pleasant and cooperative, no obvious depression or anxiety, speech and thought processing grossly intact  ASSESSMENT AND PLAN:  Discussed the following assessment and plan:  Neuropathy - Plan: Vitamin B12, RBC Folate, RPR  Prediabetes - Plan: Hemoglobin A1c, Urine Microalbumin w/creat. ratio  Hypothyroidism due to acquired atrophy of thyroid - Plan: Lipid panel, Comprehensive metabolic panel, TSH  Pure hypercholesterolemia  Diverticulosis of colon  S/P laparoscopic appendectomy  HYPERLIPIDEMIA Has been tolerating Repatha.  Repeat lipids ordered  Diverticulosis of colon Severe sigmoid by 2016 colonoscopy.  Denies constipation   S/P laparoscopic appendectomy Uneventful post op course  Encouraged to resume exercising   Neuropathy She describes chronic burning sensation in both feet at night,  Since her statin trial over a year ago.  Topical creams helping  Screening for reversible conditions ordered   Prediabetes Her  random and fasting glucoses have not been  diagnosticbut her A1c suggests she is at risk for developing diabetes.  I recommend she follow a low glycemic index diet and particpate regularly in an aerobic  exercise activity.  We should check an A1c in January.  Lab Results  Component Value Date   HGBA1C 6.4 01/07/2019         I discussed the assessment and treatment plan with the patient. The patient was provided an opportunity to ask questions and all were answered. The patient agreed with the plan and demonstrated an understanding of the instructions.   The patient was advised to call back or seek an in-person evaluation if the symptoms worsen or if the condition fails to improve as anticipated.  I provided  25 minutes of non-face-to-face time during this encounter reviewing patient's current problems and post surgeries.  Providing counseling on the above mentioned problems , and coordination   of care .   [name], MD

## 2019-05-31 ENCOUNTER — Other Ambulatory Visit: Payer: Self-pay

## 2019-05-31 MED ORDER — LEVOTHYROXINE SODIUM 75 MCG PO TABS: ORAL_TABLET | ORAL | 0 refills | Status: DC

## 2019-06-06 ENCOUNTER — Telehealth: Payer: Self-pay

## 2019-06-06 NOTE — Telephone Encounter (Signed)
Called and lmomed the pt stating that the repatha was denied and cannot be overturned unless current lab work is completed. Instructed the pt to call back.

## 2019-06-13 NOTE — Telephone Encounter (Signed)
Lipid panel ordered by PCP and she will complete this week.

## 2019-06-15 ENCOUNTER — Other Ambulatory Visit: Payer: Self-pay

## 2019-06-17 ENCOUNTER — Other Ambulatory Visit (INDEPENDENT_AMBULATORY_CARE_PROVIDER_SITE_OTHER): Payer: Medicare Other

## 2019-06-17 ENCOUNTER — Other Ambulatory Visit: Payer: Self-pay

## 2019-06-17 DIAGNOSIS — G629 Polyneuropathy, unspecified: Secondary | ICD-10-CM | POA: Diagnosis not present

## 2019-06-17 DIAGNOSIS — R7303 Prediabetes: Secondary | ICD-10-CM

## 2019-06-17 DIAGNOSIS — E034 Atrophy of thyroid (acquired): Secondary | ICD-10-CM

## 2019-06-17 LAB — COMPREHENSIVE METABOLIC PANEL
ALT: 17 U/L (ref 0–35)
AST: 23 U/L (ref 0–37)
Albumin: 4.1 g/dL (ref 3.5–5.2)
Alkaline Phosphatase: 60 U/L (ref 39–117)
BUN: 15 mg/dL (ref 6–23)
CO2: 25 mEq/L (ref 19–32)
Calcium: 9.3 mg/dL (ref 8.4–10.5)
Chloride: 103 mEq/L (ref 96–112)
Creatinine, Ser: 0.77 mg/dL (ref 0.40–1.20)
GFR: 72.31 mL/min (ref >60.00)
Glucose, Bld: 95 mg/dL (ref 70–99)
Potassium: 4.2 mEq/L (ref 3.5–5.1)
Sodium: 138 mEq/L (ref 135–145)
Total Bilirubin: 0.5 mg/dL (ref 0.2–1.2)
Total Protein: 6.3 g/dL (ref 6.0–8.3)

## 2019-06-17 LAB — MICROALBUMIN / CREATININE URINE RATIO
Creatinine,U: 44.6 mg/dL
Microalb Creat Ratio: 1.6 mg/g (ref 0.0–30.0)
Microalb, Ur: <0.7 mg/dL (ref 0.0–1.9)

## 2019-06-17 LAB — LIPID PANEL
Cholesterol: 127 mg/dL (ref 0–200)
HDL: 56 mg/dL (ref >39.00)
LDL Cholesterol: 37 mg/dL (ref 0–99)
NonHDL: 70.55
Total CHOL/HDL Ratio: 2
Triglycerides: 167 mg/dL — ABNORMAL HIGH (ref 0.0–149.0)
VLDL: 33.4 mg/dL (ref 0.0–40.0)

## 2019-06-17 LAB — TSH: TSH: 6.15 u[IU]/mL — ABNORMAL HIGH (ref 0.35–4.50)

## 2019-06-17 LAB — VITAMIN B12: Vitamin B-12: 1187 pg/mL — ABNORMAL HIGH (ref 211–911)

## 2019-06-17 LAB — HEMOGLOBIN A1C: Hgb A1c MFr Bld: 6.1 % (ref 4.6–6.5)

## 2019-06-20 LAB — FOLATE RBC: RBC Folate: 951 ng/mL RBC (ref >280)

## 2019-06-20 LAB — RPR: RPR Ser Ql: NONREACTIVE

## 2019-06-20 MED ORDER — LEVOTHYROXINE SODIUM 88 MCG PO TABS: ORAL_TABLET | ORAL | 1 refills | Status: DC

## 2019-06-20 NOTE — Assessment & Plan Note (Signed)
Patient's thyroid function is underactive on current levothyroxine dose of 75  mcg daily.  Will increase to 88 mcg daily and repeat tsh in 6 weeks

## 2019-06-20 NOTE — Progress Notes (Signed)
88 MCG DOSE SENT TO MAIL ORDER ON DEC 7

## 2019-06-20 NOTE — Addendum Note (Signed)
Addended by: [name] on: 06/20/2019 07:58 AM   Modules accepted: Orders

## 2019-07-06 ENCOUNTER — Telehealth: Payer: Self-pay | Admitting: Cardiology

## 2019-07-06 NOTE — Telephone Encounter (Signed)
Veronica Barron (Key: [id number]) Repatha SureClick 140MG /ML Antonito SOAJ   Form [employer] [place] [employer] Part D General Authorization Form Created 1 day ago Sent to Plan 1 day ago Plan Response 1 day ago Submit Clinical Questions 1 day ago Determination Favorable 1 day ago Message from Plan Effective from 07/04/2019 through 07/03/2020.

## 2019-07-06 NOTE — Telephone Encounter (Signed)
Veronica Barron from [facility] is calling giving authorization for the medication Evolocumab (REPATHA SURECLICK) [id number] MG/ML SOAJ

## 2019-07-26 NOTE — Progress Notes (Signed)
Veronica Barron Date of Birth: Jun 08, 1941 Medical Record #[id number]  History of Present Illness: Veronica Barron is seen today for followup CAD. She has a history of hypertension, hyperlipidemia, and hypothyroidism. In Sept 2014 she had stenting of the proximal LAD with DES.  The distal left circumflex was occluded with left to left collaterals and right-to-left collaterals. The right coronary was without significant disease. Ejection fraction was 30%.  Her other disease was treated medically. On followup Echo in Dec. 2014 EF had improved to 50-55%. In November 2015 she had a significant episode of chest pain. Repeat cardiac cath showed occlusion of the distal LCx and the stent in the LAD was patent.  She has a history of intolerance to multiple statins, Zetia and Lopid, and fenofibrates. She was previously unable afford a PCSK 9 inhibitor and was seen by Pharm D and it was authorized and she is now on it.    She was admitted in October 2020 with acute appendicitis. She had appy without complications.   On follow up today she reports she is feeling very well. No chest pain or SOB. She is still quite active with her walking. Good energy.   Current Outpatient Medications on File Prior to Visit  Medication Sig Dispense Refill  . ALFALFA PO Take 1 tablet by mouth daily.    Marland Kitchen aspirin EC 81 MG tablet Take 81 mg by mouth daily.    . carvedilol (COREG) 6.25 MG tablet TAKE ONE TABLET BY MOUTH TWICE DAILY WITH MEALS 180 tablet 3  . clopidogrel (PLAVIX) 75 MG tablet TAKE 1 TABLET BY MOUTH DAILY. GENERIC EQUIVALENT FOR PLAVIX 90 tablet 2  . clorazepate (TRANXENE-T) 7.5 MG tablet Take 1 tablet (7.5 mg total) by mouth at bedtime as needed for anxiety. 30 tablet 0  . Evolocumab (REPATHA SURECLICK) 366 MG/ML SOAJ Inject 140 mg into the skin every 14 (fourteen) days. 2 pen 12  . levothyroxine (SYNTHROID) 88 MCG tablet TAKE 1 TABLET BY MOUTH DAILY BEFORE BREAKFAST. 90 tablet 1  . lisinopril (ZESTRIL) 5 MG tablet  Take 1 tablet (5 mg total) by mouth 2 (two) times daily. 180 tablet 1  . multivitamin-lutein (OCUVITE-LUTEIN) CAPS capsule Take 1 capsule by mouth daily.    . sertraline (ZOLOFT) 50 MG tablet TAKE 1 TABLET BY MOUTH DAILY 90 tablet 1  . vitamin B-12 (CYANOCOBALAMIN) 500 MCG tablet Take 500 mcg by mouth daily.    . vitamin C (ASCORBIC ACID) 500 MG tablet Take 500 mg by mouth daily.     No current facility-administered medications on file prior to visit.    Allergies  Allergen Reactions  . Diphenhydramine Hcl     REACTION: TACHYCARDIA  . Fenofibrate Other (See Comments)    constipation  . Statins     Myalgia   . Zetia [Ezetimibe] Other (See Comments)    Myalgias     Past Medical History:  Diagnosis Date  . Allergic rhinitis, cause unspecified   . CAD (coronary artery disease)    a. 03/2013 Abnl MV, EF 29%;  b. 03/2013 Cath/PCI: >M nl, LAD 99p (2.5x28 Promus Prem DES), LCX 100 after large OM1, RCA dominant, 20p, EF 30%.  . Diverticulosis of colon   . Hyperlipidemia   . Hypertension   . Ischemic cardiomyopathy    a. 03/2013 EF 30% by LV gram.;  b. Echo (12/14): Mod LVH with severe basal septal hypertrophy w/o LVOT obstr, EF 50-55%, normal wall motion, Gr 1 DD, MAC, mild MR, mild LAE, trivial  eff.  . Leiomyoma of uterus, unspecified   . Unspecified hypothyroidism     Past Surgical History:  Procedure Laterality Date  . ABDOMINAL HYSTERECTOMY     fibroid   at  age 60  . CHOLECYSTECTOMY    . LAPAROSCOPIC APPENDECTOMY N/A 04/26/2019   Procedure: APPENDECTOMY LAPAROSCOPIC;  Surgeon: Jules Husbands, MD;  Location: [facility] ORS;  Service: General;  Laterality: N/A;  . LEFT HEART CATHETERIZATION WITH CORONARY ANGIOGRAM N/A 03/16/2013   Procedure: LEFT HEART CATHETERIZATION WITH CORONARY ANGIOGRAM;  Surgeon: Innocence Schlotzhauer M [name], MD;  Location: [facility] CATH LAB;  Service: Cardiovascular;  Laterality: N/A;  . LEFT HEART CATHETERIZATION WITH CORONARY ANGIOGRAM N/A 05/23/2014   Procedure: LEFT HEART  CATHETERIZATION WITH CORONARY ANGIOGRAM;  Surgeon: Lorann Tani M [name], MD;  Location: [facility] CATH LAB;  Service: Cardiovascular;  Laterality: N/A;  . PERCUTANEOUS CORONARY STENT INTERVENTION (PCI-S)  03/16/2013   Procedure: PERCUTANEOUS CORONARY STENT INTERVENTION (PCI-S);  Surgeon: Anisten Tomassi M [name], MD;  Location: [facility] CATH LAB;  Service: Cardiovascular;;  . TUBAL LIGATION      Social History   Tobacco Use  Smoking Status Never Smoker  Smokeless Tobacco Never Used    Social History   Substance and Sexual Activity  Alcohol Use Yes   Comment: wine    Family History  Problem Relation Age of Onset  . Dementia Mother   . Hyperlipidemia Mother   . Hypertension Mother   . Coronary artery disease Father   . Hypertension Father   . Hyperlipidemia Father   . Uterine cancer Sister 64  . Breast cancer Maternal Aunt     Review of Systems: As noted in history of present illness.  All other systems were reviewed and are negative.  Physical Exam: BP (!) 141/82   Pulse 74   Ht _0  (1.6 m)   Wt 152 lb 12.8 oz (69.3 kg)   SpO2 98%   BMI 27.07 kg/m  GENERAL:  Well appearing WF in NAD HEENT:  PERRL, EOMI, sclera are clear. Oropharynx is clear. NECK:  No jugular venous distention, carotid upstroke brisk and symmetric, no bruits, no thyromegaly or adenopathy LUNGS:  Clear to auscultation bilaterally CHEST:  Unremarkable HEART:  RRR,  PMI not displaced or sustained,S1 and S2 within normal limits, no S3, no S4: no clicks, no rubs, no murmurs ABD:  Soft, nontender. BS +, no masses or bruits. No hepatomegaly, no splenomegaly EXT:  2 + pulses throughout, no edema, no cyanosis no clubbing SKIN:  Warm and dry.  No rashes NEURO:  Alert and oriented x 3. Cranial nerves II through XII intact. PSYCH:  Cognitively intact  LABORATORY DATA: Lab Results  Component Value Date   WBC 8.7 04/27/2019   HGB 11.6 (L) 04/27/2019   HCT 37.5 04/27/2019   PLT 349 04/27/2019   GLUCOSE 95 06/17/2019   CHOL 127  06/17/2019   TRIG 167.0 (H) 06/17/2019   HDL 56.00 06/17/2019   LDLDIRECT 193.0 04/26/2018   LDLCALC 37 06/17/2019   ALT 17 06/17/2019   AST 23 06/17/2019   NA 138 06/17/2019   K 4.2 06/17/2019   CL 103 06/17/2019   CREATININE 0.77 06/17/2019   BUN 15 06/17/2019   CO2 25 06/17/2019   TSH 6.15 (H) 06/17/2019   INR 0.92 05/17/2014   HGBA1C 6.1 06/17/2019   MICROALBUR <0.7 06/17/2019    Assessment / Plan: 1. Coronary disease status post stenting of the proximal LAD in September 2014 with drug-eluting stent. 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 Cardiac cath in November 2015 showed continued stent patency. She is asymptomatic.  Continue current therapy.   2. Hyperlipidemia. History of intolerance to multiple statins, Zetia, fenofibrates, and Lopid. Now on Repatha with marked reduction in LDL to 37. Tolerating well.   3. Hypertension- good control. Continue Rx.  4. Hypothyroidism. TSH normal.

## 2019-07-30 ENCOUNTER — Other Ambulatory Visit: Payer: Self-pay | Admitting: Internal Medicine

## 2019-08-01 ENCOUNTER — Other Ambulatory Visit: Payer: Self-pay

## 2019-08-01 ENCOUNTER — Encounter: Payer: Self-pay | Admitting: Cardiology

## 2019-08-01 ENCOUNTER — Encounter (INDEPENDENT_AMBULATORY_CARE_PROVIDER_SITE_OTHER): Payer: Self-pay

## 2019-08-01 ENCOUNTER — Ambulatory Visit: Payer: Medicare Other | Admitting: Cardiology

## 2019-08-01 VITALS — BP 141/82 | HR 74 | Ht 63.0 in | Wt 152.8 lb

## 2019-08-01 DIAGNOSIS — I10 Essential (primary) hypertension: Secondary | ICD-10-CM | POA: Diagnosis not present

## 2019-08-01 DIAGNOSIS — I2510 Atherosclerotic heart disease of native coronary artery without angina pectoris: Secondary | ICD-10-CM

## 2019-08-01 DIAGNOSIS — E7800 Pure hypercholesterolemia, unspecified: Secondary | ICD-10-CM

## 2019-08-08 ENCOUNTER — Telehealth: Payer: Self-pay | Admitting: Internal Medicine

## 2019-08-08 MED ORDER — INDOMETHACIN 50 MG PO CAPS: 50.0000 mg | ORAL_CAPSULE | Freq: Three times a day (TID) | ORAL | 0 refills | Status: DC

## 2019-08-08 NOTE — Telephone Encounter (Signed)
Spoke with pt to let her know that the medication has been sent in and how she needs to take it.

## 2019-08-08 NOTE — Telephone Encounter (Signed)
Pt needs a refill INDOMETHACIN for gout.

## 2019-08-08 NOTE — Telephone Encounter (Signed)
We used indomethacin for her last attack several yeARS ago 50 mg every 8 hours,  Plus tylenol 650 mg every 8 hours  rx sent

## 2019-08-08 NOTE — Telephone Encounter (Signed)
LMTCB

## 2019-08-08 NOTE — Telephone Encounter (Signed)
Spoke with pt and she stated that she is having a current gout flare in the 2nd toe on the right foot. The pt stated that her toe is red, swollen and painful and has been going on for 4 days.

## 2019-09-12 ENCOUNTER — Other Ambulatory Visit: Payer: Self-pay | Admitting: *Deleted

## 2019-09-12 MED ORDER — LISINOPRIL 5 MG PO TABS: 5.0000 mg | ORAL_TABLET | Freq: Two times a day (BID) | ORAL | 2 refills | Status: DC

## 2019-09-13 ENCOUNTER — Other Ambulatory Visit: Payer: Self-pay

## 2019-11-30 ENCOUNTER — Telehealth: Payer: Self-pay | Admitting: Internal Medicine

## 2019-11-30 MED ORDER — LEVOTHYROXINE SODIUM 88 MCG PO TABS: ORAL_TABLET | ORAL | 1 refills | Status: DC

## 2019-11-30 NOTE — Telephone Encounter (Signed)
Needs a refill on levothyroxine (SYNTHROID) 88 MCG tablet sent to mail order

## 2020-01-20 ENCOUNTER — Other Ambulatory Visit: Payer: Self-pay | Admitting: Pharmacist Clinician (PhC)/ Clinical Pharmacy Specialist

## 2020-01-20 MED ORDER — REPATHA SURECLICK 140 MG/ML ~~LOC~~ SOAJ: 140.0000 mg | SUBCUTANEOUS | 12 refills | Status: DC

## 2020-01-26 ENCOUNTER — Other Ambulatory Visit: Payer: Self-pay

## 2020-01-26 MED ORDER — SERTRALINE HCL 50 MG PO TABS: 50.0000 mg | ORAL_TABLET | Freq: Every day | ORAL | 1 refills | Status: DC

## 2020-01-27 ENCOUNTER — Other Ambulatory Visit: Payer: Self-pay

## 2020-01-27 MED ORDER — CLOPIDOGREL BISULFATE 75 MG PO TABS: 75.0000 mg | ORAL_TABLET | Freq: Every day | ORAL | 1 refills | Status: DC

## 2020-01-27 NOTE — Telephone Encounter (Signed)
Rx(s) sent to pharmacy electronically.  

## 2020-01-31 ENCOUNTER — Telehealth: Payer: Self-pay | Admitting: Cardiology

## 2020-01-31 NOTE — Telephone Encounter (Signed)
caled the pt and got them reapproved for the [employer] for the next year of repatha, pt voiced understanding and was instructed to call back

## 2020-01-31 NOTE — Telephone Encounter (Signed)
    Pt c/o medication issue:  1. Name of Medication:   Evolocumab (REPATHA SURECLICK) 621 MG/ML SOAJ    2. How are you currently taking this medication (dosage and times per day)? Inject 140 mg into the skin every 14 (fourteen) days.  3. Are you having a reaction (difficulty breathing--STAT)?   4. What is your medication issue? Pt is calling, she wanted to get extension for patient assistance for repatha

## 2020-01-31 NOTE — Telephone Encounter (Signed)
Will route to PharmD to advise regarding repatha question.  Thanks!

## 2020-02-04 NOTE — Progress Notes (Signed)
Veronica Barron Date of Birth: 17-Oct-1940 Medical Record #[id number]  History of Present Illness: Veronica Barron is seen today for followup CAD. She has a history of hypertension, hyperlipidemia, and hypothyroidism. In Sept 2014 she had stenting of the proximal LAD with DES.  The distal left circumflex was occluded with left to left collaterals and right-to-left collaterals. The right coronary was without significant disease. Ejection fraction was 30%.  Her other disease was treated medically. On followup Echo in Dec. 2014 EF had improved to 50-55%. In November 2015 she had a significant episode of chest pain. Repeat cardiac cath showed occlusion of the distal LCx and the stent in the LAD was patent.   She has a history of intolerance to multiple statins, Zetia and Lopid, and fenofibrates. She was previously unable afford a PCSK 9 inhibitor and was seen by Pharm D and it was authorized and she is now on it.    She was admitted in October 2020 with acute appendicitis. She had appy without complications.   On follow up today she reports she is feeling very well. No chest pain or SOB. She is still quite active with her walking. Good energy. 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 Notes significant improvement.    Current Outpatient Medications on File Prior to Visit  Medication Sig Dispense Refill  . ALFALFA PO Take 1 tablet by mouth daily.    Marland Kitchen aspirin EC 81 MG tablet Take 81 mg by mouth daily.    . carvedilol (COREG) 6.25 MG tablet TAKE ONE TABLET BY MOUTH TWICE DAILY WITH MEALS 180 tablet 3  . clopidogrel (PLAVIX) 75 MG tablet Take 1 tablet (75 mg total) by mouth daily. 90 tablet 1  . clorazepate (TRANXENE-T) 7.5 MG tablet Take 1 tablet (7.5 mg total) by mouth at bedtime as needed for anxiety. 30 tablet 0  . Evolocumab (REPATHA SURECLICK) 010 MG/ML SOAJ Inject 140 mg into the skin every 14 (fourteen) days. 2 pen 12  . indomethacin  (INDOCIN) 50 MG capsule Take 1 capsule (50 mg total) by mouth 3 (three) times daily with meals. 30 capsule 0  . levothyroxine (SYNTHROID) 88 MCG tablet TAKE 1 TABLET BY MOUTH DAILY BEFORE BREAKFAST. 90 tablet 1  . lisinopril (ZESTRIL) 5 MG tablet Take 1 tablet (5 mg total) by mouth 2 (two) times daily. 180 tablet 2  . multivitamin-lutein (OCUVITE-LUTEIN) CAPS capsule Take 1 capsule by mouth daily.    . sertraline (ZOLOFT) 50 MG tablet Take 1 tablet (50 mg total) by mouth daily. 90 tablet 1  . vitamin B-12 (CYANOCOBALAMIN) 500 MCG tablet Take 500 mcg by mouth daily.    . vitamin C (ASCORBIC ACID) 500 MG tablet Take 500 mg by mouth daily.     No current facility-administered medications on file prior to visit.    Allergies  Allergen Reactions  . Diphenhydramine Hcl     REACTION: TACHYCARDIA  . Fenofibrate Other (See Comments)    constipation  . Statins     Myalgia   . Zetia [Ezetimibe] Other (See Comments)    Myalgias     Past Medical History:  Diagnosis Date  . Allergic rhinitis, cause unspecified   . CAD (coronary artery disease)    a. 03/2013 Abnl MV, EF 29%;  b. 03/2013 Cath/PCI: >M nl, LAD 99p (2.5x28 Promus Prem DES), LCX 100 after large OM1, RCA dominant, 20p, EF 30%.  . Diverticulosis of colon   .  Hyperlipidemia   . Hypertension   . Ischemic cardiomyopathy    a. 03/2013 EF 30% by LV gram.;  b. Echo (12/14): Mod LVH with severe basal septal hypertrophy w/o LVOT obstr, EF 50-55%, normal wall motion, Gr 1 DD, MAC, mild MR, mild LAE, trivial eff.  . Leiomyoma of uterus, unspecified   . Unspecified hypothyroidism     Past Surgical History:  Procedure Laterality Date  . ABDOMINAL HYSTERECTOMY     fibroid   at  age 26  . CHOLECYSTECTOMY    . LAPAROSCOPIC APPENDECTOMY N/A 04/26/2019   Procedure: APPENDECTOMY LAPAROSCOPIC;  Surgeon: Veronica Barron;  Location: [facility] ORS;  Service: General;  Laterality: N/A;  . LEFT HEART CATHETERIZATION WITH CORONARY ANGIOGRAM N/A 03/16/2013    Procedure: LEFT HEART CATHETERIZATION WITH CORONARY ANGIOGRAM;  Surgeon: Veronica Racette M [name], Barron;  Location: [facility] CATH LAB;  Service: Cardiovascular;  Laterality: N/A;  . LEFT HEART CATHETERIZATION WITH CORONARY ANGIOGRAM N/A 05/23/2014   Procedure: LEFT HEART CATHETERIZATION WITH CORONARY ANGIOGRAM;  Surgeon: Veronica Mcloughlin M [name], Barron;  Location: [facility] CATH LAB;  Service: Cardiovascular;  Laterality: N/A;  . PERCUTANEOUS CORONARY STENT INTERVENTION (PCI-S)  03/16/2013   Procedure: PERCUTANEOUS CORONARY STENT INTERVENTION (PCI-S);  Surgeon: Elberta Lachapelle M [name], Barron;  Location: [facility] CATH LAB;  Service: Cardiovascular;;  . TUBAL LIGATION      Social History   Tobacco Use  Smoking Status Never Smoker  Smokeless Tobacco Never Used    Social History   Substance and Sexual Activity  Alcohol Use Yes   Comment: wine    Family History  Problem Relation Age of Onset  . Dementia Mother   . Hyperlipidemia Mother   . Hypertension Mother   . Coronary artery disease Father   . Hypertension Father   . Hyperlipidemia Father   . Uterine cancer Sister 54  . Breast cancer Maternal Aunt     Review of Systems: As noted in history of present illness.  All other systems were reviewed and are negative.  Physical Exam: BP (!) 150/82   Pulse 62   Ht _0  (1.6 m)   Wt 153 lb (69.4 kg)   SpO2 97%   BMI 27.10 kg/m  GENERAL:  Well appearing WF in NAD HEENT:  PERRL, EOMI, sclera are clear. Oropharynx is clear. NECK:  No jugular venous distention, carotid upstroke brisk and symmetric, no bruits, no thyromegaly or adenopathy LUNGS:  Clear to auscultation bilaterally CHEST:  Unremarkable HEART:  RRR,  PMI not displaced or sustained,S1 and S2 within normal limits, no S3, no S4: no clicks, no rubs, no murmurs ABD:  Soft, nontender. BS +, no masses or bruits. No hepatomegaly, no splenomegaly EXT:  2 + pulses throughout, no edema, no cyanosis no clubbing SKIN:  Warm and dry.  No rashes NEURO:  Alert and oriented x 3.  Cranial nerves II through XII intact. 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 Twave inversion laterally. No change.  Rate 62. I have personally reviewed and interpreted this study.  Assessment / Plan: 1. Coronary disease status post stenting of the proximal LAD in September 2014 with drug-eluting stent. 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 Cardiac cath in November 2015 showed continued stent patency. She is asymptomatic.  Continue current therapy. We did discuss stopping Plavix but she has had no bleeding or other complaints so will continue for now.  2. Hyperlipidemia. History of intolerance to multiple statins, Zetia, fenofibrates, and Lopid. Now on Repatha with marked reduction in LDL to 37. Tolerating well.   3. Hypertension- good control. Continue Rx.  4. Hypothyroidism. TSH normal.   Follow up in 6 months

## 2020-02-07 ENCOUNTER — Ambulatory Visit: Payer: Medicare Other | Admitting: Cardiology

## 2020-02-07 ENCOUNTER — Encounter: Payer: Self-pay | Admitting: Cardiology

## 2020-02-07 ENCOUNTER — Other Ambulatory Visit: Payer: Self-pay

## 2020-02-07 VITALS — BP 150/82 | HR 62 | Ht 63.0 in | Wt 153.0 lb

## 2020-02-07 DIAGNOSIS — E7800 Pure hypercholesterolemia, unspecified: Secondary | ICD-10-CM | POA: Diagnosis not present

## 2020-02-07 DIAGNOSIS — I2510 Atherosclerotic heart disease of native coronary artery without angina pectoris: Secondary | ICD-10-CM

## 2020-02-07 DIAGNOSIS — I10 Essential (primary) hypertension: Secondary | ICD-10-CM | POA: Diagnosis not present

## 2020-02-14 DIAGNOSIS — H524 Presbyopia: Secondary | ICD-10-CM | POA: Diagnosis not present

## 2020-03-22 ENCOUNTER — Other Ambulatory Visit: Payer: Self-pay | Admitting: Internal Medicine

## 2020-03-22 DIAGNOSIS — Z1231 Encounter for screening mammogram for malignant neoplasm of breast: Secondary | ICD-10-CM

## 2020-04-04 ENCOUNTER — Ambulatory Visit
Admission: RE | Admit: 2020-04-04 | Discharge: 2020-04-04 | Disposition: A | Discharge status: Home / Self Care | Payer: Medicare Other | Source: Ambulatory Visit | Attending: Internal Medicine | Admitting: Internal Medicine

## 2020-04-04 ENCOUNTER — Other Ambulatory Visit: Payer: Self-pay

## 2020-04-04 DIAGNOSIS — Z1231 Encounter for screening mammogram for malignant neoplasm of breast: Secondary | ICD-10-CM

## 2020-05-04 ENCOUNTER — Other Ambulatory Visit: Payer: Self-pay | Admitting: *Deleted

## 2020-05-04 MED ORDER — CARVEDILOL 6.25 MG PO TABS: ORAL_TABLET | ORAL | 2 refills | Status: DC

## 2020-05-04 NOTE — Telephone Encounter (Signed)
Rx has been sent to the pharmacy electronically. ° °

## 2020-05-09 ENCOUNTER — Telehealth: Payer: Self-pay | Admitting: Internal Medicine

## 2020-05-09 MED ORDER — SERTRALINE HCL 50 MG PO TABS: 50.0000 mg | ORAL_TABLET | Freq: Every day | ORAL | 0 refills | Status: DC

## 2020-05-09 NOTE — Addendum Note (Signed)
Addended by: Elpidio Galea T on: 05/09/2020 09:53 AM   Modules accepted: Orders

## 2020-05-09 NOTE — Telephone Encounter (Signed)
Pt needs a short supply of sertraline (ZOLOFT) 50 MG tablet sent to [facility]  While mail order is being processed

## 2020-05-30 ENCOUNTER — Ambulatory Visit (INDEPENDENT_AMBULATORY_CARE_PROVIDER_SITE_OTHER): Payer: Medicare Other

## 2020-05-30 VITALS — BP 139/65 | HR 75 | Ht 63.0 in | Wt 153.0 lb

## 2020-05-30 DIAGNOSIS — Z0000 Encounter for general adult medical examination without abnormal findings: Secondary | ICD-10-CM

## 2020-05-30 NOTE — Progress Notes (Addendum)
Subjective:   Veronica Barron is a 79 y.o. female who presents for Medicare Annual (Subsequent) preventive examination.  Review of Systems    No ROS.  Medicare Wellness Virtual Visit.     Cardiac Risk Factors include: advanced age (>15mn, >>36women);hypertension     Objective:    Today's Vitals   05/30/20 1234  BP: 139/65  Pulse: 75  Weight: 153 lb (69.4 kg)  Height: _0  (1.6 m)   Body mass index is 27.1 kg/m.  Advanced Directives 05/30/2020 05/30/2019 04/27/2019 04/26/2019 05/26/2018 05/25/2017 05/19/2016  Does Patient Have a Medical Advance Directive? Yes Yes - No Yes Yes Yes  Type of AParamedicof AMartellLiving will Living will;Healthcare Power of AMarineLiving will HFaithLiving will HWillow GroveLiving will  Does patient want to make changes to medical advance directive? No - Patient declined No - Patient declined - - No - Patient declined No - Patient declined -  Copy of HLibertyin Chart? Yes - validated most recent copy scanned in chart (See row information) Yes - validated most recent copy scanned in chart (See row information) - - Yes - validated most recent copy scanned in chart (See row information) Yes No - copy requested  Would patient like information on creating a medical advance directive? - - No - Patient declined - - - -    Current Medications (verified) Outpatient Encounter Medications as of 05/30/2020  Medication Sig   ALFALFA PO Take 1 tablet by mouth daily.   aspirin EC 81 MG tablet Take 81 mg by mouth daily.   carvedilol (COREG) 6.25 MG tablet TAKE ONE TABLET BY MOUTH TWICE DAILY WITH MEALS   clopidogrel (PLAVIX) 75 MG tablet Take 1 tablet (75 mg total) by mouth daily.   clorazepate (TRANXENE-T) 7.5 MG tablet Take 1 tablet (7.5 mg total) by mouth at bedtime as needed for anxiety.   Evolocumab (REPATHA SURECLICK) 1761MG/ML SOAJ  Inject 140 mg into the skin every 14 (fourteen) days.   indomethacin (INDOCIN) 50 MG capsule Take 1 capsule (50 mg total) by mouth 3 (three) times daily with meals.   levothyroxine (SYNTHROID) 88 MCG tablet TAKE 1 TABLET BY MOUTH DAILY BEFORE BREAKFAST.   lisinopril (ZESTRIL) 5 MG tablet Take 1 tablet (5 mg total) by mouth 2 (two) times daily.   multivitamin-lutein (OCUVITE-LUTEIN) CAPS capsule Take 1 capsule by mouth daily.   sertraline (ZOLOFT) 50 MG tablet Take 1 tablet (50 mg total) by mouth daily.   vitamin B-12 (CYANOCOBALAMIN) 500 MCG tablet Take 500 mcg by mouth daily.   vitamin C (ASCORBIC ACID) 500 MG tablet Take 500 mg by mouth daily.   No facility-administered encounter medications on file as of 05/30/2020.    Allergies (verified) Diphenhydramine hcl, Fenofibrate, Statins, and Zetia [ezetimibe]   History: Past Medical History:  Diagnosis Date   Allergic rhinitis, cause unspecified    CAD (coronary artery disease)    a. 03/2013 Abnl MV, EF 29%;  b. 03/2013 Cath/PCI: >M nl, LAD 99p (2.5x28 Promus Prem DES), LCX 100 after large OM1, RCA dominant, 20p, EF 30%.   Diverticulosis of colon    Hyperlipidemia    Hypertension    Ischemic cardiomyopathy    a. 03/2013 EF 30% by LV gram.;  b. Echo (12/14): Mod LVH with severe basal septal hypertrophy w/o LVOT obstr, EF 50-55%, normal wall motion, Gr 1 DD, MAC, mild MR, mild LAE,  trivial eff.   Leiomyoma of uterus, unspecified    Unspecified hypothyroidism    Past Surgical History:  Procedure Laterality Date   ABDOMINAL HYSTERECTOMY     fibroid   at  age 73   [place] N/A 04/26/2019   Procedure: APPENDECTOMY LAPAROSCOPIC;  Surgeon: Jules Husbands, MD;  Location: [facility] ORS;  Service: General;  Laterality: N/A;   LEFT HEART CATHETERIZATION WITH CORONARY ANGIOGRAM N/A 03/16/2013   Procedure: LEFT HEART CATHETERIZATION WITH CORONARY ANGIOGRAM;  Surgeon: [name] [name], MD;  Location: [facility] CATH LAB;  Service:  Cardiovascular;  Laterality: N/A;   LEFT HEART CATHETERIZATION WITH CORONARY ANGIOGRAM N/A 05/23/2014   Procedure: LEFT HEART CATHETERIZATION WITH CORONARY ANGIOGRAM;  Surgeon: [name] [name], MD;  Location: [facility] CATH LAB;  Service: Cardiovascular;  Laterality: N/A;   PERCUTANEOUS CORONARY STENT INTERVENTION (PCI-S)  03/16/2013   Procedure: PERCUTANEOUS CORONARY STENT INTERVENTION (PCI-S);  Surgeon: [name] [name], MD;  Location: [facility] CATH LAB;  Service: Cardiovascular;;   TUBAL LIGATION     Family History  Problem Relation Age of Onset   Dementia Mother    Hyperlipidemia Mother    Hypertension Mother    Coronary artery disease Father    Hypertension Father    Hyperlipidemia Father    Uterine cancer Sister 7   Breast cancer Maternal Aunt    Social History   Socioeconomic History   Marital status: Married    Spouse name: Not on file   Number of children: 3   Years of education: Not on file   Highest education level: Not on file  Occupational History    Employer: RETIRED  Tobacco Use   Smoking status: Never Smoker   Smokeless tobacco: Never Used  Vaping Use   Vaping Use: Never used  Substance and Sexual Activity   Alcohol use: Yes    Comment: wine   Drug use: No   Sexual activity: Not Currently    Birth control/protection: Post-menopausal  Other Topics Concern   Not on file  Social History Narrative   High school graduate, married in [place], 3 sons ages 105, 24, 63; 4 grandchildren, 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   Social Determinants of Health   Financial Resource Strain: Low Risk    Difficulty of Paying Living Expenses: Not hard at all  Food Insecurity: No Food Insecurity   Worried About Charity fundraiser in the Last Year: Never true   [place] in the Last Year: Never true  Transportation Needs: No Transportation Needs   Lack of Transportation (Medical): No   Lack of Transportation (Non-Medical): No  Physical Activity: Sufficiently Active    Days of Exercise per Week: 7 days   Minutes of Exercise per Session: 30 min  Stress: No Stress Concern Present   Feeling of Stress : Not at all  Social Connections: Unknown   Frequency of Communication with Friends and Family: Not on file   Frequency of Social Gatherings with Friends and Family: Not on file   Attends Religious Services: Not on Electrical engineer or Organizations: Not on file   Attends Archivist Meetings: Not on file   Marital Status: Married    Tobacco Counseling Counseling given: Not Answered   Clinical Intake:  Pre-visit preparation completed: Yes        Diabetes: No  How often do you need to have someone help you when you read instructions, pamphlets,  or other written materials from your doctor or pharmacy?: 1 - Never  Interpreter Needed?: No      Activities of Daily Living In your present state of health, do you have any difficulty performing the following activities: 05/30/2020  Hearing? N  Vision? N  Difficulty concentrating or making decisions? N  Walking or climbing stairs? N  Dressing or bathing? N  Doing errands, shopping? N  Preparing Food and eating ? N  Using the Toilet? N  In the past six months, have you accidently leaked urine? N  Do you have problems with loss of bowel control? N  Managing your Medications? N  Managing your Finances? N  Housekeeping or managing your Housekeeping? N  Some recent data might be hidden    Patient Care Team: [name], MD as PCP - General (Internal Medicine) [name], [name], MD as PCP - Cardiology (Cardiology) Judeth Horn, MD (General Surgery) Clent Jacks, MD (Ophthalmology)  Indicate any recent Medical Services you may have received from other than [facility] providers in the past year (date may be approximate).     Assessment:   This is a routine wellness examination for Darling.  I connected with Shaday today by telephone and verified that I am speaking with the  correct person using two identifiers. Location patient: home Location provider: work Persons participating in the virtual visit: patient, Marine scientist.    I discussed the limitations, risks, security and privacy concerns of performing an evaluation and management service by telephone and the availability of in person appointments. The patient expressed understanding and verbally consented to this telephonic visit.    Interactive audio and video telecommunications were attempted between this provider and patient, however failed, due to patient having technical difficulties OR patient did not have access to video capability.  We continued and completed visit with audio only.  Some vital signs may be absent or patient reported.   Hearing/Vision screen  Hearing Screening   _0  _1  _2  _3  _4  _5  _6  _7  _8   Right ear:           Left ear:           Comments: Patient is able to hear conversational tones without difficulty. No issues reported.   Vision Screening Comments: Followed by Dr. Marica Otter  Wears corrective lenses  They have regular follow up with the ophthalmologist.  Dietary issues and exercise activities discussed: Current Exercise Habits: Home exercise routine, Type of exercise: walking, Time (Minutes): 30, Frequency (Times/Week): 7, Weekly Exercise (Minutes/Week): 210, Intensity: Mild Healthy diet Good water intake   Goals      Maintain Healthy Lifestyle     Healthy diet Exercise        Depression Screen PHQ 2/9 Scores 05/30/2020 05/30/2019 05/26/2018 05/25/2017 02/02/2017 05/19/2016 04/24/2015  PHQ - 2 Score 0 0 0 0 0 0 0  PHQ- 9 Score - - - 0 - - -    Fall Risk Fall Risk  05/30/2020 05/30/2019 05/30/2019 05/19/2019 05/03/2019  Falls in the past year? 0 - 0 0 0  Number falls in past yr: 0 - 0 0 -  Injury with Fall? 0 - - 0 -  Follow up Falls evaluation completed Falls evaluation completed Falls prevention discussed - -   Handrails in use when  climbing stairs? Yes Home free of loose throw rugs in walkways, pet beds, electrical cords, etc? Yes  Adequate lighting in your home to reduce risk of falls? Yes   ASSISTIVE DEVICES UTILIZED TO  PREVENT FALLS: Use of a cane, walker or w/c? No   TIMED UP AND GO: Was the test performed? No . Virtual visit.   Cognitive Function: Patient is alert and oriented x3.  Denies difficulty focusing, making decisions, memory loss.  Enjoys sewing, reading and floral arranging. MMSE/6CIT deferred. Normal by direct communication/observation.   MMSE - Mini Mental State Exam 05/19/2016  Orientation to time 5  Orientation to Place 5  Registration 3  Attention/ Calculation 5  Recall 3  Language- name 2 objects 2  Language- repeat 1  Language- follow 3 step command 3  Language- read & follow direction 1  Write a sentence 1  Copy design 1  Total score 30     6CIT Screen 05/26/2018 05/25/2017  What Year? 0 points 0 points  What month? 0 points 0 points  What time? 0 points 0 points  Count back from 20 0 points 0 points  Months in reverse 0 points 0 points  Repeat phrase 0 points 0 points  Total Score 0 0    Immunizations Immunization History  Administered Date(s) Administered   Influenza Whole 07/14/2007, 05/15/2009, 06/13/2010   Influenza, High Dose Seasonal PF 04/29/2017, 05/26/2018   Influenza,inj,Quad PF,6+ Mos 03/09/2013, 04/04/2014, 04/24/2015, 04/25/2016   Influenza-Unspecified 05/19/2011   PFIZER SARS-COV-2 Vaccination 08/04/2019, 08/25/2019   Pneumococcal Conjugate-13 03/15/2014   Pneumococcal Polysaccharide-23 05/25/2017   Tetanus 03/09/2013   Zoster 11/01/2007   Health Maintenance Health Maintenance  Topic Date Due   INFLUENZA VACCINE  10/11/2020 (Originally 02/12/2020)   MAMMOGRAM  04/04/2022   TETANUS/TDAP  03/10/2023   DEXA SCAN  Completed   COVID-19 Vaccine  Completed   Hepatitis C Screening  Completed   PNA vac Low Risk Adult  Completed   Colonoscopy- completed  2016.   Mammogram status: Completed 04/04/20. Repeat every year. 3D Screen, Bilateral.   Lung Cancer Screening: (Low Dose CT Chest recommended if Age 95-80 years, 30 pack-year currently smoking OR have quit w/in 15years.) does not qualify.   Hepatitis C Screening: Completed 10/11/6.  Vision Screening: Recommended annual ophthalmology exams for early detection of glaucoma and other disorders of the eye. Is the patient up to date with their annual eye exam?  Yes  Who is the provider or what is the name of the office in which the patient attends annual eye exams? Changing providers from Dr. Pauline Aus to Dr. Marica Otter.   Dental Screening: Recommended annual dental exams for proper oral hygiene  Community Resource Referral / Chronic Care Management: CRR required this visit?  No   CCM required this visit?  No      Plan:   Keep all routine maintenance appointments.   Follow up 06/22/20  I have personally reviewed and noted the following in the patient's chart:   Medical and social history Use of alcohol, tobacco or illicit drugs  Current medications and supplements Functional ability and status Nutritional status Physical activity Advanced directives List of other physicians Hospitalizations, surgeries, and ER visits in previous 12 months Vitals Screenings to include cognitive, depression, and falls Referrals and appointments  In addition, I have reviewed and discussed with patient certain preventive protocols, quality metrics, and best practice recommendations. A written personalized care plan for preventive services as well as general preventive health recommendations were provided to patient via mail.     OBrien-Blaney, Corleone Biegler L, LPN   [id number]    I have reviewed the above information and agree with above.   Deborra Medina, MD

## 2020-05-30 NOTE — Patient Instructions (Addendum)
Ms. Veronica Barron , Thank you for taking time to come for your Medicare Wellness Visit. I appreciate your ongoing commitment to your health goals. Please review the following plan we discussed and let me know if I can assist you in the future.   These are the goals we discussed: Goals    . Maintain Healthy Lifestyle     Healthy diet Exercise        This is a list of the screening recommended for you and due dates:  Health Maintenance  Topic Date Due  . Flu Shot  10/11/2020*  . Mammogram  04/04/2022  . Tetanus Vaccine  03/10/2023  . DEXA scan (bone density measurement)  Completed  . COVID-19 Vaccine  Completed  .  Hepatitis C: One time screening is recommended by Center for Disease Control  (CDC) for  adults born from [birth year] through 1966.   Completed  . Pneumonia vaccines  Completed  *Topic was postponed. The date shown is not the original due date.    Immunizations Immunization History  Administered Date(s) Administered  . Influenza Whole 07/14/2007, 05/15/2009, 06/13/2010  . Influenza, High Dose Seasonal PF 04/29/2017, 05/26/2018  . Influenza,inj,Quad PF,6+ Mos 03/09/2013, 04/04/2014, 04/24/2015, 04/25/2016  . Influenza-Unspecified 05/19/2011  . PFIZER SARS-COV-2 Vaccination 08/04/2019, 08/25/2019  . Pneumococcal Conjugate-13 03/15/2014  . Pneumococcal Polysaccharide-23 05/25/2017  . Tetanus 03/09/2013  . Zoster 11/01/2007   Keep all routine maintenance appointments.   Follow up 06/22/20  Advanced directives: completed, on file  Conditions/risks identified: none new  Next appointment: Follow up in one year for your annual wellness visit.   Preventive Care 66 Years and Older, Female Preventive care refers to lifestyle choices and visits with your health care provider that can promote health and wellness. What does preventive care include?  A yearly physical exam. This is also called an annual well check.  Dental exams once or twice a year.  Routine eye exams. Ask your  health care provider how often you should have your eyes checked.  Personal lifestyle choices, including:  Daily care of your teeth and gums.  Regular physical activity.  Eating a healthy diet.  Avoiding tobacco and drug use.  Limiting alcohol use.  Practicing safe sex.  Taking low-dose aspirin every day.  Taking vitamin and mineral supplements as recommended by your health care provider. What happens during an annual well check? The services and screenings done by your health care provider during your annual well check will depend on your age, overall health, lifestyle risk factors, and family history of disease. Counseling  Your health care provider may ask you questions about your:  Alcohol use.  Tobacco use.  Drug use.  Emotional well-being.  Home and relationship well-being.  Sexual activity.  Eating habits.  History of falls.  Memory and ability to understand (cognition).  Work and work Statistician.  Reproductive health. Screening  You may have the following tests or measurements:  Height, weight, and BMI.  Blood pressure.  Lipid and cholesterol levels. These may be checked every 5 years, or more frequently if you are over 10 years old.  Skin check.  Lung cancer screening. You may have this screening every year starting at age 77 if you have a 30-pack-year history of smoking and currently smoke or have quit within the past 15 years.  Fecal occult blood test (FOBT) of the stool. You may have this test every year starting at age 12.  Flexible sigmoidoscopy or colonoscopy. You may have a sigmoidoscopy every 5 years  or a colonoscopy every 10 years starting at age 52.  Hepatitis C blood test.  Hepatitis B blood test.  Sexually transmitted disease (STD) testing.  Diabetes screening. This is done by checking your blood sugar (glucose) after you have not eaten for a while (fasting). You may have this done every 1-3 years.  Bone density scan. This is  done to screen for osteoporosis. You may have this done starting at age 31.  Mammogram. This may be done every 1-2 years. Talk to your health care provider about how often you should have regular mammograms. Talk with your health care provider about your test results, treatment options, and if necessary, the need for more tests. Vaccines  Your health care provider may recommend certain vaccines, such as:  Influenza vaccine. This is recommended every year.  Tetanus, diphtheria, and acellular pertussis (Tdap, Td) vaccine. You may need a Td booster every 10 years.  Zoster vaccine. You may need this after age 7.  Pneumococcal 13-valent conjugate (PCV13) vaccine. One dose is recommended after age 26.  Pneumococcal polysaccharide (PPSV23) vaccine. One dose is recommended after age 57. Talk to your health care provider about which screenings and vaccines you need and how often you need them. This information is not intended to replace advice given to you by your health care provider. Make sure you discuss any questions you have with your health care provider. Document Released: 07/27/2015 Document Revised: 03/19/2016 Document Reviewed: 05/01/2015 Elsevier Interactive Patient Education  2017 [employer] Prevention in the Home Falls can cause injuries. They can happen to people of all ages. There are many things you can do to make your home safe and to help prevent falls. What can I do on the outside of my home?  Regularly fix the edges of walkways and driveways and fix any cracks.  Remove anything that might make you trip as you walk through a door, such as a raised step or threshold.  Trim any bushes or trees on the path to your home.  Use bright outdoor lighting.  Clear any walking paths of anything that might make someone trip, such as rocks or tools.  Regularly check to see if handrails are loose or broken. Make sure that both sides of any steps have handrails.  Any raised  decks and porches should have guardrails on the edges.  Have any leaves, snow, or ice cleared regularly.  Use sand or salt on walking paths during winter.  Clean up any spills in your garage right away. This includes oil or grease spills. What can I do in the bathroom?  Use night lights.  Install grab bars by the toilet and in the tub and shower. Do not use towel bars as grab bars.  Use non-skid mats or decals in the tub or shower.  If you need to sit down in the shower, use a plastic, non-slip stool.  Keep the floor dry. Clean up any water that spills on the floor as soon as it happens.  Remove soap buildup in the tub or shower regularly.  Attach bath mats securely with double-sided non-slip rug tape.  Do not have throw rugs and other things on the floor that can make you trip. What can I do in the bedroom?  Use night lights.  Make sure that you have a light by your bed that is easy to reach.  Do not use any sheets or blankets that are too big for your bed. They should not hang down  onto the floor.  Have a firm chair that has side arms. You can use this for support while you get dressed.  Do not have throw rugs and other things on the floor that can make you trip. What can I do in the kitchen?  Clean up any spills right away.  Avoid walking on wet floors.  Keep items that you use a lot in easy-to-reach places.  If you need to reach something above you, use a strong step stool that has a grab bar.  Keep electrical cords out of the way.  Do not use floor polish or wax that makes floors slippery. If you must use wax, use non-skid floor wax.  Do not have throw rugs and other things on the floor that can make you trip. What can I do with my stairs?  Do not leave any items on the stairs.  Make sure that there are handrails on both sides of the stairs and use them. Fix handrails that are broken or loose. Make sure that handrails are as long as the stairways.  Check  any carpeting to make sure that it is firmly attached to the stairs. Fix any carpet that is loose or worn.  Avoid having throw rugs at the top or bottom of the stairs. If you do have throw rugs, attach them to the floor with carpet tape.  Make sure that you have a light switch at the top of the stairs and the bottom of the stairs. If you do not have them, ask someone to add them for you. What else can I do to help prevent falls?  Wear shoes that:  Do not have high heels.  Have rubber bottoms.  Are comfortable and fit you well.  Are closed at the toe. Do not wear sandals.  If you use a stepladder:  Make sure that it is fully opened. Do not climb a closed stepladder.  Make sure that both sides of the stepladder are locked into place.  Ask someone to hold it for you, if possible.  Clearly mark and make sure that you can see:  Any grab bars or handrails.  First and last steps.  Where the edge of each step is.  Use tools that help you move around (mobility aids) if they are needed. These include:  Canes.  Walkers.  Scooters.  Crutches.  Turn on the lights when you go into a dark area. Replace any light bulbs as soon as they burn out.  Set up your furniture so you have a clear path. Avoid moving your furniture around.  If any of your floors are uneven, fix them.  If there are any pets around you, be aware of where they are.  Review your medicines with your doctor. Some medicines can make you feel dizzy. This can increase your chance of falling. Ask your doctor what other things that you can do to help prevent falls. This information is not intended to replace advice given to you by your health care provider. Make sure you discuss any questions you have with your health care provider. Document Released: 04/26/2009 Document Revised: 12/06/2015 Document Reviewed: 08/04/2014 Elsevier Interactive Patient Education  2017 [employer].

## 2020-06-12 ENCOUNTER — Other Ambulatory Visit: Payer: Self-pay

## 2020-06-12 MED ORDER — SERTRALINE HCL 50 MG PO TABS: 50.0000 mg | ORAL_TABLET | Freq: Every day | ORAL | 0 refills | Status: DC

## 2020-06-22 ENCOUNTER — Ambulatory Visit: Payer: Medicare Other | Admitting: Internal Medicine

## 2020-06-22 ENCOUNTER — Encounter: Payer: Self-pay | Admitting: Internal Medicine

## 2020-06-22 ENCOUNTER — Other Ambulatory Visit: Payer: Self-pay

## 2020-06-22 VITALS — BP 120/64 | HR 63 | Temp 97.7°F | Ht 63.0 in | Wt 152.0 lb

## 2020-06-22 DIAGNOSIS — M542 Cervicalgia: Secondary | ICD-10-CM

## 2020-06-22 DIAGNOSIS — E7800 Pure hypercholesterolemia, unspecified: Secondary | ICD-10-CM | POA: Diagnosis not present

## 2020-06-22 DIAGNOSIS — Z23 Encounter for immunization: Secondary | ICD-10-CM

## 2020-06-22 DIAGNOSIS — I10 Essential (primary) hypertension: Secondary | ICD-10-CM

## 2020-06-22 DIAGNOSIS — H9313 Tinnitus, bilateral: Secondary | ICD-10-CM

## 2020-06-22 DIAGNOSIS — R7303 Prediabetes: Secondary | ICD-10-CM

## 2020-06-22 DIAGNOSIS — Z7902 Long term (current) use of antithrombotics/antiplatelets: Secondary | ICD-10-CM

## 2020-06-22 DIAGNOSIS — G629 Polyneuropathy, unspecified: Secondary | ICD-10-CM

## 2020-06-22 DIAGNOSIS — E034 Atrophy of thyroid (acquired): Secondary | ICD-10-CM

## 2020-06-22 DIAGNOSIS — Z780 Asymptomatic menopausal state: Secondary | ICD-10-CM

## 2020-06-22 DIAGNOSIS — I255 Ischemic cardiomyopathy: Secondary | ICD-10-CM

## 2020-06-22 LAB — CBC WITH DIFFERENTIAL/PLATELET
Basophils Absolute: 0.1 10*3/uL (ref 0.0–0.1)
Basophils Relative: 1.3 % (ref 0.0–3.0)
Eosinophils Absolute: 0.2 10*3/uL (ref 0.0–0.7)
Eosinophils Relative: 3.1 % (ref 0.0–5.0)
HCT: 39.5 % (ref 36.0–46.0)
Hemoglobin: 12.7 g/dL (ref 12.0–15.0)
Lymphocytes Relative: 29 % (ref 12.0–46.0)
Lymphs Abs: 1.9 10*3/uL (ref 0.7–4.0)
MCHC: 32 g/dL (ref 30.0–36.0)
MCV: 80 fl (ref 78.0–100.0)
Monocytes Absolute: 0.4 10*3/uL (ref 0.1–1.0)
Monocytes Relative: 6.5 % (ref 3.0–12.0)
Neutro Abs: 4 10*3/uL (ref 1.4–7.7)
Neutrophils Relative %: 60.1 % (ref 43.0–77.0)
Platelets: 312 10*3/uL (ref 150.0–400.0)
RBC: 4.94 Mil/uL (ref 3.87–5.11)
RDW: 15.3 % (ref 11.5–15.5)
WBC: 6.6 10*3/uL (ref 4.0–10.5)

## 2020-06-22 LAB — COMPREHENSIVE METABOLIC PANEL
ALT: 14 U/L (ref 0–35)
AST: 19 U/L (ref 0–37)
Albumin: 4.2 g/dL (ref 3.5–5.2)
Alkaline Phosphatase: 53 U/L (ref 39–117)
BUN: 15 mg/dL (ref 6–23)
CO2: 28 mEq/L (ref 19–32)
Calcium: 9.5 mg/dL (ref 8.4–10.5)
Chloride: 102 mEq/L (ref 96–112)
Creatinine, Ser: 0.76 mg/dL (ref 0.40–1.20)
GFR: 74.22 mL/min (ref >60.00)
Glucose, Bld: 98 mg/dL (ref 70–99)
Potassium: 4.4 mEq/L (ref 3.5–5.1)
Sodium: 138 mEq/L (ref 135–145)
Total Bilirubin: 0.5 mg/dL (ref 0.2–1.2)
Total Protein: 6.6 g/dL (ref 6.0–8.3)

## 2020-06-22 LAB — LDL CHOLESTEROL, DIRECT: Direct LDL: 34 mg/dL

## 2020-06-22 LAB — TSH: TSH: 1.08 u[IU]/mL (ref 0.35–4.50)

## 2020-06-22 LAB — HEMOGLOBIN A1C: Hgb A1c MFr Bld: 6.4 % (ref 4.6–6.5)

## 2020-06-22 LAB — LIPID PANEL
Cholesterol: 110 mg/dL (ref 0–200)
HDL: 57.3 mg/dL (ref >39.00)
NonHDL: 52.86
Total CHOL/HDL Ratio: 2
Triglycerides: 212 mg/dL — ABNORMAL HIGH (ref 0.0–149.0)
VLDL: 42.4 mg/dL — ABNORMAL HIGH (ref 0.0–40.0)

## 2020-06-22 MED ORDER — ZOSTER VAC RECOMB ADJUVANTED 50 MCG/0.5ML IM SUSR: 0.5000 mL | Freq: Once | INTRAMUSCULAR | 1 refills | Status: AC

## 2020-06-22 NOTE — Patient Instructions (Addendum)
Talk to Dr [name] about switching lisinopril to telmisartan  Or losartan  . This will  reduce the cough (probably) and reduce the risk of adverse reaction  Reported by  ENT colleagues      Tinnitus Tinnitus refers to hearing a sound when there is no actual source for that sound. This is often described as ringing in the ears. However, people with this condition may hear a variety of noises, in one ear or in both ears. The sounds of tinnitus can be soft, loud, or somewhere in between. Tinnitus can last for a few seconds or can be constant for days. It may go away without treatment and come back at various times. When tinnitus is constant or happens often, it can lead to other problems, such as trouble sleeping and trouble concentrating. Almost everyone experiences tinnitus at some point. Tinnitus that is long-lasting (chronic) or comes back often (recurs) may require medical attention. What are the causes? The cause of tinnitus is often not known. In some cases, it can result from:  Exposure to loud noises from machinery, music, or other sources.  An object (foreign body) stuck in the ear.  Earwax buildup.  Drinking alcohol or caffeine.  Taking certain medicines.  Age-related hearing loss. It may also be caused by medical conditions such as:  Ear or sinus infections.  High blood pressure.  Heart diseases.  Anemia.  Allergies.  Meniere's disease.  Thyroid problems.  Tumors.  A weak, bulging blood vessel (aneurysm) near the ear. What are the signs or symptoms? The main symptom of tinnitus is hearing a sound when there is no source for that sound. It may sound like:  Buzzing.  Roaring.  Ringing.  Blowing air.  Hissing.  Whistling.  Sizzling.  Humming.  Running water.  A musical note.  Tapping. Symptoms may affect only one ear (unilateral) or both ears (bilateral). How is this diagnosed? Tinnitus is diagnosed based on your symptoms, your medical  history, and a physical exam. Your health care provider may do a thorough hearing test (audiologic exam) if your tinnitus:  Is unilateral.  Causes hearing difficulties.  Lasts 6 months or longer. You may work with a health care provider who specializes in hearing disorders (audiologist). You may be asked questions about your symptoms and how they affect your daily life. You may have other tests done, such as:  CT scan.  MRI.  An imaging test of how blood flows through your blood vessels (angiogram). How is this treated? Treating an underlying medical condition can sometimes make tinnitus go away. If your tinnitus continues, other treatments may include:  Medicines.  Therapy and counseling to help you manage the stress of living with tinnitus.  Sound generators to mask the tinnitus. These include: ? Tabletop sound machines that play relaxing sounds to help you fall asleep. ? Wearable devices that fit in your ear and play sounds or music. ? Acoustic neural stimulation. This involves using headphones to listen to music that contains an auditory signal. Over time, listening to this signal may change some pathways in your brain and make you less sensitive to tinnitus. This treatment is used for very severe cases when no other treatment is working.  Using hearing aids or cochlear implants if your tinnitus is related to hearing loss. Hearing aids are worn in the outer ear. Cochlear implants are surgically placed in the inner ear. Follow these instructions at home: Managing symptoms      When possible, avoid being in loud  places and being exposed to loud sounds.  Wear hearing protection, such as earplugs, when you are exposed to loud noises.  Use a white noise machine, a humidifier, or other devices to mask the sound of tinnitus.  Practice techniques for reducing stress, such as meditation, yoga, or deep breathing. Work with your health care provider if you need help with managing  stress.  Sleep with your head slightly raised. This may reduce the impact of tinnitus. General instructions  Do not use stimulants, such as nicotine, alcohol, or caffeine. Talk with your health care provider about other stimulants to avoid. Stimulants are substances that can make you feel alert and attentive by increasing certain activities in the body (such as heart rate and blood pressure). These substances may make tinnitus worse.  Take over-the-counter and prescription medicines only as told by your health care provider.  Try to get plenty of sleep each night.  Keep all follow-up visits as told by your health care provider. This is important. Contact a health care provider if:  Your tinnitus continues for 3 weeks or longer without stopping.  You develop sudden hearing loss.  Your symptoms get worse or do not get better with home care.  You feel you are not able to manage the stress of living with tinnitus. Get help right away if:  You develop tinnitus after a head injury.  You have tinnitus along with any of the following: ? Dizziness. ? Loss of balance. ? Nausea and vomiting. ? Sudden, severe headache. These symptoms may represent a serious problem that is an emergency. Do not wait to see if the symptoms will go away. Get medical help right away. Call your local emergency services (911 in the [place] Do not drive yourself to the hospital. Summary  Tinnitus refers to hearing a sound when there is no actual source for that sound. This is often described as ringing in the ears.  Symptoms may affect only one ear (unilateral) or both ears (bilateral).  Use a white noise machine, a humidifier, or other devices to mask the sound of tinnitus.  Do not use stimulants, such as nicotine, alcohol, or caffeine. Talk with your health care provider about other stimulants to avoid. These substances may make tinnitus worse. This information is not intended to replace advice given to you by  your health care provider. Make sure you discuss any questions you have with your health care provider. Document Revised: 01/12/2019 Document Reviewed: 04/09/2017 Elsevier Patient Education  2020 [employer].

## 2020-06-22 NOTE — Progress Notes (Signed)
Subjective:  Patient ID: Veronica Barron, female    DOB: 1940/11/18  Age: 79 y.o. MRN: [id number]  CC: The primary encounter diagnosis was Prediabetes. Diagnoses of Hypothyroidism due to acquired atrophy of thyroid, Pure hypercholesterolemia, Encounter for current long term use of antiplatelet drug, Postmenopausal estrogen deficiency, Need for immunization against influenza, Cardiomyopathy, ischemic, Neuropathy, Essential hypertension, Tinnitus aurium, bilateral, and Neck pain were also pertinent to this visit.  HPI HIROKO TREGRE presents for follow up on chronic conditions including CAD, prediabetes, hypothyroidism an hyperlipidemia.  This visit occurred during the SARS-CoV-2 public health emergency.  Safety protocols were in place, including screening questions prior to the visit, additional usage of staff PPE, and extensive cleaning of exam room while observing appropriate contact time as indicated for disinfecting solutions.    Since hr last visit her cardiologist has discontinued plavix.    She has several complaints:    1)  "buzzing sound in my head,"  Not constant .Not associated with a headache,   Hears it mostly at night when She has not had a formal  hearing test .  Hearing intact to whispered voice behind mask.    2) Neck pain.  Non radiating. No history of falls .  Activities reviewed:  She  spends  3-4 hours daily sewing  While sitting on her cough with  neck flexed .  Staying very  Active.   Does yardwork  .  Walks daily   Hyperlipidemia:  taking repatha due to statin myalgias .      Outpatient Medications Prior to Visit  Medication Sig Dispense Refill  . ALFALFA PO Take 1 tablet by mouth daily.    Marland Kitchen aspirin EC 81 MG tablet Take 81 mg by mouth daily.    . carvedilol (COREG) 6.25 MG tablet TAKE ONE TABLET BY MOUTH TWICE DAILY WITH MEALS 180 tablet 2  . Evolocumab (REPATHA SURECLICK) 623 MG/ML SOAJ Inject 140 mg into the skin every 14 (fourteen) days. 2 pen 12  .  levothyroxine (SYNTHROID) 88 MCG tablet TAKE 1 TABLET BY MOUTH DAILY BEFORE BREAKFAST. 90 tablet 1  . lisinopril (ZESTRIL) 5 MG tablet Take 1 tablet (5 mg total) by mouth 2 (two) times daily. 180 tablet 2  . multivitamin-lutein (OCUVITE-LUTEIN) CAPS capsule Take 1 capsule by mouth daily.    . sertraline (ZOLOFT) 50 MG tablet Take 1 tablet (50 mg total) by mouth daily. 90 tablet 0  . vitamin B-12 (CYANOCOBALAMIN) 500 MCG tablet Take 500 mcg by mouth daily.    . vitamin C (ASCORBIC ACID) 500 MG tablet Take 500 mg by mouth daily.    . clopidogrel (PLAVIX) 75 MG tablet Take 1 tablet (75 mg total) by mouth daily. (Patient not taking: Reported on 06/22/2020) 90 tablet 1  . clorazepate (TRANXENE-T) 7.5 MG tablet Take 1 tablet (7.5 mg total) by mouth at bedtime as needed for anxiety. (Patient not taking: Reported on 06/22/2020) 30 tablet 0  . indomethacin (INDOCIN) 50 MG capsule Take 1 capsule (50 mg total) by mouth 3 (three) times daily with meals. (Patient not taking: Reported on 06/22/2020) 30 capsule 0   No facility-administered medications prior to visit.    Review of Systems;  Patient denies headache, fevers, malaise, unintentional weight loss, skin rash, eye pain, sinus congestion and sinus pain, sore throat, dysphagia,  hemoptysis , cough, dyspnea, wheezing, chest pain, palpitations, orthopnea, edema, abdominal pain, nausea, melena, diarrhea, constipation, flank pain, dysuria, hematuria, urinary  Frequency, nocturia, numbness, tingling, seizures,  Focal weakness,  Loss of consciousness,  Tremor, insomnia, depression, anxiety, and suicidal ideation.      Objective:  BP 120/64   Pulse 63   Temp 97.7 F (36.5 C)   Ht 5\' 3"  (1.6 m)   Wt 152 lb (68.9 kg)   SpO2 99%   BMI 26.93 kg/m   BP Readings from Last 3 Encounters:  06/22/20 120/64  05/30/20 139/65  02/07/20 (!) 150/82    Wt Readings from Last 3 Encounters:  06/22/20 152 lb (68.9 kg)  05/30/20 153 lb (69.4 kg)  02/07/20 153 lb  (69.4 kg)    General appearance: alert, cooperative and appears stated age Ears: normal TM's and external ear canals both ears Throat: lips, mucosa, and tongue normal; teeth and gums normal Neck: no adenopathy, no carotid bruit, supple, symmetrical, trachea midline and thyroid not enlarged, symmetric, no tenderness/mass/nodules Back: symmetric, no curvature. ROM normal. No CVA tenderness. Lungs: clear to auscultation bilaterally Heart: regular rate and rhythm, S1, S2 normal, no murmur, click, rub or gallop Abdomen: soft, non-tender; bowel sounds normal; no masses,  no organomegaly Pulses: 2+ and symmetric Skin: Skin color, texture, turgor normal. No rashes or lesions Lymph nodes: Cervical, supraclavicular, and axillary nodes normal.  Lab Results  Component Value Date   HGBA1C 6.4 06/22/2020   HGBA1C 6.1 06/17/2019   HGBA1C 6.4 01/07/2019    Lab Results  Component Value Date   CREATININE 0.76 06/22/2020   CREATININE 0.77 06/17/2019   CREATININE 0.64 04/27/2019    Lab Results  Component Value Date   WBC 6.6 06/22/2020   HGB 12.7 06/22/2020   HCT 39.5 06/22/2020   PLT 312.0 06/22/2020   GLUCOSE 98 06/22/2020   CHOL 110 06/22/2020   TRIG 212.0 (H) 06/22/2020   HDL 57.30 06/22/2020   LDLDIRECT 34.0 06/22/2020   LDLCALC 37 06/17/2019   ALT 14 06/22/2020   AST 19 06/22/2020   NA 138 06/22/2020   K 4.4 06/22/2020   CL 102 06/22/2020   CREATININE 0.76 06/22/2020   BUN 15 06/22/2020   CO2 28 06/22/2020   TSH 1.08 06/22/2020   INR 0.92 05/17/2014   HGBA1C 6.4 06/22/2020   MICROALBUR <0.7 06/17/2019    MM 3D SCREEN BREAST BILATERAL  Result Date: 04/05/2020 CLINICAL DATA:  Screening. EXAM: DIGITAL SCREENING BILATERAL MAMMOGRAM WITH TOMO AND CAD COMPARISON:  Previous exam(s). ACR Breast Density Category c: The breast tissue is heterogeneously dense, which may obscure small masses. FINDINGS: There are no findings suspicious for malignancy. Images were processed with CAD.  IMPRESSION: No mammographic evidence of malignancy. A result letter of this screening mammogram will be mailed directly to the patient. RECOMMENDATION: Screening mammogram in one year. (Code:SM-B-01Y) BI-RADS CATEGORY  1: Negative. Electronically Signed   By: Everlean Alstrom M.D.   On: 04/05/2020 16:03    Assessment & Plan:   Problem List Items Addressed This Visit      Unprioritized   Hypothyroidism    Thyroid function is WNL on current dose.  No current changes needed.   Lab Results  Component Value Date   TSH 1.08 06/22/2020         Relevant Orders   TSH (Completed)   Essential hypertension    Well controlled on current regimen. Renal function stable, no changes today.  Lab Results  Component Value Date   CREATININE 0.76 06/22/2020         Cardiomyopathy, ischemic    She remains asymptomatic at rest .  Plavix has been discontinued  Hyperlipidemia    Managed with Repatha due to statin myalgias.  LDL is well below 70   Lab Results  Component Value Date   CHOL 110 06/22/2020   HDL 57.30 06/22/2020   LDLCALC 37 06/17/2019   LDLDIRECT 34.0 06/22/2020   TRIG 212.0 (H) 06/22/2020   CHOLHDL 2 06/22/2020         Relevant Orders   Lipid panel (Completed)   Prediabetes - Primary    Her  random and fasting glucoses have not been  Diagnostic  but her A1c suggests she is very close to diagnosis .  I recommend she follow a low glycemic index diet and particpate regularly in an aerobic  exercise activity.    Lab Results  Component Value Date   HGBA1C 6.4 06/22/2020           Relevant Orders   Comprehensive metabolic panel (Completed)   Hemoglobin A1c (Completed)   Neuropathy    Etiology may be thyroid vs diabetes.  Not progressive       Tinnitus aurium, bilateral    Her hearing is intact to whispered voice and she defers formal evaluation.  She has no other neurologic symptoms      Neck pain    Non radicular,  likely due to prolonged neck flexion  during sewing activities        Other Visit Diagnoses    Encounter for current long term use of antiplatelet drug       Relevant Orders   CBC with Differential/Platelet (Completed)   Postmenopausal estrogen deficiency       Relevant Orders   DG Bone Density   Need for immunization against influenza       Relevant Orders   Flu Vaccine QUAD High Dose(Fluad) (Completed)      I provided  30 minutes of  face-to-face time during this encounter reviewing patient's current problems and past surgeries, labs and imaging studies, providing counseling on the above mentioned problems , and coordination  of care . I have discontinued Daphna A. Lisowski's clorazepate, indomethacin, and clopidogrel. I am also having her start on Zoster Vaccine Adjuvanted. Additionally, I am having her maintain her vitamin C, aspirin EC, multivitamin-lutein, ALFALFA PO, vitamin B-12, lisinopril, levothyroxine, Repatha SureClick, carvedilol, and sertraline.  Meds ordered this encounter  Medications  . Zoster Vaccine Adjuvanted [facility]) injection    Sig: Inject 0.5 mLs into the muscle once for 1 dose.    Dispense:  1 each    Refill:  1    Medications Discontinued During This Encounter  Medication Reason  . clopidogrel (PLAVIX) 75 MG tablet Discontinued by provider  . clorazepate (TRANXENE-T) 7.5 MG tablet No longer needed (for PRN medications)  . indomethacin (INDOCIN) 50 MG capsule One time medication    Follow-up: No follow-ups on file.   [name], MD

## 2020-06-24 DIAGNOSIS — M542 Cervicalgia: Secondary | ICD-10-CM | POA: Insufficient documentation

## 2020-06-24 DIAGNOSIS — H9313 Tinnitus, bilateral: Secondary | ICD-10-CM | POA: Insufficient documentation

## 2020-06-24 NOTE — Assessment & Plan Note (Signed)
Non radicular,  likely due to prolonged neck flexion during sewing activities

## 2020-06-24 NOTE — Assessment & Plan Note (Signed)
Etiology may be thyroid vs diabetes.  Not progressive

## 2020-06-24 NOTE — Assessment & Plan Note (Signed)
She remains asymptomatic at rest .  Plavix has been discontinued

## 2020-06-24 NOTE — Assessment & Plan Note (Signed)
Her hearing is intact to whispered voice and she defers formal evaluation.  She has no other neurologic symptoms

## 2020-06-24 NOTE — Assessment & Plan Note (Signed)
Her  random and fasting glucoses have not been  Diagnostic  but her A1c suggests she is very close to diagnosis .  I recommend she follow a low glycemic index diet and particpate regularly in an aerobic  exercise activity.    Lab Results  Component Value Date   HGBA1C 6.4 06/22/2020

## 2020-06-24 NOTE — Assessment & Plan Note (Signed)
Managed with Repatha due to statin myalgias.  LDL is well below 70   Lab Results  Component Value Date   CHOL 110 06/22/2020   HDL 57.30 06/22/2020   LDLCALC 37 06/17/2019   LDLDIRECT 34.0 06/22/2020   TRIG 212.0 (H) 06/22/2020   CHOLHDL 2 06/22/2020

## 2020-06-24 NOTE — Assessment & Plan Note (Signed)
Thyroid function is WNL on current dose.  No current changes needed.   Lab Results  Component Value Date   TSH 1.08 06/22/2020

## 2020-06-24 NOTE — Assessment & Plan Note (Signed)
Well controlled on current regimen. Renal function stable, no changes today.  Lab Results  Component Value Date   CREATININE 0.76 06/22/2020

## 2020-06-24 NOTE — Progress Notes (Signed)
Your CBC, thyroid ,cholesterol,  liver and kidney function are normal.  You are not anemic  but you are still at risk for diabetes, so be moderate over the holidays and make sure to stay active. !   Please Plan to repeat fasting labs in 6 months.   Regards,  Dr. Derrel Nip

## 2020-06-26 ENCOUNTER — Telehealth: Payer: Self-pay | Admitting: Cardiology

## 2020-06-26 NOTE — Telephone Encounter (Signed)
Pt c/o medication issue:  1. Name of Medication: Plavix   2. How are you currently taking this medication (dosage and times per day)? Pt stated she was then off of this med  3. Are you having a reaction (difficulty breathing--STAT)? A little Dizzy   4. What is your medication issue? Pt called in and stated that she has been a little dizzy .  She would like to know if Dr [name] thinks coming off this med could cause her to be a little dizzy   Best number  [phone]

## 2020-06-26 NOTE — Telephone Encounter (Signed)
I don't think the Plavix would have anything to do with these symptoms. I would have her follow through with primary care  Ferrell Flam [name] MD, [facility]

## 2020-06-26 NOTE — Telephone Encounter (Signed)
Spoke to patient, reports concerns with dizziness the last 2-3 weeks.   States she notices the dizziness and "buzzing in her ear" mostly at night when she is laying down in bed.   Does not occur all the time.   No hx of vertigo.  Denies palpitations/SOB/CP/lightheadedness/near syncope or syncopal episodes.   Reports BP and HR being "good".   States it was 120/60s at recent appt.   She states she stopped the plavix in August or September and was wondering if this would have anything to do with it.  Advised would not suspect this to contribute to symptoms as it has been a few months.    Denies other medication changes.   She saw her PCP 12/10 (in epic), per patient report PCP advised her to let her know if this continued or worsens.    Advised would route to MD to review but advised to continue to follow up with PCP in regards to this.  Patient verbalized understanding.

## 2020-06-27 ENCOUNTER — Other Ambulatory Visit: Payer: Self-pay

## 2020-06-27 MED ORDER — LISINOPRIL 5 MG PO TABS: 5.0000 mg | ORAL_TABLET | Freq: Two times a day (BID) | ORAL | 0 refills | Status: DC

## 2020-06-27 NOTE — Telephone Encounter (Signed)
Spoke to patient Dr.Jordan's advice given. 

## 2020-06-29 ENCOUNTER — Other Ambulatory Visit: Payer: Self-pay | Admitting: Internal Medicine

## 2020-06-29 DIAGNOSIS — E034 Atrophy of thyroid (acquired): Secondary | ICD-10-CM

## 2020-06-29 DIAGNOSIS — R7303 Prediabetes: Secondary | ICD-10-CM

## 2020-06-29 DIAGNOSIS — I10 Essential (primary) hypertension: Secondary | ICD-10-CM

## 2020-06-29 DIAGNOSIS — E7800 Pure hypercholesterolemia, unspecified: Secondary | ICD-10-CM

## 2020-07-23 ENCOUNTER — Other Ambulatory Visit: Payer: Self-pay

## 2020-07-23 MED ORDER — LEVOTHYROXINE SODIUM 88 MCG PO TABS: ORAL_TABLET | ORAL | 1 refills | Status: DC

## 2020-07-23 NOTE — Progress Notes (Signed)
Veronica Barron Date of Birth: [birth year] Medical Record #[id number]  History of Present Illness: Veronica Barron is seen today for followup CAD. She has a history of hypertension, hyperlipidemia, and hypothyroidism. In Sept 2014 she had stenting of the proximal LAD with DES.  The distal left circumflex was occluded with left to left collaterals and right-to-left collaterals. The right coronary was without significant disease. Ejection fraction was 30%.  Her other disease was treated medically. On followup Echo in Dec. 2014 EF had improved to 50-55%. In November 2015 she had a significant episode of chest pain. Repeat cardiac cath showed occlusion of the distal LCx and the stent in the LAD was patent.   She has a history of intolerance to multiple statins, Zetia and Lopid, and fenofibrates. She was previously unable afford a PCSK 9 inhibitor and was seen by Pharm D and it was authorized and she is now on it.    She was admitted in October 2020 with acute appendicitis. She had appy without complications.   On follow up today she reports she is feeling very well. No chest pain or SOB. She is still quite active with her walking. Good energy. Tolerating medications well.    Current Outpatient Medications on File Prior to Visit  Medication Sig Dispense Refill  . ALFALFA PO Take 1 tablet by mouth daily.    Marland Kitchen aspirin EC 81 MG tablet Take 81 mg by mouth daily.    . carvedilol (COREG) 6.25 MG tablet TAKE ONE TABLET BY MOUTH TWICE DAILY WITH MEALS 180 tablet 2  . Evolocumab (REPATHA SURECLICK) 638 MG/ML SOAJ Inject 140 mg into the skin every 14 (fourteen) days. 2 pen 12  . levothyroxine (SYNTHROID) 88 MCG tablet TAKE 1 TABLET BY MOUTH DAILY BEFORE BREAKFAST. 90 tablet 1  . lisinopril (ZESTRIL) 5 MG tablet Take 1 tablet (5 mg total) by mouth 2 (two) times daily. 60 tablet 0  . multivitamin-lutein (OCUVITE-LUTEIN) CAPS capsule Take 1 capsule by mouth daily.    . sertraline (ZOLOFT) 50 MG tablet Take 1 tablet  (50 mg total) by mouth daily. 90 tablet 0  . vitamin B-12 (CYANOCOBALAMIN) 500 MCG tablet Take 500 mcg by mouth daily.    . vitamin C (ASCORBIC ACID) 500 MG tablet Take 500 mg by mouth daily.     No current facility-administered medications on file prior to visit.    Allergies  Allergen Reactions  . Diphenhydramine Hcl     REACTION: TACHYCARDIA  . Fenofibrate Other (See Comments)    constipation  . Statins     Myalgia   . Zetia [Ezetimibe] Other (See Comments)    Myalgias     Past Medical History:  Diagnosis Date  . Allergic rhinitis, cause unspecified   . CAD (coronary artery disease)    a. 03/2013 Abnl MV, EF 29%;  b. 03/2013 Cath/PCI: >M nl, LAD 99p (2.5x28 Promus Prem DES), LCX 100 after large OM1, RCA dominant, 20p, EF 30%.  . Diverticulosis of colon   . Hyperlipidemia   . Hypertension   . Ischemic cardiomyopathy    a. 03/2013 EF 30% by LV gram.;  b. Echo (12/14): Mod LVH with severe basal septal hypertrophy w/o LVOT obstr, EF 50-55%, normal wall motion, Gr 1 DD, MAC, mild MR, mild LAE, trivial eff.  . Leiomyoma of uterus, unspecified   . Unspecified hypothyroidism     Past Surgical History:  Procedure Laterality Date  . ABDOMINAL HYSTERECTOMY     fibroid   at  age 43  . CHOLECYSTECTOMY    . LAPAROSCOPIC APPENDECTOMY N/A 04/26/2019   Procedure: APPENDECTOMY LAPAROSCOPIC;  Surgeon: Jules Husbands, MD;  Location: [facility] ORS;  Service: General;  Laterality: N/A;  . LEFT HEART CATHETERIZATION WITH CORONARY ANGIOGRAM N/A 03/16/2013   Procedure: LEFT HEART CATHETERIZATION WITH CORONARY ANGIOGRAM;  Surgeon: Ayeshia Coppin M [name], MD;  Location: [facility] CATH LAB;  Service: Cardiovascular;  Laterality: N/A;  . LEFT HEART CATHETERIZATION WITH CORONARY ANGIOGRAM N/A 05/23/2014   Procedure: LEFT HEART CATHETERIZATION WITH CORONARY ANGIOGRAM;  Surgeon: Owain Eckerman M [name], MD;  Location: [facility] CATH LAB;  Service: Cardiovascular;  Laterality: N/A;  . PERCUTANEOUS CORONARY STENT INTERVENTION (PCI-S)   03/16/2013   Procedure: PERCUTANEOUS CORONARY STENT INTERVENTION (PCI-S);  Surgeon: Camarion Weier M [name], MD;  Location: [facility] CATH LAB;  Service: Cardiovascular;;  . TUBAL LIGATION      Social History   Tobacco Use  Smoking Status Never Smoker  Smokeless Tobacco Never Used    Social History   Substance and Sexual Activity  Alcohol Use Yes   Comment: wine    Family History  Problem Relation Age of Onset  . Dementia Mother   . Hyperlipidemia Mother   . Hypertension Mother   . Coronary artery disease Father   . Hypertension Father   . Hyperlipidemia Father   . Uterine cancer Sister 48  . Breast cancer Maternal Aunt     Review of Systems: As noted in history of present illness.  All other systems were reviewed and are negative.  Physical Exam: BP 136/88   Pulse 74   Ht _0  (1.6 m)   Wt 153 lb (69.4 kg)   SpO2 96%   BMI 27.10 kg/m  GENERAL:  Well appearing WF in NAD HEENT:  PERRL, EOMI, sclera are clear. Oropharynx is clear. NECK:  No jugular venous distention, carotid upstroke brisk and symmetric, no bruits, no thyromegaly or adenopathy LUNGS:  Clear to auscultation bilaterally CHEST:  Unremarkable HEART:  RRR,  PMI not displaced or sustained,S1 and S2 within normal limits, no S3, no S4: no clicks, no rubs, no murmurs ABD:  Soft, nontender. BS +, no masses or bruits. No hepatomegaly, no splenomegaly EXT:  2 + pulses throughout, no edema, no cyanosis no clubbing SKIN:  Warm and dry.  No rashes NEURO:  Alert and oriented x 3. Cranial nerves II through XII intact. 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 Twave inversion laterally. No change.  Rate 62. I have personally reviewed and interpreted this study.  Assessment / Plan: 1. Coronary disease status post stenting of the proximal LAD in September 2014 with drug-eluting stent. 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 Cardiac cath in November 2015 showed continued stent patency.  She is asymptomatic.  Continue current therapy. On ASA, Coreg and ACEi.   2. Hyperlipidemia. History of intolerance to multiple statins, Zetia, fenofibrates, and Lopid. Now on Repatha with marked reduction in LDL to 37. Tolerating well.   3. Hypertension- good control. Continue Rx.  4. Hypothyroidism. TSH normal.   Follow up in 6 months

## 2020-07-26 ENCOUNTER — Encounter: Payer: Self-pay | Admitting: Internal Medicine

## 2020-07-27 ENCOUNTER — Encounter: Payer: Self-pay | Admitting: Cardiology

## 2020-07-27 ENCOUNTER — Other Ambulatory Visit: Payer: Self-pay

## 2020-07-27 ENCOUNTER — Ambulatory Visit: Payer: Medicare Other | Admitting: Cardiology

## 2020-07-27 VITALS — BP 136/88 | HR 74 | Ht 63.0 in | Wt 153.0 lb

## 2020-07-27 DIAGNOSIS — I2510 Atherosclerotic heart disease of native coronary artery without angina pectoris: Secondary | ICD-10-CM | POA: Diagnosis not present

## 2020-07-27 DIAGNOSIS — E7800 Pure hypercholesterolemia, unspecified: Secondary | ICD-10-CM | POA: Diagnosis not present

## 2020-07-27 DIAGNOSIS — I10 Essential (primary) hypertension: Secondary | ICD-10-CM | POA: Diagnosis not present

## 2020-08-01 ENCOUNTER — Telehealth: Payer: Self-pay | Admitting: Cardiology

## 2020-08-01 ENCOUNTER — Other Ambulatory Visit: Payer: Self-pay

## 2020-08-01 MED ORDER — LEVOTHYROXINE SODIUM 88 MCG PO TABS: ORAL_TABLET | ORAL | 1 refills | Status: DC

## 2020-08-01 MED ORDER — LISINOPRIL 5 MG PO TABS: 5.0000 mg | ORAL_TABLET | Freq: Two times a day (BID) | ORAL | 0 refills | Status: DC

## 2020-08-01 NOTE — Telephone Encounter (Signed)
Patient called in stated that [facility] stated that they have not received  her refill for levothyroxine (SYNTHROID) 88 MCG tablet

## 2020-08-01 NOTE — Telephone Encounter (Signed)
*  STAT* If patient is at the pharmacy, call can be transferred to refill team.   1. Which medications need to be refilled? (please list name of each medication and dose if known)  lisinopril (ZESTRIL) 5 MG tablet  2. Which pharmacy/location (including street and city if local pharmacy) is medication to be sent to? [facility] [place], [place]  3. Do they need a 30 day or 90 day supply? 90 day  Patient is out of medication

## 2020-08-07 DIAGNOSIS — H25819 Combined forms of age-related cataract, unspecified eye: Secondary | ICD-10-CM | POA: Diagnosis not present

## 2020-08-07 DIAGNOSIS — H52223 Regular astigmatism, bilateral: Secondary | ICD-10-CM | POA: Diagnosis not present

## 2020-08-07 DIAGNOSIS — H25813 Combined forms of age-related cataract, bilateral: Secondary | ICD-10-CM | POA: Diagnosis not present

## 2020-08-07 DIAGNOSIS — H5213 Myopia, bilateral: Secondary | ICD-10-CM | POA: Diagnosis not present

## 2020-08-17 ENCOUNTER — Other Ambulatory Visit: Payer: Self-pay

## 2020-08-17 MED ORDER — SERTRALINE HCL 50 MG PO TABS: 50.0000 mg | ORAL_TABLET | Freq: Every day | ORAL | 0 refills | Status: DC

## 2020-08-20 ENCOUNTER — Other Ambulatory Visit: Payer: Self-pay

## 2020-08-20 MED ORDER — LISINOPRIL 5 MG PO TABS: 5.0000 mg | ORAL_TABLET | Freq: Two times a day (BID) | ORAL | 11 refills | Status: DC

## 2020-08-22 ENCOUNTER — Telehealth: Payer: Self-pay | Admitting: Internal Medicine

## 2020-08-22 MED ORDER — SERTRALINE HCL 50 MG PO TABS: 50.0000 mg | ORAL_TABLET | Freq: Every day | ORAL | 0 refills | Status: DC

## 2020-08-22 NOTE — Telephone Encounter (Signed)
[facility] mail order pharmacy is requesting the following refill; sertraline (ZOLOFT) 50 MG tablet.

## 2020-09-18 DIAGNOSIS — H2513 Age-related nuclear cataract, bilateral: Secondary | ICD-10-CM | POA: Diagnosis not present

## 2020-09-18 DIAGNOSIS — H18413 Arcus senilis, bilateral: Secondary | ICD-10-CM | POA: Diagnosis not present

## 2020-09-18 DIAGNOSIS — H353131 Nonexudative age-related macular degeneration, bilateral, early dry stage: Secondary | ICD-10-CM | POA: Diagnosis not present

## 2020-09-18 DIAGNOSIS — H35373 Puckering of macula, bilateral: Secondary | ICD-10-CM | POA: Diagnosis not present

## 2020-11-20 ENCOUNTER — Telehealth: Payer: Self-pay

## 2020-11-20 NOTE — Telephone Encounter (Signed)
Pt needs refill of sertraline (ZOLOFT) 50 MG tablet. PLEASE SEND TO [facility] as it is cheaper for patient

## 2020-11-21 MED ORDER — SERTRALINE HCL 50 MG PO TABS: 50.0000 mg | ORAL_TABLET | Freq: Every day | ORAL | 0 refills | Status: DC

## 2020-12-25 ENCOUNTER — Other Ambulatory Visit: Payer: Self-pay

## 2020-12-25 ENCOUNTER — Other Ambulatory Visit (INDEPENDENT_AMBULATORY_CARE_PROVIDER_SITE_OTHER): Payer: Medicare Other

## 2020-12-25 DIAGNOSIS — E785 Hyperlipidemia, unspecified: Secondary | ICD-10-CM

## 2020-12-25 DIAGNOSIS — E034 Atrophy of thyroid (acquired): Secondary | ICD-10-CM | POA: Diagnosis not present

## 2020-12-25 DIAGNOSIS — R7303 Prediabetes: Secondary | ICD-10-CM

## 2020-12-25 DIAGNOSIS — E7800 Pure hypercholesterolemia, unspecified: Secondary | ICD-10-CM | POA: Diagnosis not present

## 2020-12-25 DIAGNOSIS — E1169 Type 2 diabetes mellitus with other specified complication: Secondary | ICD-10-CM

## 2020-12-25 DIAGNOSIS — I10 Essential (primary) hypertension: Secondary | ICD-10-CM

## 2020-12-25 LAB — TSH: TSH: 0.75 u[IU]/mL (ref 0.35–4.50)

## 2020-12-25 LAB — LIPID PANEL
Cholesterol: 110 mg/dL (ref 0–200)
HDL: 53.4 mg/dL (ref >39.00)
NonHDL: 57.04
Total CHOL/HDL Ratio: 2
Triglycerides: 205 mg/dL — ABNORMAL HIGH (ref 0.0–149.0)
VLDL: 41 mg/dL — ABNORMAL HIGH (ref 0.0–40.0)

## 2020-12-25 LAB — COMPREHENSIVE METABOLIC PANEL
ALT: 15 U/L (ref 0–35)
AST: 19 U/L (ref 0–37)
Albumin: 4.2 g/dL (ref 3.5–5.2)
Alkaline Phosphatase: 58 U/L (ref 39–117)
BUN: 14 mg/dL (ref 6–23)
CO2: 27 mEq/L (ref 19–32)
Calcium: 9.2 mg/dL (ref 8.4–10.5)
Chloride: 102 mEq/L (ref 96–112)
Creatinine, Ser: 0.77 mg/dL (ref 0.40–1.20)
GFR: 72.8 mL/min (ref >60.00)
Glucose, Bld: 111 mg/dL — ABNORMAL HIGH (ref 70–99)
Potassium: 4.4 mEq/L (ref 3.5–5.1)
Sodium: 139 mEq/L (ref 135–145)
Total Bilirubin: 0.5 mg/dL (ref 0.2–1.2)
Total Protein: 6.6 g/dL (ref 6.0–8.3)

## 2020-12-25 LAB — HEMOGLOBIN A1C: Hgb A1c MFr Bld: 6.6 % — ABNORMAL HIGH (ref 4.6–6.5)

## 2020-12-25 LAB — LDL CHOLESTEROL, DIRECT: Direct LDL: 34 mg/dL

## 2020-12-27 ENCOUNTER — Telehealth: Payer: Self-pay

## 2020-12-27 NOTE — Assessment & Plan Note (Addendum)
New onset,  With Y3F now 6.6   Complicated by CAD and hypertension as well. She has a history of Statin intolerance by prior trials.  appt needed

## 2020-12-27 NOTE — Telephone Encounter (Signed)
LMTCB in regards to lab results.  

## 2021-01-02 ENCOUNTER — Other Ambulatory Visit: Payer: Self-pay | Admitting: Internal Medicine

## 2021-01-02 DIAGNOSIS — H2513 Age-related nuclear cataract, bilateral: Secondary | ICD-10-CM | POA: Diagnosis not present

## 2021-01-02 DIAGNOSIS — H35373 Puckering of macula, bilateral: Secondary | ICD-10-CM | POA: Diagnosis not present

## 2021-01-02 DIAGNOSIS — H25013 Cortical age-related cataract, bilateral: Secondary | ICD-10-CM | POA: Diagnosis not present

## 2021-01-02 DIAGNOSIS — H353131 Nonexudative age-related macular degeneration, bilateral, early dry stage: Secondary | ICD-10-CM | POA: Diagnosis not present

## 2021-01-07 HISTORY — PX: CATARACT EXTRACTION W/ INTRAOCULAR LENS IMPLANT: SHX1309

## 2021-01-15 DIAGNOSIS — H2511 Age-related nuclear cataract, right eye: Secondary | ICD-10-CM | POA: Diagnosis not present

## 2021-01-15 DIAGNOSIS — H25012 Cortical age-related cataract, left eye: Secondary | ICD-10-CM | POA: Diagnosis not present

## 2021-01-15 DIAGNOSIS — H25811 Combined forms of age-related cataract, right eye: Secondary | ICD-10-CM | POA: Diagnosis not present

## 2021-01-15 DIAGNOSIS — H2512 Age-related nuclear cataract, left eye: Secondary | ICD-10-CM | POA: Diagnosis not present

## 2021-01-15 DIAGNOSIS — H25042 Posterior subcapsular polar age-related cataract, left eye: Secondary | ICD-10-CM | POA: Diagnosis not present

## 2021-01-16 ENCOUNTER — Telehealth: Payer: Self-pay | Admitting: Cardiology

## 2021-01-16 NOTE — Telephone Encounter (Signed)
Pt c/o medication issue:  1. Name of Medication: Evolocumab (REPATHA SURECLICK) 403 MG/ML SOAJ  2. How are you currently taking this medication (dosage and times per day)? As directed   3. Are you having a reaction (difficulty breathing--STAT)? no  4. What is your medication issue? Patient said her medication refills with the [facility] are out. She would like Erasmo Downer to help her get more refills

## 2021-01-17 NOTE — Telephone Encounter (Signed)
Called and spoke w/pt and got them waitlisted for [facility] and pt voiced understanding

## 2021-01-21 ENCOUNTER — Telehealth: Payer: Self-pay | Admitting: Cardiology

## 2021-01-21 MED ORDER — CARVEDILOL 6.25 MG PO TABS: ORAL_TABLET | ORAL | 3 refills | Status: DC

## 2021-01-21 NOTE — Telephone Encounter (Signed)
*  STAT* If patient is at the pharmacy, call can be transferred to refill team.   1. Which medications need to be refilled? (please list name of each medication and dose if known) carvedilol (COREG) 6.25 MG tablet  2. Which pharmacy/location (including street and city if local pharmacy) is medication to be sent to? [facility] [place], [place]  3. Do they need a 30 day or 90 day supply? 90 day supply  Pt is out of this medication

## 2021-01-22 DIAGNOSIS — H25812 Combined forms of age-related cataract, left eye: Secondary | ICD-10-CM | POA: Diagnosis not present

## 2021-01-22 DIAGNOSIS — H2512 Age-related nuclear cataract, left eye: Secondary | ICD-10-CM | POA: Diagnosis not present

## 2021-02-04 NOTE — Progress Notes (Signed)
Veronica Barron Date of Birth: Oct 12, 1940 Medical Record #[id number]  History of Present Illness: Veronica Barron is seen today for followup CAD. She has a history of hypertension, hyperlipidemia, and hypothyroidism. In Sept 2014 she had stenting of the proximal LAD with DES.  The distal left circumflex was occluded with left to left collaterals and right-to-left collaterals. The right coronary was without significant disease. Ejection fraction was 30%.  Her other disease was treated medically. On followup Echo in Dec. 2014 EF had improved to 50-55%. In November 2015 she had a significant episode of chest pain. Repeat cardiac cath showed occlusion of the distal LCx and the stent in the LAD was patent.   She has a history of intolerance to multiple statins, Zetia and Lopid, and fenofibrates. She was previously unable afford a PCSK 9 inhibitor and was seen by Pharm D and it was authorized and she was taking it with help from patient assistance foundation.  She was admitted in October 2020 with acute appendicitis. She had appy without complications.   On follow up today she reports she is feeling very well. No chest pain or SOB. She is still quite active with her walking and gardening. Good energy. Unfortunately assistance from the foundation has run out so she is having to come off the Repatha due to cost.    Current Outpatient Medications on File Prior to Visit  Medication Sig Dispense Refill   ALFALFA PO Take 1 tablet by mouth daily.     aspirin EC 81 MG tablet Take 81 mg by mouth daily.     BESIVANCE 0.6 % SUSP Apply to eye.     carvedilol (COREG) 6.25 MG tablet TAKE ONE TABLET BY MOUTH TWICE DAILY WITH MEALS 180 tablet 3   DUREZOL 0.05 % EMUL Place 1 drop into the left eye 3 (three) times daily.     FLAREX 0.1 % ophthalmic suspension Apply to eye.     levothyroxine (SYNTHROID) 88 MCG tablet TAKE 1 TABLET BY MOUTH DAILY. BEFORE BREAKFAST 90 tablet 1   lisinopril (ZESTRIL) 5 MG tablet Take 1  tablet (5 mg total) by mouth 2 (two) times daily. 60 tablet 11   multivitamin-lutein (OCUVITE-LUTEIN) CAPS capsule Take 1 capsule by mouth daily.     PROLENSA 0.07 % SOLN Place 1 drop into the left eye 2 (two) times daily.     sertraline (ZOLOFT) 50 MG tablet Take 1 tablet (50 mg total) by mouth daily. 90 tablet 0   vitamin B-12 (CYANOCOBALAMIN) 500 MCG tablet Take 500 mcg by mouth daily.     vitamin C (ASCORBIC ACID) 500 MG tablet Take 500 mg by mouth daily.     No current facility-administered medications on file prior to visit.    Allergies  Allergen Reactions   Diphenhydramine Hcl     REACTION: TACHYCARDIA   Fenofibrate Other (See Comments)    constipation   Statins     Myalgia    Zetia [Ezetimibe] Other (See Comments)    Myalgias     Past Medical History:  Diagnosis Date   Allergic rhinitis, cause unspecified    CAD (coronary artery disease)    a. 03/2013 Abnl MV, EF 29%;  b. 03/2013 Cath/PCI: >M nl, LAD 99p (2.5x28 Promus Prem DES), LCX 100 after large OM1, RCA dominant, 20p, EF 30%.   Diverticulosis of colon    Hyperlipidemia    Hypertension    Ischemic cardiomyopathy    a. 03/2013 EF 30% by LV gram.;  b.  Echo (12/14): Mod LVH with severe basal septal hypertrophy w/o LVOT obstr, EF 50-55%, normal wall motion, Gr 1 DD, MAC, mild MR, mild LAE, trivial eff.   Leiomyoma of uterus, unspecified    Unspecified hypothyroidism     Past Surgical History:  Procedure Laterality Date   ABDOMINAL HYSTERECTOMY     fibroid   at  age 57   Boyce N/A 04/26/2019   Procedure: APPENDECTOMY LAPAROSCOPIC;  Surgeon: Jules Husbands, MD;  Location: [facility] ORS;  Service: General;  Laterality: N/A;   LEFT HEART CATHETERIZATION WITH CORONARY ANGIOGRAM N/A 03/16/2013   Procedure: LEFT HEART CATHETERIZATION WITH CORONARY ANGIOGRAM;  Surgeon: Abbie Berling M [name], MD;  Location[facility] C[facility] LAB;  Service: Cardiovascular;  Laterality: N/A;   LEFT HEART CATHETERIZATION WITH  CORONARY ANGIOGRAM N/A 05/23/2014   Procedure: LEFT HEART CATHETERIZATION WITH CORONARY ANGIOGRAM;  Surgeon: Rony Ratz M [name], MD;  Location: [facility] CATH LAB;  Service: Cardiovascular;  Laterality: N/A;   PERCUTANEOUS CORONARY STENT INTERVENTION (PCI-S)  03/16/2013   Procedure: PERCUTANEOUS CORONARY STENT INTERVENTION (PCI-S);  Surgeon: Kalyn Dimattia M [name], MD;  Location: [facility] CATH LAB;  Service: Cardiovascular;;   TUBAL LIGATION      Social History   Tobacco Use  Smoking Status Never  Smokeless Tobacco Never    Social History   Substance and Sexual Activity  Alcohol Use Yes   Comment: wine    Family History  Problem Relation Age of Onset   Dementia Mother    Hyperlipidemia Mother    Hypertension Mother    Coronary artery disease Father    Hypertension Father    Hyperlipidemia Father    Uterine cancer Sister 56   Breast cancer Maternal Aunt     Review of Systems: As noted in history of present illness.  All other systems were reviewed and are negative.  Physical Exam: BP 138/72 (BP Location: Left Arm, Patient Position: Sitting, Cuff Size: Normal)   Pulse 66   Ht _0  (1.6 m)   Wt 150 lb 8 oz (68.3 kg)   SpO2 98%   BMI 26.66 kg/m  GENERAL:  Well appearing WF in NAD HEENT:  PERRL, EOMI, sclera are clear. Oropharynx is clear. NECK:  No jugular venous distention, carotid upstroke brisk and symmetric, no bruits, no thyromegaly or adenopathy LUNGS:  Clear to auscultation bilaterally CHEST:  Unremarkable HEART:  RRR,  PMI not displaced or sustained,S1 and S2 within normal limits, no S3, no S4: no clicks, no rubs, no murmurs ABD:  Soft, nontender. BS +, no masses or bruits. No hepatomegaly, no splenomegaly EXT:  2 + pulses throughout, no edema, no cyanosis no clubbing SKIN:  Warm and dry.  No rashes NEURO:  Alert and oriented x 3. Cranial nerves II through XII intact. 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 Rate 66. Twave inversion laterally. No change.   I have personally reviewed and interpreted this study.  Assessment / Plan: 1. Coronary disease status post stenting of the proximal LAD in September 2014 with drug-eluting stent. 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 Cardiac cath in November 2015 showed continued stent patency. She is asymptomatic.  Continue current therapy. On ASA, Coreg and ACEi.   2. Hyperlipidemia. History of intolerance to multiple statins, Zetia, fenofibrates, and Lopid. Excellent response to Repatha with marked reduction in LDL to 37. Tolerating well. Unable to afford it now. Will keep in mind if inclisiran becomes available.   3. Hypertension- good control. Continue Rx.  4. Hypothyroidism. TSH normal.   Follow up in 6 months

## 2021-02-05 ENCOUNTER — Ambulatory Visit: Payer: Medicare Other | Admitting: Cardiology

## 2021-02-05 ENCOUNTER — Other Ambulatory Visit: Payer: Self-pay

## 2021-02-05 ENCOUNTER — Encounter: Payer: Self-pay | Admitting: Cardiology

## 2021-02-05 VITALS — BP 138/72 | HR 66 | Ht 63.0 in | Wt 150.5 lb

## 2021-02-05 DIAGNOSIS — I10 Essential (primary) hypertension: Secondary | ICD-10-CM

## 2021-02-05 DIAGNOSIS — E7800 Pure hypercholesterolemia, unspecified: Secondary | ICD-10-CM | POA: Diagnosis not present

## 2021-02-05 DIAGNOSIS — I2510 Atherosclerotic heart disease of native coronary artery without angina pectoris: Secondary | ICD-10-CM

## 2021-02-15 ENCOUNTER — Other Ambulatory Visit: Payer: Self-pay | Admitting: Cardiology

## 2021-02-18 ENCOUNTER — Other Ambulatory Visit: Payer: Self-pay | Admitting: Internal Medicine

## 2021-03-01 DIAGNOSIS — Z9849 Cataract extraction status, unspecified eye: Secondary | ICD-10-CM | POA: Diagnosis not present

## 2021-03-01 DIAGNOSIS — H52223 Regular astigmatism, bilateral: Secondary | ICD-10-CM | POA: Diagnosis not present

## 2021-03-01 DIAGNOSIS — H524 Presbyopia: Secondary | ICD-10-CM | POA: Diagnosis not present

## 2021-04-10 ENCOUNTER — Other Ambulatory Visit: Payer: Self-pay | Admitting: Internal Medicine

## 2021-04-10 DIAGNOSIS — Z1231 Encounter for screening mammogram for malignant neoplasm of breast: Secondary | ICD-10-CM

## 2021-04-11 ENCOUNTER — Other Ambulatory Visit: Payer: Self-pay

## 2021-04-11 ENCOUNTER — Ambulatory Visit
Admission: RE | Admit: 2021-04-11 | Discharge: 2021-04-11 | Disposition: A | Discharge status: Home / Self Care | Payer: Medicare Other | Source: Ambulatory Visit | Attending: Internal Medicine | Admitting: Internal Medicine

## 2021-04-11 DIAGNOSIS — Z1231 Encounter for screening mammogram for malignant neoplasm of breast: Secondary | ICD-10-CM

## 2021-04-23 ENCOUNTER — Encounter: Payer: Self-pay | Admitting: Internal Medicine

## 2021-04-23 ENCOUNTER — Ambulatory Visit: Payer: Medicare Other | Admitting: Internal Medicine

## 2021-04-23 ENCOUNTER — Other Ambulatory Visit: Payer: Self-pay

## 2021-04-23 VITALS — BP 126/68 | HR 69 | Temp 96.9°F | Ht 63.0 in | Wt 148.6 lb

## 2021-04-23 DIAGNOSIS — M791 Myalgia, unspecified site: Secondary | ICD-10-CM

## 2021-04-23 DIAGNOSIS — I700 Atherosclerosis of aorta: Secondary | ICD-10-CM

## 2021-04-23 DIAGNOSIS — Z23 Encounter for immunization: Secondary | ICD-10-CM

## 2021-04-23 DIAGNOSIS — E1169 Type 2 diabetes mellitus with other specified complication: Secondary | ICD-10-CM | POA: Diagnosis not present

## 2021-04-23 DIAGNOSIS — R7303 Prediabetes: Secondary | ICD-10-CM

## 2021-04-23 DIAGNOSIS — E785 Hyperlipidemia, unspecified: Secondary | ICD-10-CM | POA: Diagnosis not present

## 2021-04-23 DIAGNOSIS — I25118 Atherosclerotic heart disease of native coronary artery with other forms of angina pectoris: Secondary | ICD-10-CM | POA: Diagnosis not present

## 2021-04-23 DIAGNOSIS — T466X5A Adverse effect of antihyperlipidemic and antiarteriosclerotic drugs, initial encounter: Secondary | ICD-10-CM

## 2021-04-23 NOTE — Progress Notes (Signed)
Subjective:  Patient ID: Veronica Barron, female    DOB: [birth year]  Age: 80 y.o. MRN: [id number]  CC: The encounter diagnosis was Need for immunization against influenza.  HPI SYMPHANIE CEDERBERG presents for follow up on tprediabetes/type 2 DM and hyperlipidemia  Chief Complaint  Patient presents with   Follow-up    Follow up on Diabetes   1) aortic atherosclerosis : noted on Oct 2020 CT.    Was  taking repatha until 3 months ago when the prescription for medication that reduced the cost ran out from her cardiologist  . She has had trials of zetia and statins , a ll of which resulted in myalgias.   2) prediabetes:  discussed potential diagnosis of type 2 DM:  based on  June  a1c of 6.6 .  She has never had a fasting glucose over 111,  no family history , and Is concerned that the a1c has been driven up by the cholesterol lowering medications.   3) HTN:  Patient is taking her medications as prescribed and notes no adverse effects.  Home BP readings have been done about once per week and are  generally < 130/80 .  She is avoiding added salt in her diet and walking regularly about 3 times per week for exercise  .    Outpatient Medications Prior to Visit  Medication Sig Dispense Refill   ALFALFA PO Take 1 tablet by mouth daily.     aspirin EC 81 MG tablet Take 81 mg by mouth daily.     carvedilol (COREG) 6.25 MG tablet TAKE ONE TABLET BY MOUTH TWICE DAILY WITH MEALS 180 tablet 3   levothyroxine (SYNTHROID) 88 MCG tablet TAKE 1 TABLET BY MOUTH DAILY. BEFORE BREAKFAST 90 tablet 1   lisinopril (ZESTRIL) 5 MG tablet Take 1 tablet (5 mg total) by mouth 2 (two) times daily. 60 tablet 11   loratadine (CLARITIN) 10 MG tablet Take 10 mg by mouth daily.     multivitamin-lutein (OCUVITE-LUTEIN) CAPS capsule Take 1 capsule by mouth daily.     sertraline (ZOLOFT) 50 MG tablet TAKE 1 TABLET BY MOUTH DAILY. GENERIC EQUIVALENT FOR ZOLOFT 90 tablet 0   vitamin B-12 (CYANOCOBALAMIN) 500 MCG tablet Take 500 mcg  by mouth daily.     vitamin C (ASCORBIC ACID) 500 MG tablet Take 500 mg by mouth daily.     BESIVANCE 0.6 % SUSP Apply to eye. (Patient not taking: Reported on 04/23/2021)     DUREZOL 0.05 % EMUL Place 1 drop into the left eye 3 (three) times daily. (Patient not taking: Reported on 04/23/2021)     FLAREX 0.1 % ophthalmic suspension Apply to eye. (Patient not taking: Reported on 04/23/2021)     PROLENSA 0.07 % SOLN Place 1 drop into the left eye 2 (two) times daily. (Patient not taking: Reported on 04/23/2021)     No facility-administered medications prior to visit.    Review of Systems;  Patient denies headache, fevers, malaise, unintentional weight loss, skin rash, eye pain, sinus congestion and sinus pain, sore throat, dysphagia,  hemoptysis , cough, dyspnea, wheezing, chest pain, palpitations, orthopnea, edema, abdominal pain, nausea, melena, diarrhea, constipation, flank pain, dysuria, hematuria, urinary  Frequency, nocturia, numbness, tingling, seizures,  Focal weakness, Loss of consciousness,  Tremor, insomnia, depression, anxiety, and suicidal ideation.      Objective:  BP 126/68 (BP Location: Left Arm, Patient Position: Sitting, Cuff Size: Normal)   Pulse 69   Temp (!) 96.9 F (36.1 C) (  Temporal)   Ht 5\' 3"  (1.6 m)   Wt 148 lb 9.6 oz (67.4 kg)   SpO2 96%   BMI 26.32 kg/m   BP Readings from Last 3 Encounters:  04/23/21 126/68  02/05/21 138/72  07/27/20 136/88    Wt Readings from Last 3 Encounters:  04/23/21 148 lb 9.6 oz (67.4 kg)  02/05/21 150 lb 8 oz (68.3 kg)  07/27/20 153 lb (69.4 kg)    General appearance: alert, cooperative and appears stated age Ears: normal TM's and external ear canals both ears Throat: lips, mucosa, and tongue normal; teeth and gums normal Neck: no adenopathy, no carotid bruit, supple, symmetrical, trachea midline and thyroid not enlarged, symmetric, no tenderness/mass/nodules Back: symmetric, no curvature. ROM normal. No CVA  tenderness. Lungs: clear to auscultation bilaterally Heart: regular rate and rhythm, S1, S2 normal, no murmur, click, rub or gallop Abdomen: soft, non-tender; bowel sounds normal; no masses,  no organomegaly Pulses: 2+ and symmetric Skin: Skin color, texture, turgor normal. No rashes or lesions Lymph nodes: Cervical, supraclavicular, and axillary nodes normal. Foot exam:  Nails are well trimmed,  No callouses,  Sensation intact to microfilament   Lab Results  Component Value Date   HGBA1C 6.6 (H) 12/25/2020   HGBA1C 6.4 06/22/2020   HGBA1C 6.1 06/17/2019    Lab Results  Component Value Date   CREATININE 0.77 12/25/2020   CREATININE 0.76 06/22/2020   CREATININE 0.77 06/17/2019    Lab Results  Component Value Date   WBC 6.6 06/22/2020   HGB 12.7 06/22/2020   HCT 39.5 06/22/2020   PLT 312.0 06/22/2020   GLUCOSE 111 (H) 12/25/2020   CHOL 110 12/25/2020   TRIG 205.0 (H) 12/25/2020   HDL 53.40 12/25/2020   LDLDIRECT 34.0 12/25/2020   LDLCALC 37 06/17/2019   ALT 15 12/25/2020   AST 19 12/25/2020   NA 139 12/25/2020   K 4.4 12/25/2020   CL 102 12/25/2020   CREATININE 0.77 12/25/2020   BUN 14 12/25/2020   CO2 27 12/25/2020   TSH 0.75 12/25/2020   INR 0.92 05/17/2014   HGBA1C 6.6 (H) 12/25/2020   MICROALBUR <0.7 06/17/2019    MM 3D SCREEN BREAST BILATERAL  Result Date: 04/17/2021 CLINICAL DATA:  Screening. EXAM: DIGITAL SCREENING BILATERAL MAMMOGRAM WITH TOMOSYNTHESIS AND CAD TECHNIQUE: Bilateral screening digital craniocaudal and mediolateral oblique mammograms were obtained. Bilateral screening digital breast tomosynthesis was performed. The images were evaluated with computer-aided detection. COMPARISON:  Previous exam(s). ACR Breast Density Category b: There are scattered areas of fibroglandular density. FINDINGS: There are no findings suspicious for malignancy. IMPRESSION: No mammographic evidence of malignancy. A result letter of this screening mammogram will be mailed  directly to the patient. RECOMMENDATION: Screening mammogram in one year. (Code:SM-B-01Y) BI-RADS CATEGORY  1: Negative. Electronically Signed   By: Ammie Ferrier M.D.   On: 04/17/2021 07:58    Assessment & Plan:   Problem List Items Addressed This Visit   None Visit Diagnoses     Need for immunization against influenza    -  Primary   Relevant Orders   Flu Vaccine QUAD High Dose(Fluad) (Completed)       I spent 40 minutes dedicated to the care of this patient on the date of this encounter to include pre-visit review of her medical history,  most recent imaging studies, Face-to-face time with the patient , and post visit ordering of testing and therapeutics.   Medications Discontinued During This Encounter  Medication Reason   BESIVANCE 0.6 %  SUSP Completed Course   DUREZOL 0.05 % EMUL Completed Course   FLAREX 0.1 % ophthalmic suspension Completed Course   PROLENSA 0.07 % SOLN Completed Course    Follow-up: No follow-ups on file.   [name], MD

## 2021-04-23 NOTE — Patient Instructions (Addendum)

## 2021-04-23 NOTE — Assessment & Plan Note (Signed)
Managed with Repatha due to statin myalgias.  LDL is well below 70   Lab Results  Component Value Date   CHOL 110 12/25/2020   HDL 53.40 12/25/2020   LDLCALC 37 06/17/2019   LDLDIRECT 34.0 12/25/2020   TRIG 205.0 (H) 12/25/2020   CHOLHDL 2 12/25/2020

## 2021-04-24 ENCOUNTER — Encounter: Payer: Self-pay | Admitting: Internal Medicine

## 2021-04-24 ENCOUNTER — Telehealth: Payer: Self-pay

## 2021-04-24 DIAGNOSIS — I700 Atherosclerosis of aorta: Secondary | ICD-10-CM | POA: Insufficient documentation

## 2021-04-24 LAB — COMPREHENSIVE METABOLIC PANEL
ALT: 15 U/L (ref 0–35)
AST: 21 U/L (ref 0–37)
Albumin: 4.2 g/dL (ref 3.5–5.2)
Alkaline Phosphatase: 55 U/L (ref 39–117)
BUN: 16 mg/dL (ref 6–23)
CO2: 26 mEq/L (ref 19–32)
Calcium: 9.5 mg/dL (ref 8.4–10.5)
Chloride: 103 mEq/L (ref 96–112)
Creatinine, Ser: 0.82 mg/dL (ref 0.40–1.20)
GFR: 67.35 mL/min (ref >60.00)
Glucose, Bld: 89 mg/dL (ref 70–99)
Potassium: 4.1 mEq/L (ref 3.5–5.1)
Sodium: 138 mEq/L (ref 135–145)
Total Bilirubin: 0.4 mg/dL (ref 0.2–1.2)
Total Protein: 6.7 g/dL (ref 6.0–8.3)

## 2021-04-24 LAB — MICROALBUMIN / CREATININE URINE RATIO
Creatinine,U: 51 mg/dL
Microalb Creat Ratio: 1.4 mg/g (ref 0.0–30.0)
Microalb, Ur: <0.7 mg/dL (ref 0.0–1.9)

## 2021-04-24 LAB — HEMOGLOBIN A1C: Hgb A1c MFr Bld: 6.4 % (ref 4.6–6.5)

## 2021-04-24 LAB — LIPID PANEL W/REFLEX DIRECT LDL
Cholesterol: 232 mg/dL — ABNORMAL HIGH (ref <200)
HDL: 53 mg/dL (ref >=50)
LDL Cholesterol (Calc): 136 mg/dL (calc) — ABNORMAL HIGH
Non-HDL Cholesterol (Calc): 179 mg/dL (calc) — ABNORMAL HIGH (ref <130)
Total CHOL/HDL Ratio: 4.4 (calc) (ref <5.0)
Triglycerides: 282 mg/dL — ABNORMAL HIGH (ref <150)

## 2021-04-24 NOTE — Chronic Care Management (AMB) (Signed)
  Chronic Care Management   Note  04/24/2021 Name: [name] MRN: [id number] DOB: 11-25-1940  Veronica Barron is a 80 y.o. year old female who is a primary care patient of Derrel Nip, [name], MD. I reached out to [name] by phone today in response to a referral sent by [name] PCP.  [name] was given information about Chronic Care Management services today including:  CCM service includes personalized support from designated clinical staff supervised by her physician, including individualized plan of care and coordination with other care providers 24/7 contact phone numbers for assistance for urgent and routine care needs. Service will only be billed when office clinical staff spend 20 minutes or more in a month to coordinate care. Only one practitioner may furnish and bill the service in a calendar month. The patient may stop CCM services at any time (effective at the end of the month) by phone call to the office staff. The patient is responsible for co-pay (up to 20% after annual deductible is met) if co-pay is required by the individual health plan.   Patient agreed to services and verbal consent obtained.   Follow up plan: Telephone appointment with care management team member scheduled for:05/02/2021  Noreene Larsson, [address], [place], [place] [address] Direct Dial: [phone] [email or web address]

## 2021-04-24 NOTE — Assessment & Plan Note (Signed)
  Aortic atherosclerosis:  Reviewed findings of prior CT scan today..  Patient  Is intolerant of statins  due to myalgias . Resume Repatha when available

## 2021-04-24 NOTE — Assessment & Plan Note (Signed)
She remains very skeptical of the diagnosis of type 2 DM . Repeat a1c is 6.4 and fasting glucoses have been nondiagnostic. Given her age,  The use of anti hyperglycemics was decided against unless her a1c rises to 7.0  .  Continue  /resume PCSK9 inhibitor and ACE inhibitor.   Lab Results  Component Value Date   HGBA1C 6.4 04/23/2021   Lab Results  Component Value Date   MICROALBUR <0.7 04/23/2021   MICROALBUR <0.7 06/17/2019

## 2021-05-02 ENCOUNTER — Telehealth: Payer: Medicare Other

## 2021-05-07 ENCOUNTER — Other Ambulatory Visit: Payer: Self-pay

## 2021-05-07 ENCOUNTER — Ambulatory Visit (INDEPENDENT_AMBULATORY_CARE_PROVIDER_SITE_OTHER): Payer: Medicare Other | Admitting: Pharmacist

## 2021-05-07 DIAGNOSIS — I25118 Atherosclerotic heart disease of native coronary artery with other forms of angina pectoris: Secondary | ICD-10-CM

## 2021-05-07 DIAGNOSIS — T466X5A Adverse effect of antihyperlipidemic and antiarteriosclerotic drugs, initial encounter: Secondary | ICD-10-CM

## 2021-05-07 DIAGNOSIS — I700 Atherosclerosis of aorta: Secondary | ICD-10-CM

## 2021-05-07 DIAGNOSIS — I255 Ischemic cardiomyopathy: Secondary | ICD-10-CM

## 2021-05-07 MED ORDER — REPATHA SURECLICK 140 MG/ML ~~LOC~~ SOAJ
140.0000 mg | SUBCUTANEOUS | 0 refills | Status: DC
  Filled 2021-05-07: qty 2, 28d supply, fill #0

## 2021-05-07 NOTE — Chronic Care Management (AMB) (Signed)
Chronic Care Management Pharmacy Note  05/07/2021 Name:  Veronica Barron MRN:  [id number] DOB:  [birth year]  Subjective: Veronica Barron is an 80 y.o. year old female who is a primary patient of [name], [name], MD.  The CCM team was consulted for assistance with disease management and care coordination needs.    Engaged with patient by telephone for initial visit for pharmacy case management and/or care coordination services.   Objective:  Medications Reviewed Today     Reviewed by De Hollingshead, RPH-CPP (Pharmacist) on 05/07/21 at 77  Med List Status: <None>   Medication Order Taking? Sig Documenting Provider Last Dose Status Informant  ALFALFA PO [id number] Yes Take 1 tablet by mouth daily. [provider] Taking Active Self  aspirin EC 81 MG tablet [id number] Yes Take 81 mg by mouth daily. [provider] Taking Active Self  carvedilol (COREG) 6.25 MG tablet [id number] Yes TAKE ONE TABLET BY MOUTH TWICE DAILY WITH MEALS [name], [name], MD Taking Active   levothyroxine (SYNTHROID) 88 MCG tablet [id number] Yes TAKE 1 TABLET BY MOUTH DAILY. BEFORE BREAKFAST [name], MD Taking Active   lisinopril (ZESTRIL) 5 MG tablet [id number] Yes Take 1 tablet (5 mg total) by mouth 2 (two) times daily. [name], [name], MD Taking Active   loratadine (CLARITIN) 10 MG tablet [id number] Yes Take 10 mg by mouth daily. [provider] Taking Active   multivitamin-lutein [facility]) CAPS capsule [id number] Yes Take 1 capsule by mouth daily. [provider] Taking Active Self  sertraline (ZOLOFT) 50 MG tablet [id number] Yes TAKE 1 TABLET BY MOUTH DAILY. GENERIC EQUIVALENT FOR ZOLOFT [name], MD Taking Active   vitamin B-12 (CYANOCOBALAMIN) 500 MCG tablet [id number] Yes Take 500 mcg by mouth daily. [provider] Taking Active Self  vitamin C (ASCORBIC ACID) 500 MG tablet [id number] Yes Take 500 mg by mouth daily. [provider] Taking  Active Self            Lab Results  Component Value Date   CHOL 232 (H) 04/23/2021   HDL 53 04/23/2021   LDLCALC 136 (H) 04/23/2021   LDLDIRECT 34.0 12/25/2020   TRIG 282 (H) 04/23/2021   CHOLHDL 4.4 04/23/2021   Lab Results  Component Value Date   CREATININE 0.82 04/23/2021   BUN 16 04/23/2021   NA 138 04/23/2021   K 4.1 04/23/2021   CL 103 04/23/2021   CO2 26 04/23/2021     Assessment/Interventions: Review of patient past medical history, allergies, medications, health status, including review of consultants reports, laboratory and other test data, was performed as part of comprehensive evaluation and provision of chronic care management services.   SDOH:  (Social Determinants of Health) assessments and interventions performed: Yes SDOH Interventions    Flowsheet Row Most Recent Value  SDOH Interventions   Financial Strain Interventions Other (Comment)  [manufacturer assistance]        CCM Care Plan  Review of patient past medical history, allergies, medications, health status, including review of consultants reports, laboratory and other test data, was performed as part of comprehensive evaluation and provision of chronic care management services.   Conditions to be addressed/monitored:  Hyperlipidemia and Coronary Artery Disease  Care Plan : Medication Management  Updates made by De Hollingshead, RPH-CPP since 05/07/2021 12:00 AM     Problem: CAD, Hyperlipidemia, Pre-diabetes      Long-Range Goal: Medication Monitoring   Start Date: 05/07/2021  This Visit's Progress: On track  Priority: High  Note:   Current Barriers:  Unable to independently afford treatment regimen  Pharmacist Clinical Goal(s):  patient will verbalize ability to afford treatment regimen achieve control of cholesterol through collaboration with PharmD and provider.    Interventions: 1:1 collaboration with [name], MD regarding development and update of  comprehensive plan of care as evidenced by provider attestation and co-signature Inter-disciplinary care team collaboration (see longitudinal plan of care) Comprehensive medication review performed; medication list updated in electronic medical record  Heart Failure, improved:  Appropriately managed; current treatment:  ARNI/ACEi/ARB: lisinopril 5 mg BID Beta blocker: carvedilol 6.25 mg BID SGLT2: none Mineralocorticoid Receptor Antagonist: none Diuretic: none Recommended to continue current regimen at this time  Hyperlipidemia and secondary ASCVD prevention   Uncontrolled due to access; current treatment:none; previously on Repatha 140 mg Q14 days with improvement in cholesterol and tolerability, but grant foundation funding ran out and she had to stop therapy.  Antiplatelet agent: aspirin 81 mg daily  Medications previously tried: Intolerant: zetia - myalgias; Fenofibrate - constipation; gemfibrozil - GI upset; atorvastatin (2000-2001) - myalgias; pravastatin 40 (03/2013) - myalgias; rosuvastatin 5 mg (Sept-Oct 2014) - myalgias; simvastatin 5 mg (Oct-Nov 2014) - myalgias  [employer] and [employer] closed at this time. Can attempt Repatha patient assistance from manufacturer until foundations reopen. Patient amenable. Will require proof of income and proof of copay. Will send prescription to [facility] pharmacy to provide proof of copay for medication. Will collaborate w/ CPhT to mail patient portion of application, patient aware proof of copay required. Will collaborate w/ Dr. Derrel Nip for provider portion.   Hypothyroidism: Controlled per last lab work; current regimen: levothyroxine 88 mcg daily Recommended to continue current regimen at this time  Depression: Controlled per last visit; current regimen: sertraline 50 mg daily Recommended to continue current regimen at this time  Supplements: Vitamin B12, alfalfa, lutein, vitamin C  Patient Goals/Self-Care Activities patient  will:  - take medications as prescribed collaborate with provider on medication access solutions  Follow Up Plan: Telephone follow up appointment with care management team member scheduled for: pending medication access       Plan: Telephone follow up appointment with care management team member scheduled for:  pending medication access  [name], PharmD, [place], CPP Clinical Pharmacist [employer] [facility] [place]

## 2021-05-07 NOTE — Patient Instructions (Signed)
Veronica Barron,   It was great talking to you today!  My pharmacy technician, Sharee Pimple, will mail you the pages of the application for [employer] assistance that we need you to complete. I'll work with Dr. Derrel Nip to get the provider portion. We will also need a copy of your 2021 tax return and a copy of your prescription insurance card.   Take care!  [name], PharmD   Visit Information   PATIENT GOALS:   Goals Addressed               This Visit's Progress     Patient Stated     Medication Management (pt-stated)        Patient Goals/Self-Care Activities patient will:  - take medications as prescribed collaborate with provider on medication access solutions        Consent to CCM Services: Ms. Switalski was given information about Chronic Care Management services including:  CCM service includes personalized support from designated clinical staff supervised by her physician, including individualized plan of care and coordination with other care providers 24/7 contact phone numbers for assistance for urgent and routine care needs. Service will only be billed when office clinical staff spend 20 minutes or more in a month to coordinate care. Only one practitioner may furnish and bill the service in a calendar month. The patient may stop CCM services at any time (effective at the end of the month) by phone call to the office staff. The patient will be responsible for cost sharing (co-pay) of up to 20% of the service fee (after annual deductible is met).  Patient agreed to services and verbal consent obtained.   The patient verbalized understanding of instructions, educational materials, and care plan provided today and declined offer to receive copy of patient instructions, educational materials, and care plan.  Plan: Telephone follow up appointment with care management team member scheduled for:  pending medication access   [name], PharmD, [place], CPP Clinical Pharmacist [place] [facility] [place]: Patient Care Plan: Medication Management     Problem Identified: CAD, Hyperlipidemia, Pre-diabetes      Long-Range Goal: Medication Monitoring   Start Date: 05/07/2021  This Visit's Progress: On track  Priority: High  Note:   Current Barriers:  Unable to independently afford treatment regimen  Pharmacist Clinical Goal(s):  patient will verbalize ability to afford treatment regimen achieve control of cholesterol through collaboration with PharmD and provider.    Interventions: 1:1 collaboration with [name], MD regarding development and update of comprehensive plan of care as evidenced by provider attestation and co-signature Inter-disciplinary care team collaboration (see longitudinal plan of care) Comprehensive medication review performed; medication list updated in electronic medical record  Heart Failure, improved:  Appropriately managed; current treatment:  ARNI/ACEi/ARB: lisinopril 5 mg BID Beta blocker: carvedilol 6.25 mg BID SGLT2: none Mineralocorticoid Receptor Antagonist: none Diuretic: none Recommended to continue current regimen at this time  Hyperlipidemia and secondary ASCVD prevention   Uncontrolled due to access; current treatment:none; previously on Repatha 140 mg Q14 days with improvement in cholesterol and tolerability, but grant foundation funding ran out and she had to stop therapy.  Antiplatelet agent: aspirin 81 mg daily  Medications previously tried: Intolerant: zetia - myalgias; Fenofibrate - constipation; gemfibrozil - GI upset; atorvastatin (2000-2001) - myalgias; pravastatin 40 (03/2013) - myalgias; rosuvastatin 5 mg (Sept-Oct 2014) - myalgias; simvastatin 5 mg (Oct-Nov 2014) - myalgias  [employer] and [employer] closed at this time. Can attempt  Repatha patient assistance from manufacturer until foundations reopen. Patient amenable. Will require proof of income and proof  of copay. Will send prescription to [facility] pharmacy to provide proof of copay for medication. Will collaborate w/ CPhT to mail patient portion of application, patient aware proof of copay required. Will collaborate w/ Dr. Derrel Nip for provider portion.   Hypothyroidism: Controlled per last lab work; current regimen: levothyroxine 88 mcg daily Recommended to continue current regimen at this time  Depression: Controlled per last visit; current regimen: sertraline 50 mg daily Recommended to continue current regimen at this time  Supplements: Vitamin B12, alfalfa, lutein, vitamin C  Patient Goals/Self-Care Activities patient will:  - take medications as prescribed collaborate with provider on medication access solutions  Follow Up Plan: Telephone follow up appointment with care management team member scheduled for: pending medication access

## 2021-05-13 DIAGNOSIS — I25118 Atherosclerotic heart disease of native coronary artery with other forms of angina pectoris: Secondary | ICD-10-CM | POA: Diagnosis not present

## 2021-05-13 DIAGNOSIS — I255 Ischemic cardiomyopathy: Secondary | ICD-10-CM | POA: Diagnosis not present

## 2021-05-14 ENCOUNTER — Telehealth: Payer: Self-pay | Admitting: Pharmacy Technician

## 2021-05-14 DIAGNOSIS — Z596 Low income: Secondary | ICD-10-CM

## 2021-05-14 NOTE — Progress Notes (Signed)
[facility])                                            [place] Team    05/14/2021  JANNETT SCHMALL 1940/08/04 [id number]                                      Medication Assistance Referral  Referral From: [place] RPh Catie T.   Medication/Company: Repatha / [employer] Patient application portion:  Mailed Provider application portion: Faxed  to Dr. Derrel Nip Provider address/fax verified via: Office website  Paublo Warshawsky P. Eloisa Chokshi, [place]  [phone]

## 2021-05-17 ENCOUNTER — Other Ambulatory Visit: Payer: Self-pay | Admitting: Cardiology

## 2021-05-17 ENCOUNTER — Telehealth: Payer: Self-pay | Admitting: Internal Medicine

## 2021-05-17 DIAGNOSIS — I700 Atherosclerosis of aorta: Secondary | ICD-10-CM

## 2021-05-17 DIAGNOSIS — T466X5A Adverse effect of antihyperlipidemic and antiarteriosclerotic drugs, initial encounter: Secondary | ICD-10-CM

## 2021-05-17 DIAGNOSIS — I25118 Atherosclerotic heart disease of native coronary artery with other forms of angina pectoris: Secondary | ICD-10-CM

## 2021-05-17 DIAGNOSIS — M791 Myalgia, unspecified site: Secondary | ICD-10-CM

## 2021-05-17 NOTE — Telephone Encounter (Signed)
Pt called in with questions about medication Evolocumab (REPATHA SURECLICK) 900 MG/ML SOAJ. Pt wouldn't state what questions she had about medication. Pt hung up phone. Please call back pt.

## 2021-05-20 MED ORDER — REPATHA SURECLICK 140 MG/ML ~~LOC~~ SOAJ
SUBCUTANEOUS | 11 refills | Status: DC
Start: 2021-05-20 — End: 2022-05-06

## 2021-05-20 NOTE — Telephone Encounter (Signed)
Pt called in to get [employer] grant approval and it was provided via email [email or web address] Also rx sent in for repatha. Pt voiced gratitude and understanding

## 2021-05-20 NOTE — Telephone Encounter (Signed)
Called and lvm that the pt was approved through the [place] to receive repatha free of charge at the pharmacy.will need the pt to call me back asap so I can send her the approval information to take to pharmacy.

## 2021-05-20 NOTE — Addendum Note (Signed)
Addended by: Allean Found on: 05/20/2021 11:00 AM   Modules accepted: Orders

## 2021-05-21 NOTE — Telephone Encounter (Signed)
Pt called with an update in regards to medication Evolocumab. Pt states her cardiologist was able to get her started on the medication so she doesn't need [place] to prescribe it to her.

## 2021-05-23 NOTE — Telephone Encounter (Signed)
FYI

## 2021-05-24 ENCOUNTER — Ambulatory Visit: Payer: Medicare Other | Admitting: Pharmacist

## 2021-05-24 DIAGNOSIS — M791 Myalgia, unspecified site: Secondary | ICD-10-CM

## 2021-05-24 DIAGNOSIS — T466X5A Adverse effect of antihyperlipidemic and antiarteriosclerotic drugs, initial encounter: Secondary | ICD-10-CM

## 2021-05-24 DIAGNOSIS — I25118 Atherosclerotic heart disease of native coronary artery with other forms of angina pectoris: Secondary | ICD-10-CM

## 2021-05-24 DIAGNOSIS — I700 Atherosclerosis of aorta: Secondary | ICD-10-CM

## 2021-05-24 NOTE — Patient Instructions (Addendum)
Visit Information  Patient Goals/Self-Care Activities patient will:  - take medications as prescribed collaborate with provider on medication access solutions  Patient verbalizes understanding of instructions provided today and agrees to view in [place].   Plan: Goals of care met. Closing CCM case.  [name], PharmD, [place], [place] Clinical Pharmacist [employer] at [employer] [phone]

## 2021-05-24 NOTE — Chronic Care Management (AMB) (Signed)
Chronic Care Management CCM Pharmacy Note  05/24/2021 Name:  Veronica Barron MRN:  [id number] DOB:  January 28, 1941  Summary: - [place] reopened for grant funding, patient assisted by cardiology office in completing this  Recommendations/Changes made from today's visit: - Outreached lipid clinic CMA to let them know that is having a difficult time with accessing card information.   Subjective: Veronica Barron is an 80 y.o. year old female who is a primary patient of [name], [name], MD.  The CCM team was consulted for assistance with disease management and care coordination needs.    Engaged with patient by telephone for follow up visit for pharmacy case management and/or care coordination services.   Objective:  Medications Reviewed Today     Reviewed by De Hollingshead, RPH-CPP (Pharmacist) on 05/07/21 at 11  Med List Status: <None>   Medication Order Taking? Sig Documenting Provider Last Dose Status Informant  ALFALFA PO [id number] Yes Take 1 tablet by mouth daily. [provider] Taking Active Self  aspirin EC 81 MG tablet [id number] Yes Take 81 mg by mouth daily. [provider] Taking Active Self  carvedilol (COREG) 6.25 MG tablet [id number] Yes TAKE ONE TABLET BY MOUTH TWICE DAILY WITH MEALS [name], [name], MD Taking Active   levothyroxine (SYNTHROID) 88 MCG tablet [id number] Yes TAKE 1 TABLET BY MOUTH DAILY. BEFORE BREAKFAST [name], MD Taking Active   lisinopril (ZESTRIL) 5 MG tablet [id number] Yes Take 1 tablet (5 mg total) by mouth 2 (two) times daily. [name], [name], MD Taking Active   loratadine (CLARITIN) 10 MG tablet [id number] Yes Take 10 mg by mouth daily. [provider] Taking Active   multivitamin-lutein [facility]) CAPS capsule [id number] Yes Take 1 capsule by mouth daily. [provider] Taking Active Self  sertraline (ZOLOFT) 50 MG tablet [id number] Yes TAKE 1 TABLET BY MOUTH DAILY. GENERIC EQUIVALENT FOR  ZOLOFT [name], MD Taking Active   vitamin B-12 (CYANOCOBALAMIN) 500 MCG tablet [id number] Yes Take 500 mcg by mouth daily. [provider] Taking Active Self  vitamin C (ASCORBIC ACID) 500 MG tablet [id number] Yes Take 500 mg by mouth daily. [provider] Taking Active Self            Pertinent Labs:   Lab Results  Component Value Date   HGBA1C 6.4 04/23/2021   Lab Results  Component Value Date   CHOL 232 (H) 04/23/2021   HDL 53 04/23/2021   LDLCALC 136 (H) 04/23/2021   LDLDIRECT 34.0 12/25/2020   TRIG 282 (H) 04/23/2021   CHOLHDL 4.4 04/23/2021   Lab Results  Component Value Date   CREATININE 0.82 04/23/2021   BUN 16 04/23/2021   NA 138 04/23/2021   K 4.1 04/23/2021   CL 103 04/23/2021   CO2 26 04/23/2021    SDOH:  (Social Determinants of Health) assessments and interventions performed:  SDOH Interventions    Flowsheet Row Most Recent Value  SDOH Interventions   Financial Strain Interventions Other (Comment)  [lipid foundation grant funding]       CCM Care Plan  Review of patient past medical history, allergies, medications, health status, including review of consultants reports, laboratory and other test data, was performed as part of comprehensive evaluation and provision of chronic care management services.   Care Plan : Medication Management  Updates made by De Hollingshead, RPH-CPP since 05/24/2021 12:00 AM     Problem: CAD, Hyperlipidemia, Pre-diabetes      Long-Range Goal:  Medication Monitoring   Start Date: 05/07/2021  Recent Progress: On track  Priority: High  Note:   Current Barriers:  Unable to independently afford treatment regimen  Pharmacist Clinical Goal(s):  patient will verbalize ability to afford treatment regimen achieve control of cholesterol through collaboration with PharmD and provider.    Interventions: 1:1 collaboration with [name], MD regarding development and update of comprehensive  plan of care as evidenced by provider attestation and co-signature Inter-disciplinary care team collaboration (see longitudinal plan of care) Comprehensive medication review performed; medication list updated in electronic medical record  Heart Failure, improved:  Appropriately managed; current treatment:  ARNI/ACEi/ARB: lisinopril 5 mg BID Beta blocker: carvedilol 6.25 mg BID SGLT2: none Mineralocorticoid Receptor Antagonist: none Diuretic: none Previously recommended to continue current regimen at this time  Hyperlipidemia and secondary ASCVD prevention   Uncontrolled due to access; current treatment: none; previously on Repatha 140 mg Q14 days with improvement in cholesterol and tolerability, but grant foundation funding ran out and she had to stop therapy.  Antiplatelet agent: aspirin 81 mg daily  Medications previously tried: Intolerant: zetia - myalgias; Fenofibrate - constipation; gemfibrozil - GI upset; atorvastatin (2000-2001) - myalgias; pravastatin 40 (03/2013) - myalgias; rosuvastatin 5 mg (Sept-Oct 2014) - myalgias; simvastatin 5 mg (Oct-Nov 2014) - myalgias  Assisted in [facility] reapplication through cardiology. Unclear if patient provided copay card information to her pharmacy. Contacted CMA with Lipid Clinic  Hypothyroidism: Controlled per last lab work; current regimen: levothyroxine 88 mcg daily Recommended to continue current regimen at this time  Depression: Controlled per last visit; current regimen: sertraline 50 mg daily Recommended to continue current regimen at this time  Supplements: Vitamin B12, alfalfa, lutein, vitamin C  Patient Goals/Self-Care Activities patient will:  - take medications as prescribed collaborate with provider on medication access solutions       Plan: Goals of care met. Closing CCM case.  Veronica Barron, PharmD, [place], [place]  Clinical Pharmacist [employer] at [employer] [phone]

## 2021-05-27 ENCOUNTER — Telehealth: Payer: Self-pay

## 2021-05-27 NOTE — Telephone Encounter (Signed)
-----   Message from De Hollingshead, RPH-CPP sent at 05/24/2021  4:32 PM EST ----- Veronica Barron,  This patient is still having issues at the pharmacy with the [place] card. I couldn't tell if she was using old card info or the new card, and I can't read the billing info from the screenshot. Could you help her out? Thanks!  Catie

## 2021-05-27 NOTE — Telephone Encounter (Signed)
LMOM THAT I CALLED THE PHARMACY AND THEY ARE WORKING ON IT AS THERE IS NOTHING I CAN DO ON MY END

## 2021-05-29 ENCOUNTER — Other Ambulatory Visit: Payer: Self-pay | Admitting: Internal Medicine

## 2021-05-31 ENCOUNTER — Ambulatory Visit (INDEPENDENT_AMBULATORY_CARE_PROVIDER_SITE_OTHER): Payer: Medicare Other

## 2021-05-31 VITALS — Ht 63.0 in | Wt 148.0 lb

## 2021-05-31 DIAGNOSIS — Z0000 Encounter for general adult medical examination without abnormal findings: Secondary | ICD-10-CM

## 2021-05-31 NOTE — Progress Notes (Addendum)
Subjective:   Veronica Barron is a 80 y.o. female who presents for Medicare Annual (Subsequent) preventive examination.  Review of Systems    No ROS.  Medicare Wellness Virtual Visit.  Visual/audio telehealth visit, UTA vital signs.   See social history for additional risk factors.   Cardiac Risk Factors include: advanced age (>62mn, >>28women)     Objective:    Today's Vitals   05/31/21 1232  Weight: 148 lb (67.1 kg)  Height: _0  (1.6 m)   Body mass index is 26.22 kg/m.  Advanced Directives 05/31/2021 05/30/2020 05/30/2019 04/27/2019 04/26/2019 05/26/2018 05/25/2017  Does Patient Have a Medical Advance Directive? Yes Yes Yes - No Yes Yes  Type of AParamedicof [place] will [place]Living will Living will;Healthcare Power of [place]Living will [place]ut[place] BayLiving will  Does patient want to make changes to medical advance directive? No - Patient declined No - Patient declined No - Patient declined - - No - Patient declined No - Patient declined  Copy of HPenuelasin Chart? Yes - validated most recent copy scanned in chart (See row information) Yes - validated most recent copy scanned in chart (See row information) Yes - validated most recent copy scanned in chart (See row information) - - Yes - validated most recent copy scanned in chart (See row information) Yes  Would patient like information on creating a medical advance directive? - - - No - Patient declined - - -    Current Medications (verified) Outpatient Encounter Medications as of 05/31/2021  Medication Sig   ALFALFA PO Take 1 tablet by mouth daily.   aspirin EC 81 MG tablet Take 81 mg by mouth daily.   carvedilol (COREG) 6.25 MG tablet TAKE ONE TABLET BY MOUTH TWICE DAILY WITH MEALS   Evolocumab (REPATHA SURECLICK) 1229MG/ML SOAJ INJECT ONE SYRINGEFUL INTO THE SKIN EVERY 14 DAYS   levothyroxine  (SYNTHROID) 88 MCG tablet TAKE 1 TABLET BY MOUTH DAILY. BEFORE BREAKFAST   lisinopril (ZESTRIL) 5 MG tablet Take 1 tablet (5 mg total) by mouth 2 (two) times daily.   loratadine (CLARITIN) 10 MG tablet Take 10 mg by mouth daily.   multivitamin-lutein (OCUVITE-LUTEIN) CAPS capsule Take 1 capsule by mouth daily.   sertraline (ZOLOFT) 50 MG tablet TAKE 1 TABLET BY MOUTH DAILY. GENERIC EQUIVALENT FOR ZOLOFT   vitamin B-12 (CYANOCOBALAMIN) 500 MCG tablet Take 500 mcg by mouth daily.   vitamin C (ASCORBIC ACID) 500 MG tablet Take 500 mg by mouth daily.   No facility-administered encounter medications on file as of 05/31/2021.    Allergies (verified) Diphenhydramine hcl, Fenofibrate, Statins, and Zetia [ezetimibe]   History: Past Medical History:  Diagnosis Date   Allergic rhinitis, cause unspecified    CAD (coronary artery disease)    a. 03/2013 Abnl MV, EF 29%;  b. 03/2013 Cath/PCI: >M nl, LAD 99p (2.5x28 Promus Prem DES), LCX 100 after large OM1, RCA dominant, 20p, EF 30%.   Diverticulosis of colon    Hyperlipidemia    Hypertension    Ischemic cardiomyopathy    a. 03/2013 EF 30% by LV gram.;  b. Echo (12/14): Mod LVH with severe basal septal hypertrophy w/o LVOT obstr, EF 50-55%, normal wall motion, Gr 1 DD, MAC, mild MR, mild LAE, trivial eff.   Leiomyoma of uterus, unspecified    Unspecified hypothyroidism    Past Surgical History:  Procedure Laterality Date   ABDOMINAL  HYSTERECTOMY     fibroid   at  age 1   [place] N/A 04/26/2019   Procedure: APPENDECTOMY LAPAROSCOPIC;  Surgeon: Jules Husbands, MD;  Location: [facility] ORS;  Service: General;  Laterality: N/A;   LEFT HEART CATHETERIZATION WITH CORONARY ANGIOGRAM N/A 03/16/2013   Procedure: LEFT HEART CATHETERIZATION WITH CORONARY ANGIOGRAM;  Surgeon: [name] [name], MD;  Location: [facility] CATH LAB;  Service: Cardiovascular;  Laterality: N/A;   LEFT HEART CATHETERIZATION WITH CORONARY ANGIOGRAM N/A  05/23/2014   Procedure: LEFT HEART CATHETERIZATION WITH CORONARY ANGIOGRAM;  Surgeon: [name] [name], MD;  Location: [facility] CATH LAB;  Service: Cardiovascular;  Laterality: N/A;   PERCUTANEOUS CORONARY STENT INTERVENTION (PCI-S)  03/16/2013   Procedure: PERCUTANEOUS CORONARY STENT INTERVENTION (PCI-S);  Surgeon: [name] [name], MD;  Location: [facility] CATH LAB;  Service: Cardiovascular;;   TUBAL LIGATION     Family History  Problem Relation Age of Onset   Dementia Mother    Hyperlipidemia Mother    Hypertension Mother    Coronary artery disease Father    Hypertension Father    Hyperlipidemia Father    Uterine cancer Sister 75   Breast cancer Maternal Aunt    Social History   Socioeconomic History   Marital status: Married    Spouse name: Not on file   Number of children: 3   Years of education: Not on file   Highest education level: Not on file  Occupational History    Employer: RETIRED  Tobacco Use   Smoking status: Never   Smokeless tobacco: Never  Vaping Use   Vaping Use: Never used  Substance and Sexual Activity   Alcohol use: Yes    Comment: wine   Drug use: No   Sexual activity: Not Currently    Birth control/protection: Post-menopausal  Other Topics Concern   Not on file  Social History Narrative   High school graduate, married in [place], 3 sons ages 75, 46, 18; 4 grandchildren, 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   Social Determinants of Health   Financial Resource Strain: Medium Risk   Difficulty of Paying Living Expenses: Somewhat hard  Food Insecurity: No Food Insecurity   Worried About Charity fundraiser in the Last Year: Never true   Ran Out of Food in the Last Year: Never true  Transportation Needs: No Transportation Needs   Lack of Transportation (Medical): No   Lack of Transportation (Non-Medical): No  Physical Activity: Sufficiently Active   Days of Exercise per Week: 7 days   Minutes of Exercise per Session: 30 min  Stress: No Stress Concern  Present   Feeling of Stress : Not at all  Social Connections: Unknown   Frequency of Communication with Friends and Family: Not on file   Frequency of Social Gatherings with Friends and Family: Not on file   Attends Religious Services: Not on Electrical engineer or Organizations: Not on file   Attends Archivist Meetings: Not on file   Marital Status: Married    Tobacco Counseling Counseling given: Not Answered   Clinical Intake:  Pre-visit preparation completed: Yes        Diabetes: No  How often do you need to have someone help you when you read instructions, pamphlets, or other written materials from your doctor or pharmacy?: 1 - Never    Interpreter Needed?: No      Activities of Daily Living In your present state  of health, do you have any difficulty performing the following activities: 05/31/2021  Hearing? N  Vision? N  Difficulty concentrating or making decisions? N  Walking or climbing stairs? N  Dressing or bathing? N  Doing errands, shopping? N  Preparing Food and eating ? N  Using the Toilet? N  In the past six months, have you accidently leaked urine? N  Comment Managed by daily pad and kegal exercises  Do you have problems with loss of bowel control? N  Managing your Medications? N  Managing your Finances? N  Housekeeping or managing your Housekeeping? N  Some recent data might be hidden    Patient Care Team: [name], MD as PCP - General (Internal Medicine) [name], [name], MD as PCP - Cardiology (Cardiology) Judeth Horn, MD (General Surgery) Clent Jacks, MD (Ophthalmology) De Hollingshead, RPH-CPP (Pharmacist)  Indicate any recent Medical Services you may have received from other than [facility] providers in the past year (date may be approximate).     Assessment:   This is a routine wellness examination for [place].  Virtual Visit via Telephone Note I connected with  Veronica Barron on 05/31/21 at 12:30 PM EST  by telephone and verified that I am speaking with the correct person using two identifiers.  Location: Patient: home Provider: office Persons participating in the virtual visit: patient/Nurse Health Advisor   I discussed the limitations, risks, security and privacy concerns of performing an evaluation and management service by telephone and the availability of in person appointments. The patient expressed understanding and agreed to proceed.  Interactive audio and video telecommunications were attempted between this nurse and patient, however failed, due to patient having technical difficulties OR patient did not have access to video capability.  We continued and completed visit with audio only.  Some vital signs may be absent or patient reported.   Hearing/Vision screen Hearing Screening - Comments:: Patient is able to hear conversational tones without difficulty. No issues reported.  Vision Screening - Comments:: Followed by Dr. Marica Otter  Wears corrective lenses when reading Plans to schedule with the ophthalmologist.  Dietary issues and exercise activities discussed: Current Exercise Habits: Home exercise routine, Intensity: Mild Regular diet Good water intake   Goals Addressed             This Visit's Progress    Maintain Healthy Lifestyle       Healthy diet Exercise       Depression Screen PHQ 2/9 Scores 05/31/2021 04/23/2021 05/30/2020 05/30/2019 05/26/2018 05/25/2017 02/02/2017  PHQ - 2 Score 0 0 0 0 0 0 0  PHQ- 9 Score - - - - - 0 -    Fall Risk Fall Risk  05/31/2021 04/23/2021 05/30/2020 05/30/2019 05/30/2019  Falls in the past year? 0 0 0 - 0  Number falls in past yr: 0 - 0 - 0  Injury with Fall? - - 0 - -  Follow up Falls evaluation completed Falls evaluation completed Falls evaluation completed Falls evaluation completed Falls prevention discussed   [place]: Home free of loose throw rugs in walkways, pet beds,  electrical cords, etc? Yes  Adequate lighting in your home to reduce risk of falls? Yes   ASSISTIVE DEVICES UTILIZED TO PREVENT FALLS: Use of a cane, walker or w/c? No   TIMED UP AND GO: Was the test performed? No . Virtual visit.   Cognitive Function: MMSE - Mini Mental State Exam 05/19/2016  Orientation to time  5  Orientation to Place 5  Registration 3  Attention/ Calculation 5  Recall 3  Language- name 2 objects 2  Language- repeat 1  Language- follow 3 step command 3  Language- read & follow direction 1  Write a sentence 1  Copy design 1  Total score 30     6CIT Screen 05/31/2021 05/26/2018 05/25/2017  What Year? 0 points 0 points 0 points  What month? 0 points 0 points 0 points  What time? 0 points 0 points 0 points  Count back from 20 0 points 0 points 0 points  Months in reverse 0 points 0 points 0 points  Repeat phrase 0 points 0 points 0 points  Total Score 0 0 0    Immunizations Immunization History  Administered Date(s) Administered   Fluad Quad(high Dose 65+) 06/22/2020, 04/23/2021   Influenza Whole 07/14/2007, 05/15/2009, 06/13/2010   Influenza, High Dose Seasonal PF 04/29/2017, 05/26/2018   Influenza,inj,Quad PF,6+ Mos 03/09/2013, 04/04/2014, 04/24/2015, 04/25/2016   Influenza-Unspecified 05/19/2011   PFIZER(Purple Top)SARS-COV-2 Vaccination 08/04/2019, 08/25/2019   Pneumococcal Conjugate-13 03/15/2014   Pneumococcal Polysaccharide-23 05/25/2017   Tetanus 03/09/2013   Zoster, Live 11/01/2007   Shingrix Completed?: No.    Education has been provided regarding the importance of this vaccine. Patient has been advised to call insurance company to determine out of pocket expense if they have not yet received this vaccine. Advised may also receive vaccine at local pharmacy or Health Dept. Verbalized acceptance and understanding.  Screening Tests Health Maintenance  Topic Date Due   OPHTHALMOLOGY EXAM  Never done   COVID-19 Vaccine (3 - Pfizer risk series)  06/16/2021 (Originally 09/22/2019)   Zoster Vaccines- Shingrix (1 of 2) 08/31/2021 (Originally 06/20/1960)   HEMOGLOBIN A1C  10/22/2021   FOOT EXAM  04/23/2022   TETANUS/TDAP  03/10/2023   MAMMOGRAM  04/12/2023   Pneumonia Vaccine 79+ Years old  Completed   INFLUENZA VACCINE  Completed   DEXA SCAN  Completed   HPV VACCINES  Aged Out   Health Maintenance Health Maintenance Due  Topic Date Due   OPHTHALMOLOGY EXAM  Never done   Lung Cancer Screening: (Low Dose CT Chest recommended if Age 60-80 years, 30 pack-year currently smoking OR have quit w/in 15years.) does not qualify.   Hepatitis C Screening: does not qualify  Vision Screening: Recommended annual ophthalmology exams for early detection of glaucoma and other disorders of the eye.  Dental Screening: Recommended annual dental exams for proper oral hygiene  Community Resource Referral / Chronic Care Management: CRR required this visit?  No   CCM required this visit?  No      Plan:   Keep all routine maintenance appointments.   I have personally reviewed and noted the following in the patient's chart:   Medical and social history Use of alcohol, tobacco or illicit drugs  Current medications and supplements including opioid prescriptions. Not taking opioid.  Functional ability and status Nutritional status Physical activity Advanced directives List of other physicians Hospitalizations, surgeries, and ER visits in previous 12 months Vitals Screenings to include cognitive, depression, and falls Referrals and appointments  In addition, I have reviewed and discussed with patient certain preventive protocols, quality metrics, and best practice recommendations. A written personalized care plan for preventive services as well as general preventive health recommendations were provided to patient.     OBrien-Blaney, Siham Bucaro L, LPN   16/04/9603      I have reviewed the above information and agree with above.   Helene Kelp  Derrel Nip, MD

## 2021-05-31 NOTE — Patient Instructions (Addendum)
Veronica Barron , Thank you for taking time to come for your Medicare Wellness Visit. I appreciate your ongoing commitment to your health goals. Please review the following plan we discussed and let me know if I can assist you in the future.   These are the goals we discussed:  Goals       Patient Stated     Medication Management (pt-stated)      Patient Goals/Self-Care Activities patient will:  - take medications as prescribed collaborate with provider on medication access solutions       Other     Maintain Healthy Lifestyle      Healthy diet Exercise        This is a list of the screening recommended for you and due dates:  Health Maintenance  Topic Date Due   Eye exam for diabetics  Never done   COVID-19 Vaccine (3 - Pfizer risk series) 06/16/2021*   Zoster (Shingles) Vaccine (1 of 2) 08/31/2021*   Hemoglobin A1C  10/22/2021   Complete foot exam   04/23/2022   Tetanus Vaccine  03/10/2023   Mammogram  04/12/2023   Pneumonia Vaccine  Completed   Flu Shot  Completed   DEXA scan (bone density measurement)  Completed   HPV Vaccine  Aged Out  *Topic was postponed. The date shown is not the original due date.   Advanced directives: on file  Conditions/risks identified: none new  Follow up in one year for your annual wellness visit    Preventive Care 65 Years and Older, Female Preventive care refers to lifestyle choices and visits with your health care provider that can promote health and wellness. What does preventive care include? A yearly physical exam. This is also called an annual well check. Dental exams once or twice a year. Routine eye exams. Ask your health care provider how often you should have your eyes checked. Personal lifestyle choices, including: Daily care of your teeth and gums. Regular physical activity. Eating a healthy diet. Avoiding tobacco and drug use. Limiting alcohol use. Practicing safe sex. Taking low-dose aspirin every day. Taking  vitamin and mineral supplements as recommended by your health care provider. What happens during an annual well check? The services and screenings done by your health care provider during your annual well check will depend on your age, overall health, lifestyle risk factors, and family history of disease. Counseling  Your health care provider may ask you questions about your: Alcohol use. Tobacco use. Drug use. Emotional well-being. Home and relationship well-being. Sexual activity. Eating habits. History of falls. Memory and ability to understand (cognition). Work and work Statistician. Reproductive health. Screening  You may have the following tests or measurements: Height, weight, and BMI. Blood pressure. Lipid and cholesterol levels. These may be checked every 5 years, or more frequently if you are over 17 years old. Skin check. Lung cancer screening. You may have this screening every year starting at age 70 if you have a 30-pack-year history of smoking and currently smoke or have quit within the past 15 years. Fecal occult blood test (FOBT) of the stool. You may have this test every year starting at age 74. Flexible sigmoidoscopy or colonoscopy. You may have a sigmoidoscopy every 5 years or a colonoscopy every 10 years starting at age 26. Hepatitis C blood test. Hepatitis B blood test. Sexually transmitted disease (STD) testing. Diabetes screening. This is done by checking your blood sugar (glucose) after you have not eaten for a while (fasting). You may  have this done every 1-3 years. Bone density scan. This is done to screen for osteoporosis. You may have this done starting at age 35. Mammogram. This may be done every 1-2 years. Talk to your health care provider about how often you should have regular mammograms. Talk with your health care provider about your test results, treatment options, and if necessary, the need for more tests. Vaccines  Your health care provider may  recommend certain vaccines, such as: Influenza vaccine. This is recommended every year. Tetanus, diphtheria, and acellular pertussis (Tdap, Td) vaccine. You may need a Td booster every 10 years. Zoster vaccine. You may need this after age 32. Pneumococcal 13-valent conjugate (PCV13) vaccine. One dose is recommended after age 4. Pneumococcal polysaccharide (PPSV23) vaccine. One dose is recommended after age 74. Talk to your health care provider about which screenings and vaccines you need and how often you need them. This information is not intended to replace advice given to you by your health care provider. Make sure you discuss any questions you have with your health care provider. Document Released: 07/27/2015 Document Revised: 03/19/2016 Document Reviewed: 05/01/2015 Elsevier Interactive Patient Education  2017 [place] Prevention in the Home Falls can cause injuries. They can happen to people of all ages. There are many things you can do to make your home safe and to help prevent falls. What can I do on the outside of my home? Regularly fix the edges of walkways and driveways and fix any cracks. Remove anything that might make you trip as you walk through a door, such as a raised step or threshold. Trim any bushes or trees on the path to your home. Use bright outdoor lighting. Clear any walking paths of anything that might make someone trip, such as rocks or tools. Regularly check to see if handrails are loose or broken. Make sure that both sides of any steps have handrails. Any raised decks and porches should have guardrails on the edges. Have any leaves, snow, or ice cleared regularly. Use sand or salt on walking paths during winter. Clean up any spills in your garage right away. This includes oil or grease spills. What can I do in the bathroom? Use night lights. Install grab bars by the toilet and in the tub and shower. Do not use towel bars as grab bars. Use non-skid  mats or decals in the tub or shower. If you need to sit down in the shower, use a plastic, non-slip stool. Keep the floor dry. Clean up any water that spills on the floor as soon as it happens. Remove soap buildup in the tub or shower regularly. Attach bath mats securely with double-sided non-slip rug tape. Do not have throw rugs and other things on the floor that can make you trip. What can I do in the bedroom? Use night lights. Make sure that you have a light by your bed that is easy to reach. Do not use any sheets or blankets that are too big for your bed. They should not hang down onto the floor. Have a firm chair that has side arms. You can use this for support while you get dressed. Do not have throw rugs and other things on the floor that can make you trip. What can I do in the kitchen? Clean up any spills right away. Avoid walking on wet floors. Keep items that you use a lot in easy-to-reach places. If you need to reach something above you, use a strong step  stool that has a grab bar. Keep electrical cords out of the way. Do not use floor polish or wax that makes floors slippery. If you must use wax, use non-skid floor wax. Do not have throw rugs and other things on the floor that can make you trip. What can I do with my stairs? Do not leave any items on the stairs. Make sure that there are handrails on both sides of the stairs and use them. Fix handrails that are broken or loose. Make sure that handrails are as long as the stairways. Check any carpeting to make sure that it is firmly attached to the stairs. Fix any carpet that is loose or worn. Avoid having throw rugs at the top or bottom of the stairs. If you do have throw rugs, attach them to the floor with carpet tape. Make sure that you have a light switch at the top of the stairs and the bottom of the stairs. If you do not have them, ask someone to add them for you. What else can I do to help prevent falls? Wear shoes  that: Do not have high heels. Have rubber bottoms. Are comfortable and fit you well. Are closed at the toe. Do not wear sandals. If you use a stepladder: Make sure that it is fully opened. Do not climb a closed stepladder. Make sure that both sides of the stepladder are locked into place. Ask someone to hold it for you, if possible. Clearly mark and make sure that you can see: Any grab bars or handrails. First and last steps. Where the edge of each step is. Use tools that help you move around (mobility aids) if they are needed. These include: Canes. Walkers. Scooters. Crutches. Turn on the lights when you go into a dark area. Replace any light bulbs as soon as they burn out. Set up your furniture so you have a clear path. Avoid moving your furniture around. If any of your floors are uneven, fix them. If there are any pets around you, be aware of where they are. Review your medicines with your doctor. Some medicines can make you feel dizzy. This can increase your chance of falling. Ask your doctor what other things that you can do to help prevent falls. This information is not intended to replace advice given to you by your health care provider. Make sure you discuss any questions you have with your health care provider. Document Released: 04/26/2009 Document Revised: 12/06/2015 Document Reviewed: 08/04/2014 Elsevier Interactive Patient Education  2017 [employer].

## 2021-07-22 ENCOUNTER — Other Ambulatory Visit: Payer: Self-pay | Admitting: Internal Medicine

## 2021-08-24 ENCOUNTER — Other Ambulatory Visit: Payer: Self-pay | Admitting: Internal Medicine

## 2021-08-30 ENCOUNTER — Other Ambulatory Visit: Payer: Self-pay

## 2021-08-30 MED ORDER — LISINOPRIL 5 MG PO TABS: 5.0000 mg | ORAL_TABLET | Freq: Two times a day (BID) | ORAL | 11 refills | Status: DC

## 2021-09-18 NOTE — Progress Notes (Signed)
? ?Veronica Barron ?Date of Birth: [birth year] ?Medical Record #[id number] ? ?History of Present Illness: ?Veronica Barron is seen today for followup CAD. She has a history of hypertension, hyperlipidemia, and hypothyroidism. In Sept 2014 she had stenting of the proximal LAD with DES.  The distal left circumflex was occluded with left to left collaterals and right-to-left collaterals. The right coronary was without significant disease. Ejection fraction was 30%.  Her other disease was treated medically. On followup Echo in Dec. 2014 EF had improved to 50-55%. In November 2015 she had a significant episode of chest pain. Repeat cardiac cath showed occlusion of the distal LCx and the stent in the LAD was patent.  ? ?She has a history of intolerance to multiple statins, Zetia and Lopid, and fenofibrates. She was previously unable afford a PCSK 9 inhibitor and was seen by Pharm D and it was authorized and she was taking it with help from patient assistance foundation. ? ?She was admitted in October 2020 with acute appendicitis. She had appy without complications.  ? ?On follow up today she reports she is feeling very well. No chest pain or SOB. She is still quite active with her walking and gardening. Good energy. Unfortunately assistance from the foundation has run out so she is having to come off the Repatha due to cost.  ? ? ?Current Outpatient Medications on File Prior to Visit  ?Medication Sig Dispense Refill  ? ALFALFA PO Take 1 tablet by mouth daily.    ? aspirin EC 81 MG tablet Take 81 mg by mouth daily.    ? carvedilol (COREG) 6.25 MG tablet TAKE ONE TABLET BY MOUTH TWICE DAILY WITH MEALS 180 tablet 3  ? Evolocumab (REPATHA SURECLICK) 701 MG/ML SOAJ INJECT ONE SYRINGEFUL INTO THE SKIN EVERY 14 DAYS 2 mL 11  ? levothyroxine (SYNTHROID) 88 MCG tablet TAKE 1 TABLET BY MOUTH DAILY BEFORE BREAKFAST 90 tablet 1  ? lisinopril (ZESTRIL) 5 MG tablet Take 1 tablet (5 mg total) by mouth 2 (two) times daily. 60 tablet 11  ?  loratadine (CLARITIN) 10 MG tablet Take 10 mg by mouth daily.    ? multivitamin-lutein (OCUVITE-LUTEIN) CAPS capsule Take 1 capsule by mouth daily.    ? sertraline (ZOLOFT) 50 MG tablet TAKE 1 TABLET BY MOUTH DAILY GENERIC EQUIVALENT FOR ZOLOFT 90 tablet 0  ? vitamin B-12 (CYANOCOBALAMIN) 500 MCG tablet Take 500 mcg by mouth daily.    ? vitamin C (ASCORBIC ACID) 500 MG tablet Take 500 mg by mouth daily.    ? ?No current facility-administered medications on file prior to visit.  ? ? ?Allergies  ?Allergen Reactions  ? Diphenhydramine Hcl   ?  REACTION: TACHYCARDIA  ? Fenofibrate Other (See Comments)  ?  constipation  ? Statins   ?  Myalgia ?  ? Zetia [Ezetimibe] Other (See Comments)  ?  Myalgias ?  ? ? ?Past Medical History:  ?Diagnosis Date  ? Allergic rhinitis, cause unspecified   ? CAD (coronary artery disease)   ? a. 03/2013 Abnl MV, EF 29%;  b. 03/2013 Cath/PCI: >M nl, LAD 99p (2.5x28 Promus Prem DES), LCX 100 after large OM1, RCA dominant, 20p, EF 30%.  ? Diverticulosis of colon   ? Hyperlipidemia   ? Hypertension   ? Ischemic cardiomyopathy   ? a. 03/2013 EF 30% by LV gram.;  b. Echo (12/14): Mod LVH with severe basal septal hypertrophy w/o LVOT obstr, EF 50-55%, normal wall motion, Gr 1 DD, MAC, mild MR, mild LAE,  trivial eff.  ? Leiomyoma of uterus, unspecified   ? Unspecified hypothyroidism   ? ? ?Past Surgical History:  ?Procedure Laterality Date  ? ABDOMINAL HYSTERECTOMY    ? fibroid   at  age 16  ? CHOLECYSTECTOMY    ? LAPAROSCOPIC APPENDECTOMY N/A 04/26/2019  ? Procedure: APPENDECTOMY LAPAROSCOPIC;  Surgeon: Jules Husbands, MD;  Location: [facility] ORS;  Service: General;  Laterality: N/A;  ? LEFT HEART CATHETERIZATION WITH CORONARY ANGIOGRAM N/A 03/16/2013  ? Procedure: LEFT HEART CATHETERIZATION WITH CORONARY ANGIOGRAM;  Surgeon: Robson Trickey M [name], MD;  Location: [facility] CATH LAB;  Service: Cardiovascular;  Laterality: N/A;  ? LEFT HEART CATHETERIZATION WITH CORONARY ANGIOGRAM N/A 05/23/2014  ? Procedure: LEFT HEART  CATHETERIZATION WITH CORONARY ANGIOGRAM;  Surgeon: Dylan Ruotolo M [name], MD;  Location: [facility] CATH LAB;  Service: Cardiovascular;  Laterality: N/A;  ? PERCUTANEOUS CORONARY STENT INTERVENTION (PCI-S)  03/16/2013  ? Procedure: PERCUTANEOUS CORONARY STENT INTERVENTION (PCI-S);  Surgeon: Json Koelzer M [name], MD;  Location: [facility] CATH LAB;  Service: Cardiovascular;;  ? TUBAL LIGATION    ? ? ?Social History  ? ?Tobacco Use  ?Smoking Status Never  ?Smokeless Tobacco Never  ? ? ?Social History  ? ?Substance and Sexual Activity  ?Alcohol Use Yes  ? Comment: wine  ? ? ?Family History  ?Problem Relation Age of Onset  ? Dementia Mother   ? Hyperlipidemia Mother   ? Hypertension Mother   ? Coronary artery disease Father   ? Hypertension Father   ? Hyperlipidemia Father   ? Uterine cancer Sister 57  ? Breast cancer Maternal Aunt   ? ? ?Review of Systems: ?As noted in history of present illness.  All other systems were reviewed and are negative. ? ?Physical Exam: ?There were no vitals taken for this visit. ?GENERAL:  Well appearing WF in NAD ?HEENT:  PERRL, EOMI, sclera are clear. Oropharynx is clear. ?NECK:  No jugular venous distention, carotid upstroke brisk and symmetric, no bruits, no thyromegaly or adenopathy ?LUNGS:  Clear to auscultation bilaterally ?CHEST:  Unremarkable ?HEART:  RRR,  PMI not displaced or sustained,S1 and S2 within normal limits, no S3, no S4: no clicks, no rubs, no murmurs ?ABD:  Soft, nontender. BS +, no masses or bruits. No hepatomegaly, no splenomegaly ?EXT:  2 + pulses throughout, no edema, no cyanosis no clubbing ?SKIN:  Warm and dry.  No rashes ?NEURO:  Alert and oriented x 3. Cranial nerves II through XII intact. ?PSYCH:  Cognitively intact ? ?LABORATORY DATA: ?Lab Results  ?Component Value Date  ? WBC 6.6 06/22/2020  ? HGB 12.7 06/22/2020  ? HCT 39.5 06/22/2020  ? PLT 312.0 06/22/2020  ? GLUCOSE 89 04/23/2021  ? CHOL 232 (H) 04/23/2021  ? TRIG 282 (H) 04/23/2021  ? HDL 53 04/23/2021  ? LDLDIRECT 34.0 12/25/2020  ?  LDLCALC 136 (H) 04/23/2021  ? ALT 15 04/23/2021  ? AST 21 04/23/2021  ? NA 138 04/23/2021  ? K 4.1 04/23/2021  ? CL 103 04/23/2021  ? CREATININE 0.82 04/23/2021  ? BUN 16 04/23/2021  ? CO2 26 04/23/2021  ? TSH 0.75 12/25/2020  ? INR 0.92 05/17/2014  ? HGBA1C 6.4 04/23/2021  ? MICROALBUR <0.7 04/23/2021  ? 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 Rate 66. Twave inversion laterally. No change.   I have personally reviewed and interpreted this study. ? ?Assessment / Plan: ?1. Coronary disease status post stenting of the proximal LAD in September 2014 with drug-eluting stent. Chronic total occlusion of the left  circumflex after the first obtuse marginal vessel. Cardiac cath in November 2015 showed continued stent patency. She is asymptomatic.  Continue current therapy. On ASA, Coreg and ACEi.  ? ?2. Hyperlipidemia. History of intolerance to multiple statins, Zetia, fenofibrates, and Lopid. Excellent response to Repatha with marked reduction in LDL to 37. Tolerating well.  ? ?3. Hypertension- good control. Continue Rx. ? ?4. Hypothyroidism. TSH normal.  ? ?Follow up in 6 months ? ?

## 2021-09-19 ENCOUNTER — Ambulatory Visit: Payer: Self-pay | Admitting: Pharmacist

## 2021-09-19 NOTE — Chronic Care Management (AMB) (Signed)
?  Chronic Care Management  ? ?Note ? ?09/19/2021 ?Name: RAENAH MURLEY MRN: [id number] DOB: 05/18/1941 ? ? ? ?Closing pharmacy CCM case at this time. Patient has clinic contact information for future questions or concerns.  ? ?[name], PharmD, [place], CPP ?Clinical Pharmacist ?Therapist, music at [employer] ?[phone] ? ?

## 2021-09-23 ENCOUNTER — Ambulatory Visit: Payer: Medicare Other | Admitting: Cardiology

## 2021-09-23 ENCOUNTER — Encounter: Payer: Self-pay | Admitting: Cardiology

## 2021-09-23 ENCOUNTER — Other Ambulatory Visit: Payer: Self-pay

## 2021-09-23 VITALS — BP 125/65 | HR 68 | Ht 63.0 in | Wt 148.6 lb

## 2021-09-23 DIAGNOSIS — E7800 Pure hypercholesterolemia, unspecified: Secondary | ICD-10-CM

## 2021-09-23 DIAGNOSIS — I10 Essential (primary) hypertension: Secondary | ICD-10-CM | POA: Diagnosis not present

## 2021-09-23 DIAGNOSIS — I2510 Atherosclerotic heart disease of native coronary artery without angina pectoris: Secondary | ICD-10-CM

## 2021-11-29 ENCOUNTER — Other Ambulatory Visit: Payer: Self-pay

## 2021-11-29 MED ORDER — SERTRALINE HCL 50 MG PO TABS: ORAL_TABLET | ORAL | 1 refills | Status: DC

## 2021-12-02 ENCOUNTER — Telehealth: Payer: Self-pay | Admitting: Internal Medicine

## 2021-12-02 ENCOUNTER — Other Ambulatory Visit: Payer: Self-pay

## 2021-12-02 MED ORDER — SERTRALINE HCL 50 MG PO TABS: ORAL_TABLET | ORAL | 1 refills | Status: DC

## 2021-12-02 MED ORDER — SERTRALINE HCL 50 MG PO TABS: ORAL_TABLET | ORAL | 0 refills | Status: DC

## 2021-12-02 NOTE — Telephone Encounter (Signed)
Patient called and is requesting a refill on her carvedilol (COREG) 6.25 MG tablet. She would like a small about of medication sent to [facility] on [address] and then send 90 days to her mail order pharmacy.

## 2021-12-02 NOTE — Telephone Encounter (Signed)
Spoke with pt to let her know that the carvedilol will need to be refilled by her cardiologist. Pt stated that she needs the sertraline refilled not the carvedilol. Medication has been refilled.

## 2022-01-13 ENCOUNTER — Other Ambulatory Visit: Payer: Self-pay | Admitting: Internal Medicine

## 2022-01-15 NOTE — Telephone Encounter (Signed)
Patient called and she is scheduled 8/8 to follow-up with Dr. Derrel Nip. Will refill levothyroxine until patient is seen.

## 2022-01-24 ENCOUNTER — Other Ambulatory Visit: Payer: Self-pay | Admitting: Cardiology

## 2022-02-18 ENCOUNTER — Ambulatory Visit: Payer: Medicare Other | Admitting: Internal Medicine

## 2022-02-18 ENCOUNTER — Encounter: Payer: Self-pay | Admitting: Internal Medicine

## 2022-02-18 VITALS — BP 128/78 | HR 70 | Temp 97.5°F | Ht 63.0 in | Wt 149.6 lb

## 2022-02-18 DIAGNOSIS — E781 Pure hyperglyceridemia: Secondary | ICD-10-CM

## 2022-02-18 DIAGNOSIS — E559 Vitamin D deficiency, unspecified: Secondary | ICD-10-CM

## 2022-02-18 DIAGNOSIS — I25118 Atherosclerotic heart disease of native coronary artery with other forms of angina pectoris: Secondary | ICD-10-CM | POA: Diagnosis not present

## 2022-02-18 DIAGNOSIS — Z1231 Encounter for screening mammogram for malignant neoplasm of breast: Secondary | ICD-10-CM | POA: Diagnosis not present

## 2022-02-18 DIAGNOSIS — Z780 Asymptomatic menopausal state: Secondary | ICD-10-CM | POA: Diagnosis not present

## 2022-02-18 DIAGNOSIS — I10 Essential (primary) hypertension: Secondary | ICD-10-CM

## 2022-02-18 DIAGNOSIS — E034 Atrophy of thyroid (acquired): Secondary | ICD-10-CM

## 2022-02-18 DIAGNOSIS — R7303 Prediabetes: Secondary | ICD-10-CM | POA: Diagnosis not present

## 2022-02-18 DIAGNOSIS — Z0000 Encounter for general adult medical examination without abnormal findings: Secondary | ICD-10-CM

## 2022-02-18 DIAGNOSIS — I700 Atherosclerosis of aorta: Secondary | ICD-10-CM

## 2022-02-18 MED ORDER — ZOSTER VAC RECOMB ADJUVANTED 50 MCG/0.5ML IM SUSR: 0.5000 mL | Freq: Once | INTRAMUSCULAR | 1 refills | Status: AC

## 2022-02-18 NOTE — Assessment & Plan Note (Signed)
SHE IS asyptomatic with activities and tolerating repatha,  Coreg and lisinopril and aspirin

## 2022-02-18 NOTE — Assessment & Plan Note (Signed)
Well controlled on current regimen of lisinopril and carvedilol. Renal function is due ,  no changes today.

## 2022-02-18 NOTE — Assessment & Plan Note (Signed)
Mammogram and DEXA scan ordered.  Shingrix vaccine recommended.

## 2022-02-18 NOTE — Assessment & Plan Note (Signed)
Thyroid function was previously normal on current dose.  Repeat level pending   Lab Results  Component Value Date   TSH 0.75 12/25/2020

## 2022-02-18 NOTE — Progress Notes (Signed)
Subjective:  Patient ID: Veronica Barron, female    DOB: [birth year]  Age: 81 y.o. MRN: [id number]  CC: The primary encounter diagnosis was Asymptomatic postmenopausal estrogen deficiency. Diagnoses of Coronary artery disease of native artery of native heart with stable angina pectoris ([place]), Breast cancer screening by mammogram, Prediabetes, Hypothyroidism due to acquired atrophy of thyroid, Vitamin D deficiency, Essential hypertension, Encounter for preventive health examination, and Aortic atherosclerosis ([place]) were also pertinent to this visit.   HPI Veronica Barron presents for  Chief Complaint  Patient presents with   Follow-up   1) Aortic  atherosclerosis:  Reviewed findings of prior CT scan today..  Patient  Is INTOLERANT OF STATIN but taking Repatha.   Last dose nearly 2 weeks ago.   Tolerating well.    2) Hypertension:  taking carvedilol and lisinopril. Home readings not done.   3) Hypothyroidisim   taking Synthroid. Needs TSH   Outpatient Medications Prior to Visit  Medication Sig Dispense Refill   ALFALFA PO Take 1 tablet by mouth daily.     aspirin EC 81 MG tablet Take 81 mg by mouth daily.     carvedilol (COREG) 6.25 MG tablet TAKE 1 TABLET BY MOUTH TWICE DAILY WITH MEALS 180 tablet 3   Evolocumab (REPATHA SURECLICK) 086 MG/ML SOAJ INJECT ONE SYRINGEFUL INTO THE SKIN EVERY 14 DAYS 2 mL 11   levothyroxine (SYNTHROID) 88 MCG tablet TAKE 1 TABLET BY MOUTH DAILY BEFORE BREAKFAST 30 tablet 1   lisinopril (ZESTRIL) 5 MG tablet Take 1 tablet (5 mg total) by mouth 2 (two) times daily. 60 tablet 11   loratadine (CLARITIN) 10 MG tablet Take 10 mg by mouth daily.     multivitamin-lutein (OCUVITE-LUTEIN) CAPS capsule Take 1 capsule by mouth daily.     sertraline (ZOLOFT) 50 MG tablet TAKE 1 TABLET BY MOUTH DAILY GENERIC EQUIVALENT FOR ZOLOFT 90 tablet 1   vitamin B-12 (CYANOCOBALAMIN) 500 MCG tablet Take 500 mcg by mouth daily.     vitamin C (ASCORBIC ACID) 500 MG tablet Take 500 mg  by mouth daily.     No facility-administered medications prior to visit.    Review of Systems;  Patient denies headache, fevers, malaise, unintentional weight loss, skin rash, eye pain, sinus congestion and sinus pain, sore throat, dysphagia,  hemoptysis , cough, dyspnea, wheezing, chest pain, palpitations, orthopnea, edema, abdominal pain, nausea, melena, diarrhea, constipation, flank pain, dysuria, hematuria, urinary  Frequency, nocturia, numbness, tingling, seizures,  Focal weakness, Loss of consciousness,  Tremor, insomnia, depression, anxiety, and suicidal ideation.      Objective:  BP 128/78 (BP Location: Left Arm, Patient Position: Sitting, Cuff Size: Normal)   Pulse 70   Temp (!) 97.5 F (36.4 C) (Oral)   Ht '5\' 3"'$  (1.6 m)   Wt 149 lb 9.6 oz (67.9 kg)   BMI 26.50 kg/m   BP Readings from Last 3 Encounters:  02/18/22 128/78  09/23/21 125/65  04/23/21 126/68    Wt Readings from Last 3 Encounters:  02/18/22 149 lb 9.6 oz (67.9 kg)  09/23/21 148 lb 9.6 oz (67.4 kg)  05/31/21 148 lb (67.1 kg)    General appearance: alert, cooperative and appears stated age Ears: normal TM's and external ear canals both ears Throat: lips, mucosa, and tongue normal; teeth and gums normal Neck: no adenopathy, no carotid bruit, supple, symmetrical, trachea midline and thyroid not enlarged, symmetric, no tenderness/mass/nodules Back: symmetric, no curvature. ROM normal. No CVA tenderness. Lungs: clear to auscultation bilaterally Heart: regular  rate and rhythm, S1, S2 normal, no murmur, click, rub or gallop Abdomen: soft, non-tender; bowel sounds normal; no masses,  no organomegaly Pulses: 2+ and symmetric Skin: Skin color, texture, turgor normal. No rashes or lesions Lymph nodes: Cervical, supraclavicular, and axillary nodes normal.  Lab Results  Component Value Date   HGBA1C 6.4 04/23/2021   HGBA1C 6.6 (H) 12/25/2020   HGBA1C 6.4 06/22/2020    Lab Results  Component Value Date    CREATININE 0.82 04/23/2021   CREATININE 0.77 12/25/2020   CREATININE 0.76 06/22/2020    Lab Results  Component Value Date   WBC 6.6 06/22/2020   HGB 12.7 06/22/2020   HCT 39.5 06/22/2020   PLT 312.0 06/22/2020   GLUCOSE 89 04/23/2021   CHOL 232 (H) 04/23/2021   TRIG 282 (H) 04/23/2021   HDL 53 04/23/2021   LDLDIRECT 34.0 12/25/2020   LDLCALC 136 (H) 04/23/2021   ALT 15 04/23/2021   AST 21 04/23/2021   NA 138 04/23/2021   K 4.1 04/23/2021   CL 103 04/23/2021   CREATININE 0.82 04/23/2021   BUN 16 04/23/2021   CO2 26 04/23/2021   TSH 0.75 12/25/2020   INR 0.92 05/17/2014   HGBA1C 6.4 04/23/2021   MICROALBUR <0.7 04/23/2021    MM 3D SCREEN BREAST BILATERAL  Result Date: 04/17/2021 CLINICAL DATA:  Screening. EXAM: DIGITAL SCREENING BILATERAL MAMMOGRAM WITH TOMOSYNTHESIS AND CAD TECHNIQUE: Bilateral screening digital craniocaudal and mediolateral oblique mammograms were obtained. Bilateral screening digital breast tomosynthesis was performed. The images were evaluated with computer-aided detection. COMPARISON:  Previous exam(s). ACR Breast Density Category b: There are scattered areas of fibroglandular density. FINDINGS: There are no findings suspicious for malignancy. IMPRESSION: No mammographic evidence of malignancy. A result letter of this screening mammogram will be mailed directly to the patient. RECOMMENDATION: Screening mammogram in one year. (Code:SM-B-01Y) BI-RADS CATEGORY  1: Negative. Electronically Signed   By: Ammie Ferrier M.D.   On: 04/17/2021 07:58    Assessment & Plan:   Problem List Items Addressed This Visit     Hypothyroidism    Thyroid function was previously normal on current dose.  Repeat level pending   Lab Results  Component Value Date   TSH 0.75 12/25/2020         Relevant Orders   TSH   Essential hypertension    Well controlled on current regimen of lisinopril and carvedilol. Renal function is due ,  no changes today.      Encounter  for preventive health examination    Mammogram and DEXA scan ordered.  Shingrix vaccine recommended.       CAD (coronary artery disease)    SHE IS asyptomatic with activities and tolerating repatha,  Coreg and lisinopril and aspirin       Vitamin D deficiency   Relevant Orders   VITAMIN D 25 Hydroxy (Vit-D Deficiency, Fractures)   Prediabetes   Relevant Orders   Lipid panel   Comprehensive metabolic panel   Hemoglobin A1c   Aortic atherosclerosis (HCC)     Aortic atherosclerosis:  Reviewed findings of prior CT scan today..  Patient  Is intolerant of statins,  zetia and fenofibrate   due to myalgias,  .she has resumed  Repatha       Asymptomatic postmenopausal estrogen deficiency - Primary   Relevant Orders   DG Bone Density   Other Visit Diagnoses     Breast cancer screening by mammogram       Relevant Orders   MM 3D  SCREEN BREAST BILATERAL       I spent a total of  27 minutes with this patient in a face to face visit on the date of this encounter reviewing the last office visit with me ,  her most recent with patient's cardiologist  her last  cardiac catheterization    ,  patient'ss diet and eating habits, home blood pressure readings ,  most recent imaging study , and post visit ordering of testing and therapeutics.  ,   Follow-up: No follow-ups on file.   [name], MD

## 2022-02-18 NOTE — Assessment & Plan Note (Signed)
  Aortic atherosclerosis:  Reviewed findings of prior CT scan today..  Patient  Is intolerant of statins,  zetia and fenofibrate   due to myalgias,  .she has resumed  Repatha

## 2022-02-18 NOTE — Patient Instructions (Addendum)
  Your  annual mammogram and a repeat DEXA scan   have been ordered.  You are encouraged (required) to call to schedule your own  appointment at [facility]  , and their phone number is [phone]  DO NOT SCHEDULE ON THE SAME DAY.   I recommend getting the half of your calcium and Vitamin D  Requirements through dietary sources rather than supplements given the recent association of calcium supplements with increased coronary artery calcium scores (You need 1200 mg daily )   Unsweetened almond/coconut milk, Cashew and soy  Milks are all great low calorie low carb, cholesterol free sources of dietary calcium and vitamin D.

## 2022-02-19 LAB — COMPREHENSIVE METABOLIC PANEL
ALT: 16 U/L (ref 0–35)
AST: 21 U/L (ref 0–37)
Albumin: 4.2 g/dL (ref 3.5–5.2)
Alkaline Phosphatase: 56 U/L (ref 39–117)
BUN: 17 mg/dL (ref 6–23)
CO2: 27 mEq/L (ref 19–32)
Calcium: 9.8 mg/dL (ref 8.4–10.5)
Chloride: 103 mEq/L (ref 96–112)
Creatinine, Ser: 0.8 mg/dL (ref 0.40–1.20)
GFR: 68.98 mL/min (ref >60.00)
Glucose, Bld: 91 mg/dL (ref 70–99)
Potassium: 4.3 mEq/L (ref 3.5–5.1)
Sodium: 139 mEq/L (ref 135–145)
Total Bilirubin: 0.4 mg/dL (ref 0.2–1.2)
Total Protein: 6.5 g/dL (ref 6.0–8.3)

## 2022-02-19 LAB — LIPID PANEL
Cholesterol: 129 mg/dL (ref 0–200)
HDL: 50.8 mg/dL (ref >39.00)
NonHDL: 78.11
Total CHOL/HDL Ratio: 3
Triglycerides: 370 mg/dL — ABNORMAL HIGH (ref 0.0–149.0)
VLDL: 74 mg/dL — ABNORMAL HIGH (ref 0.0–40.0)

## 2022-02-19 LAB — LDL CHOLESTEROL, DIRECT: Direct LDL: 40 mg/dL

## 2022-02-19 LAB — VITAMIN D 25 HYDROXY (VIT D DEFICIENCY, FRACTURES): VITD: 76.76 ng/mL (ref 30.00–100.00)

## 2022-02-19 LAB — HEMOGLOBIN A1C: Hgb A1c MFr Bld: 6.3 % (ref 4.6–6.5)

## 2022-02-19 LAB — TSH: TSH: 0.44 u[IU]/mL (ref 0.35–5.50)

## 2022-02-20 ENCOUNTER — Telehealth: Payer: Self-pay

## 2022-02-20 NOTE — Addendum Note (Signed)
Addended by: [name] on: 02/20/2022 11:48 AM   Modules accepted: Orders

## 2022-02-20 NOTE — Telephone Encounter (Signed)
LMTCB for lab results.  

## 2022-02-20 NOTE — Telephone Encounter (Signed)
Patient returned Sarah's call

## 2022-02-24 ENCOUNTER — Telehealth: Payer: Self-pay

## 2022-02-24 NOTE — Telephone Encounter (Signed)
error 

## 2022-03-06 ENCOUNTER — Telehealth: Payer: Self-pay | Admitting: Internal Medicine

## 2022-03-06 NOTE — Telephone Encounter (Signed)
Pt called stating she is going to the beach next weekend and she want to take her dog. Pt want to know if the provider can write an emotional support letter for her dog

## 2022-03-07 NOTE — Telephone Encounter (Signed)
Noted  

## 2022-03-07 NOTE — Telephone Encounter (Signed)
Patient returned office phone call and note from Dr Derrel Nip was read.

## 2022-03-07 NOTE — Telephone Encounter (Signed)
LMTCB

## 2022-03-24 ENCOUNTER — Other Ambulatory Visit: Payer: Self-pay | Admitting: Internal Medicine

## 2022-04-16 ENCOUNTER — Other Ambulatory Visit: Payer: Self-pay | Admitting: Internal Medicine

## 2022-04-16 DIAGNOSIS — Z1231 Encounter for screening mammogram for malignant neoplasm of breast: Secondary | ICD-10-CM

## 2022-05-05 ENCOUNTER — Other Ambulatory Visit: Payer: Self-pay | Admitting: Cardiovascular Disease

## 2022-05-05 DIAGNOSIS — T466X5A Adverse effect of antihyperlipidemic and antiarteriosclerotic drugs, initial encounter: Secondary | ICD-10-CM

## 2022-05-05 DIAGNOSIS — I700 Atherosclerosis of aorta: Secondary | ICD-10-CM

## 2022-05-05 DIAGNOSIS — I25118 Atherosclerotic heart disease of native coronary artery with other forms of angina pectoris: Secondary | ICD-10-CM

## 2022-05-16 ENCOUNTER — Other Ambulatory Visit: Payer: Self-pay | Admitting: Internal Medicine

## 2022-05-19 ENCOUNTER — Telehealth: Payer: Self-pay | Admitting: Cardiology

## 2022-05-19 DIAGNOSIS — M791 Myalgia, unspecified site: Secondary | ICD-10-CM

## 2022-05-19 DIAGNOSIS — I25118 Atherosclerotic heart disease of native coronary artery with other forms of angina pectoris: Secondary | ICD-10-CM

## 2022-05-19 DIAGNOSIS — I700 Atherosclerosis of aorta: Secondary | ICD-10-CM

## 2022-05-19 NOTE — Telephone Encounter (Signed)
Pt c/o medication issue:  1. Name of Medication:   Evolocumab (REPATHA SURECLICK) 110 MG/ML SOAJ    2. How are you currently taking this medication (dosage and times per day)?   Inject '140mg'$  under the skin every 14 days    3. Are you having a reaction (difficulty breathing--STAT)? No  4. What is your medication issue? Pt would like ro know why she does not have any refills on medication. Please advise

## 2022-05-19 NOTE — Telephone Encounter (Signed)
Patient she has been out of repatha for over 2 weeks. Upon chart review, there were no refills sent to her pharmacy. She asked that PharmD K. Alvstad call her at [phone].

## 2022-05-20 MED ORDER — REPATHA SURECLICK 140 MG/ML ~~LOC~~ SOAJ: SUBCUTANEOUS | 3 refills | Status: DC

## 2022-05-20 NOTE — Telephone Encounter (Signed)
Repatha PA renewed to 05/20/2023  Key [id number]  Refill sent to [facility] for patient to return call

## 2022-05-21 ENCOUNTER — Encounter: Payer: Self-pay | Admitting: Internal Medicine

## 2022-05-21 ENCOUNTER — Ambulatory Visit (INDEPENDENT_AMBULATORY_CARE_PROVIDER_SITE_OTHER): Payer: Medicare Other | Admitting: Internal Medicine

## 2022-05-21 ENCOUNTER — Telehealth: Payer: Self-pay

## 2022-05-21 VITALS — BP 126/74 | HR 68 | Temp 98.1°F | Ht 63.0 in | Wt 146.2 lb

## 2022-05-21 DIAGNOSIS — Z23 Encounter for immunization: Secondary | ICD-10-CM

## 2022-05-21 DIAGNOSIS — Z0000 Encounter for general adult medical examination without abnormal findings: Secondary | ICD-10-CM

## 2022-05-21 DIAGNOSIS — I10 Essential (primary) hypertension: Secondary | ICD-10-CM | POA: Diagnosis not present

## 2022-05-21 DIAGNOSIS — G629 Polyneuropathy, unspecified: Secondary | ICD-10-CM

## 2022-05-21 DIAGNOSIS — E034 Atrophy of thyroid (acquired): Secondary | ICD-10-CM | POA: Diagnosis not present

## 2022-05-21 DIAGNOSIS — I700 Atherosclerosis of aorta: Secondary | ICD-10-CM

## 2022-05-21 NOTE — Assessment & Plan Note (Signed)

## 2022-05-21 NOTE — Assessment & Plan Note (Signed)
Managed with nonpharmacologic treaments

## 2022-05-21 NOTE — Telephone Encounter (Signed)
PA for Repatha has been submitted on covermyemds.

## 2022-05-21 NOTE — Assessment & Plan Note (Signed)
Aortic atherosclerosis:  Reviewed findings of prior CT scan today..  Patient  Is intolerant of statins,  zetia and fenofibrate   due to myalgias,  but tolerating  Repatha

## 2022-05-21 NOTE — Patient Instructions (Addendum)
Your  DEXA scan  was ordered in August.  You are encouraged (required) to call to schedule your own  appointment at [facility],  and their phone number is [phone]

## 2022-05-21 NOTE — Assessment & Plan Note (Signed)
she reports compliance with medication regimen and home readings < 130/80  on  a consistent basis.

## 2022-05-21 NOTE — Progress Notes (Signed)
Patient ID: Veronica Barron, female    DOB: [birth year]  Age: 81 y.o. MRN: [id number]  The patient is here for annual PREVENTIVE examination and management of other chronic and acute problems.   The risk factors are reflected in the social history.  The roster of all physicians providing medical care to patient - is listed in the Snapshot section of the chart.  Activities of daily living:  The patient is 100% independent in all ADLs: dressing, toileting, feeding as well as independent mobility  Home safety : The patient has smoke detectors in the home. They wear seatbelts.  There are no firearms at home. There is no violence in the home.   There is no risks for hepatitis, STDs or HIV. There is no   history of blood transfusion. They have no travel history to infectious disease endemic areas of the world.  The patient has seen their dentist in the last six month. They have seen their eye doctor in the last year. They admit to slight hearing difficulty with regard to whispered voices and some television programs.  They have deferred audiologic testing in the last year.  They do not  have excessive sun exposure. Discussed the need for sun protection: hats, long sleeves and use of sunscreen if there is significant sun exposure.   Diet: the importance of a healthy diet is discussed. They do have a healthy diet.  The benefits of regular aerobic exercise were discussed. She walks 4 times per week ,  20 minutes.   Depression screen: there are no signs or vegative symptoms of depression- irritability, change in appetite, anhedonia, sadness/tearfullness.  Cognitive assessment: the patient manages all their financial and personal affairs and is actively engaged. They could relate day,date,year and events; recalled 2/3 objects at 3 minutes; performed clock-face test normally.  The following portions of the patient's history were reviewed and updated as appropriate: allergies, current medications, past family  history, past medical history,  past surgical history, past social history  and problem list.  Visual acuity was not assessed per patient preference since she has regular follow up with her ophthalmologist. Hearing and body mass index were assessed and reviewed.   During the course of the visit the patient was educated and counseled about appropriate screening and preventive services including : fall prevention , diabetes screening, nutrition counseling, colorectal cancer screening, and recommended immunizations.    CC: The primary encounter diagnosis was Essential hypertension. Diagnoses of Hypothyroidism due to acquired atrophy of thyroid, White coat syndrome with diagnosis of hypertension, Need for immunization against influenza, Encounter for preventive health examination, Aortic atherosclerosis ([place]), and Neuropathy were also pertinent to this visit.  1) HTN:  home blood pressure readings 124/78.  She has white coat syndrome   2) NEUROPATHY : MANAGED WITH WATER BATH  AND  RED LIGHT THERAPY (IN A SHOE).  ATTRIBUTED TO STATIN  DRUGS   3) taking repatha for AA and hyperlipidemia; statin intolerant   4) Prediabetes :  reviewed diet .  Cheerios with skim mild, fresh fruit and OJ. Snack on chips,  diet sodas,  no sugar added cookies.   Dinner includes a salad every bother dya and lots of veggies.    5) DEXA scan ordered in August but she was never called   History Veronica Barron has a past medical history of Allergic rhinitis, cause unspecified, CAD (coronary artery disease), Diverticulosis of colon, Hyperlipidemia, Hypertension, Ischemic cardiomyopathy, Leiomyoma of uterus, unspecified, and Unspecified hypothyroidism.   She has a  past surgical history that includes Tubal ligation; Cholecystectomy; Abdominal hysterectomy; left heart catheterization with coronary angiogram (N/A, 03/16/2013); percutaneous coronary stent intervention (pci-s) (03/16/2013); left heart catheterization with coronary angiogram  (N/A, 05/23/2014); laparoscopic appendectomy (N/A, 04/26/2019); and Cataract extraction w/ intraocular lens implant (Bilateral, 01/07/2021).   Her family history includes Breast cancer in her maternal aunt; Coronary artery disease in her father; Dementia in her mother; Hyperlipidemia in her father and mother; Hypertension in her father and mother; Uterine cancer (age of onset: 50) in her sister.She reports that she has never smoked. She has never used smokeless tobacco. She reports current alcohol use. She reports that she does not use drugs.  Outpatient Medications Prior to Visit  Medication Sig Dispense Refill   ALFALFA PO Take 1 tablet by mouth daily.     aspirin EC 81 MG tablet Take 81 mg by mouth daily.     carvedilol (COREG) 6.25 MG tablet TAKE 1 TABLET BY MOUTH TWICE DAILY WITH MEALS 180 tablet 3   Evolocumab (REPATHA SURECLICK) 295 MG/ML SOAJ Inject '140mg'$  under the skin every 14 days 6 mL 3   levothyroxine (SYNTHROID) 88 MCG tablet TAKE ONE TABLET BY MOUTH DAILY BEFORE BREAKFAST 60 tablet 0   lisinopril (ZESTRIL) 5 MG tablet Take 1 tablet (5 mg total) by mouth 2 (two) times daily. 60 tablet 11   loratadine (CLARITIN) 10 MG tablet Take 10 mg by mouth daily.     multivitamin-lutein (OCUVITE-LUTEIN) CAPS capsule Take 1 capsule by mouth daily.     sertraline (ZOLOFT) 50 MG tablet TAKE 1 TABLET BY MOUTH DAILY GENERIC EQUIVALENT FOR ZOLOFT 90 tablet 1   vitamin B-12 (CYANOCOBALAMIN) 500 MCG tablet Take 500 mcg by mouth daily.     vitamin C (ASCORBIC ACID) 500 MG tablet Take 500 mg by mouth daily.     No facility-administered medications prior to visit.    Review of Systems  Patient denies headache, fevers, malaise, unintentional weight loss, skin rash, eye pain, sinus congestion and sinus pain, sore throat, dysphagia,  hemoptysis , cough, dyspnea, wheezing, chest pain, palpitations, orthopnea, edema, abdominal pain, nausea, melena, diarrhea, constipation, flank pain, dysuria, hematuria,  urinary  Frequency, nocturia, numbness, tingling, seizures,  Focal weakness, Loss of consciousness,  Tremor, insomnia, depression, anxiety, and suicidal ideation.    Objective:  BP 126/74   Pulse 68   Temp 98.1 F (36.7 C) (Oral)   Ht '5\' 3"'$  (1.6 m)   Wt 146 lb 3.2 oz (66.3 kg)   SpO2 94%   BMI 25.90 kg/m   Physical Exam   General appearance: alert, cooperative and appears stated age Ears: normal TM's and external ear canals both ears Throat: lips, mucosa, and tongue normal; teeth and gums normal Neck: no adenopathy, no carotid bruit, supple, symmetrical, trachea midline and thyroid not enlarged, symmetric, no tenderness/mass/nodules Back: symmetric, no curvature. ROM normal. No CVA tenderness. Lungs: clear to auscultation bilaterally Heart: regular rate and rhythm, S1, S2 normal, no murmur, click, rub or gallop Abdomen: soft, non-tender; bowel sounds normal; no masses,  no organomegaly Pulses: 2+ and symmetric Skin: Skin color, texture, turgor normal. No rashes or lesions Lymph nodes: Cervical, supraclavicular, and axillary nodes normal.      Assessment & Plan:   Problem List Items Addressed This Visit     White coat syndrome with diagnosis of hypertension - Primary    she reports compliance with medication regimen and home readings < 130/80  on  a consistent basis.  Neuropathy    Managed with nonpharmacologic treaments       Hypothyroidism   Relevant Orders   TSH   Encounter for preventive health examination    age appropriate education and counseling updated, referrals for preventative services and immunizations addressed, dietary and smoking counseling addressed, most recent labs reviewed.  I have personally reviewed and have noted:   1) the patient's medical and social history 2) The pt's use of alcohol, tobacco, and illicit drugs 3) The patient's current medications and supplements 4) Functional ability including ADL's, fall risk, home safety risk,  hearing and visual impairment 5) Diet and physical activities 6) Evidence for depression or mood disorder 7) The patient's height, weight, and BMI have been recorded in the chart  I have made referrals, and provided counseling and education based on review of the above       Aortic atherosclerosis ([place])    Aortic atherosclerosis:  Reviewed findings of prior CT scan today..  Patient  Is intolerant of statins,  zetia and fenofibrate   due to myalgias,  but tolerating  Repatha       Other Visit Diagnoses     Need for immunization against influenza       Relevant Orders   Flu Vaccine QUAD High Dose(Fluad) (Completed)       I am having Hildur A. Dejoseph maintain her ascorbic acid, aspirin EC, multivitamin-lutein, ALFALFA PO, vitamin B-12, loratadine, lisinopril, sertraline, carvedilol, levothyroxine, and Repatha SureClick.    I provided 40 minutes of  face-to-face time during this encounter reviewing patient's current problems and past surgeries,  recent labs and imaging studies, providing counseling on the above mentioned problems , and coordination  of care .   Follow-up: No follow-ups on file.   [name], MD

## 2022-05-22 ENCOUNTER — Ambulatory Visit
Admission: RE | Admit: 2022-05-22 | Discharge: 2022-05-22 | Disposition: A | Discharge status: Home / Self Care | Payer: Medicare Other | Source: Ambulatory Visit | Attending: Internal Medicine | Admitting: Internal Medicine

## 2022-05-22 DIAGNOSIS — Z1231 Encounter for screening mammogram for malignant neoplasm of breast: Secondary | ICD-10-CM | POA: Diagnosis not present

## 2022-05-22 LAB — MICROALBUMIN / CREATININE URINE RATIO
Creatinine,U: 99.3 mg/dL
Microalb Creat Ratio: 0.7 mg/g (ref 0.0–30.0)
Microalb, Ur: <0.7 mg/dL (ref 0.0–1.9)

## 2022-05-22 LAB — TSH: TSH: 0.91 u[IU]/mL (ref 0.35–5.50)

## 2022-05-23 ENCOUNTER — Telehealth: Payer: Self-pay

## 2022-05-23 NOTE — Telephone Encounter (Signed)
-----   Message from [name], MD sent at 05/23/2022  8:47 AM EST ----- Your electrolytes,   liver and kidney function are all normal.  Please continue your current medications and  plan to repeat  fasting labs in 6 months.    Regards,   Deborra Medina, MD

## 2022-05-23 NOTE — Telephone Encounter (Signed)
LMTCB in regards to lab results.  

## 2022-05-26 NOTE — Telephone Encounter (Signed)
noted 

## 2022-05-26 NOTE — Telephone Encounter (Signed)
Patient returned office phone for labs. Note was read from Dr Derrel Nip.

## 2022-05-30 ENCOUNTER — Telehealth: Payer: Self-pay | Admitting: Internal Medicine

## 2022-05-30 NOTE — Telephone Encounter (Signed)
Copied from [place] [id number]. Topic: Medicare AWV >> May 30, 2022 11:49 AM Devoria Glassing wrote: Reason for CRM: Left message for patient to schedule Annual Wellness Visit.  Please schedule with Nurse Health Advisor Denisa O'Brien-Blaney, LPN at [facility]. This appt can be telephone or office visit.  Please call [phone] ask for [facility]

## 2022-06-09 ENCOUNTER — Telehealth: Payer: Self-pay | Admitting: Internal Medicine

## 2022-06-09 NOTE — Telephone Encounter (Signed)
Copied from [place] [id number]. Topic: Medicare AWV >> Jun 09, 2022 10:02 AM Devoria Glassing wrote: Reason for CRM: Left message for patient to schedule Annual Wellness Visit.  Please schedule with Nurse Health Advisor Denisa O'Brien-Blaney, LPN at [facility]. This appt can be telephone or office visit.  Please call [phone] ask for [facility]

## 2022-06-18 ENCOUNTER — Ambulatory Visit (INDEPENDENT_AMBULATORY_CARE_PROVIDER_SITE_OTHER): Payer: Medicare Other

## 2022-06-18 VITALS — Ht 63.0 in | Wt 146.0 lb

## 2022-06-18 DIAGNOSIS — Z0000 Encounter for general adult medical examination without abnormal findings: Secondary | ICD-10-CM | POA: Diagnosis not present

## 2022-06-18 NOTE — Progress Notes (Addendum)
Subjective:   Veronica Barron is a 81 y.o. female who presents for Medicare Annual (Subsequent) preventive examination.  Review of Systems    No ROS.  Medicare Wellness Virtual Visit.  Visual/audio telehealth visit, UTA vital signs.   See social history for additional risk factors.   Cardiac Risk Factors include: advanced age (>21mn, >>37women)     Objective:    Today's Vitals   06/18/22 1059  Weight: 146 lb (66.2 kg)  Height: _0  (1.6 m)   Body mass index is 25.86 kg/m.     06/18/2022   11:02 AM 05/31/2021   12:36 PM 05/30/2020   12:40 PM 05/30/2019   10:08 AM 04/27/2019    1:03 AM 04/26/2019    3:37 PM 05/26/2018   10:18 AM  Advanced Directives  Does Patient Have a Medical Advance Directive? Yes Yes Yes Yes  No Yes  Type of AParamedicof ADutch FlatLiving will [place]Living will [place]Living will Living will;Healthcare Power of [place] will  Does patient want to make changes to medical advance directive? No - Patient declined No - Patient declined No - Patient declined No - Patient declined   No - Patient declined  Copy of HTrappein Chart? Yes - validated most recent copy scanned in chart (See row information) Yes - validated most recent copy scanned in chart (See row information) Yes - validated most recent copy scanned in chart (See row information) Yes - validated most recent copy scanned in chart (See row information)   Yes - validated most recent copy scanned in chart (See row information)  Would patient like information on creating a medical advance directive?     No - Patient declined      Current Medications (verified) Outpatient Encounter Medications as of 06/18/2022  Medication Sig   ALFALFA PO Take 1 tablet by mouth daily.   aspirin EC 81 MG tablet Take 81 mg by mouth daily.   carvedilol (COREG) 6.25 MG tablet TAKE 1 TABLET BY  MOUTH TWICE DAILY WITH MEALS   Evolocumab (REPATHA SURECLICK) 1428MG/ML SOAJ Inject 1466munder the skin every 14 days   levothyroxine (SYNTHROID) 88 MCG tablet TAKE ONE TABLET BY MOUTH DAILY BEFORE BREAKFAST   lisinopril (ZESTRIL) 5 MG tablet Take 1 tablet (5 mg total) by mouth 2 (two) times daily.   loratadine (CLARITIN) 10 MG tablet Take 10 mg by mouth daily.   multivitamin-lutein (OCUVITE-LUTEIN) CAPS capsule Take 1 capsule by mouth daily.   sertraline (ZOLOFT) 50 MG tablet TAKE 1 TABLET BY MOUTH DAILY GENERIC EQUIVALENT FOR ZOLOFT   vitamin B-12 (CYANOCOBALAMIN) 500 MCG tablet Take 500 mcg by mouth daily.   vitamin C (ASCORBIC ACID) 500 MG tablet Take 500 mg by mouth daily.   No facility-administered encounter medications on file as of 06/18/2022.    Allergies (verified) Diphenhydramine hcl, Fenofibrate, Statins, and Zetia [ezetimibe]   History: Past Medical History:  Diagnosis Date   Allergic rhinitis, cause unspecified    CAD (coronary artery disease)    a. 03/2013 Abnl MV, EF 29%;  b. 03/2013 Cath/PCI: >M nl, LAD 99p (2.5x28 Promus Prem DES), LCX 100 after large OM1, RCA dominant, 20p, EF 30%.   Diverticulosis of colon    Hyperlipidemia    Hypertension    Ischemic cardiomyopathy    a. 03/2013 EF 30% by LV gram.;  b. Echo (12/14): Mod LVH with severe basal  septal hypertrophy w/o LVOT obstr, EF 50-55%, normal wall motion, Gr 1 DD, MAC, mild MR, mild LAE, trivial eff.   Leiomyoma of uterus, unspecified    Unspecified hypothyroidism    Past Surgical History:  Procedure Laterality Date   ABDOMINAL HYSTERECTOMY     fibroid   at  age 52   CATARACT EXTRACTION W/ INTRAOCULAR LENS IMPLANT Bilateral 01/07/2021   Dr Lucita Ferrara   CHOLECYSTECTOMY     LAPAROSCOPIC APPENDECTOMY N/A 04/26/2019   Procedure: APPENDECTOMY LAPAROSCOPIC;  Surgeon: Jules Husbands, MD;  Location: [facility] ORS;  Service: General;  Laterality: N/A;   LEFT HEART CATHETERIZATION WITH CORONARY ANGIOGRAM N/A 03/16/2013    Procedure: LEFT HEART CATHETERIZATION WITH CORONARY ANGIOGRAM;  Surgeon: [name] [name], MD;  Location: [facility] CATH LAB;  Service: Cardiovascular;  Laterality: N/A;   LEFT HEART CATHETERIZATION WITH CORONARY ANGIOGRAM N/A 05/23/2014   Procedure: LEFT HEART CATHETERIZATION WITH CORONARY ANGIOGRAM;  Surgeon: [name] [name], MD;  Location: [facility] CATH LAB;  Service: Cardiovascular;  Laterality: N/A;   PERCUTANEOUS CORONARY STENT INTERVENTION (PCI-S)  03/16/2013   Procedure: PERCUTANEOUS CORONARY STENT INTERVENTION (PCI-S);  Surgeon: [name] [name], MD;  Location: [facility] CATH LAB;  Service: Cardiovascular;;   TUBAL LIGATION     Family History  Problem Relation Age of Onset   Dementia Mother    Hyperlipidemia Mother    Hypertension Mother    Coronary artery disease Father    Hypertension Father    Hyperlipidemia Father    Uterine cancer Sister 23   Breast cancer Maternal Aunt    Social History   Socioeconomic History   Marital status: Married    Spouse name: Not on file   Number of children: 3   Years of education: Not on file   Highest education level: Not on file  Occupational History    Employer: RETIRED  Tobacco Use   Smoking status: Never   Smokeless tobacco: Never  Vaping Use   Vaping Use: Never used  Substance and Sexual Activity   Alcohol use: Yes    Comment: wine   Drug use: No   Sexual activity: Not Currently    Birth control/protection: Post-menopausal  Other Topics Concern   Not on file  Social History Narrative   High school graduate, married in [place], 3 sons ages 1, 44, 71; 4 grandchildren, 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   Social Determinants of Health   Financial Resource Strain: Low Risk  (06/18/2022)   Overall Financial Resource Strain (CARDIA)    Difficulty of Paying Living Expenses: Not very hard  Food Insecurity: No Food Insecurity (06/18/2022)   Hunger Vital Sign    Worried About Running Out of Food in the Last Year: Never true    Ran Out of Food  in the Last Year: Never true  Transportation Needs: No Transportation Needs (06/18/2022)   PRAPARE - Hydrologist (Medical): No    Lack of Transportation (Non-Medical): No  Physical Activity: Insufficiently Active (06/18/2022)   Exercise Vital Sign    Days of Exercise per Week: 2 days    Minutes of Exercise per Session: 30 min  Stress: No Stress Concern Present (06/18/2022)   Cole    Feeling of Stress : Not at all  Social Connections: Unknown (06/18/2022)   Social Connection and Isolation Panel [NHANES]    Frequency of Communication with Friends and Family: Not on file    Frequency of  Social Gatherings with Friends and Family: Not on file    Attends Religious Services: Not on file    Active Member of Clubs or Organizations: Not on file    Attends Archivist Meetings: Not on file    Marital Status: Married    Tobacco Counseling Counseling given: Not Answered   Clinical Intake:  Pre-visit preparation completed: Yes        Diabetes: No  How often do you need to have someone help you when you read instructions, pamphlets, or other written materials from your doctor or pharmacy?: 1 - Never    Interpreter Needed?: No      Activities of Daily Living    06/18/2022   11:03 AM  In your present state of health, do you have any difficulty performing the following activities:  Hearing? 0  Vision? 0  Difficulty concentrating or making decisions? 0  Walking or climbing stairs? 0  Dressing or bathing? 0  Doing errands, shopping? 0  Preparing Food and eating ? N  Using the Toilet? N  In the past six months, have you accidently leaked urine? N  Do you have problems with loss of bowel control? N  Managing your Medications? N  Managing your Finances? N  Housekeeping or managing your Housekeeping? N    Patient Care Team: [name], MD as PCP - General (Internal  Medicine) [name], [name], MD as PCP - Cardiology (Cardiology) Judeth Horn, MD (General Surgery) Clent Jacks, MD (Ophthalmology)  Indicate any recent Medical Services you may have received from other than [facility] providers in the past year (date may be approximate).     Assessment:   This is a routine wellness examination for Malayshia.  I connected with  [name] on 06/18/22 by a audio enabled telemedicine application and verified that I am speaking with the correct person using two identifiers.  Patient Location: Home  Provider Location: Office/Clinic  I discussed the limitations of evaluation and management by telemedicine. The patient expressed understanding and agreed to proceed.   Hearing/Vision screen Hearing Screening - Comments::  Patient is able to hear conversational tones without difficulty. No issues reported. Vision Screening - Comments:: Followed by Dr. Marica Otter  Wears corrective lenses when reading Cataract extracted, bilateral  Dietary issues and exercise activities discussed: Current Exercise Habits: Home exercise routine, Intensity: Mild Healthy diet Good water intake   Goals Addressed             This Visit's Progress    Maintain Healthy Lifestyle       Healthy diet. Exercise.       Depression Screen    06/18/2022   11:00 AM 05/21/2022    3:19 PM 02/18/2022    2:05 PM 05/31/2021   12:42 PM 04/23/2021    1:53 PM 05/30/2020   12:39 PM 05/30/2019   10:09 AM  PHQ 2/9 Scores  PHQ - 2 Score 0 0 0 0 0 0 0    Fall Risk    06/18/2022   11:03 AM 05/21/2022    3:19 PM 02/18/2022    2:04 PM 05/31/2021   12:41 PM 04/23/2021    1:53 PM  [place] in the past year? 0 0 0 0 0  Number falls in past yr: 0  0 0   Injury with Fall? 0  0    Risk for fall due to : No Fall Risks No Fall Risks No Fall Risks    Follow  up Falls evaluation completed;Falls prevention discussed Falls evaluation completed Falls evaluation completed Falls evaluation  completed Falls evaluation completed    FALL RISK PREVENTION PERTAINING TO THE HOME: Home free of loose throw rugs in walkways, pet beds, electrical cords, etc? Yes  Adequate lighting in your home to reduce risk of falls? Yes   ASSISTIVE DEVICES UTILIZED TO PREVENT FALLS: Life alert? No  Use of a cane, walker or w/c? No  Grab bars in the bathroom? No  Shower chair or bench in shower? No  Elevated toilet seat or a handicapped toilet? No   TIMED UP AND GO: Was the test performed? No .   Cognitive Function:    05/19/2016   11:24 AM  MMSE - Mini Mental State Exam  Orientation to time 5  Orientation to Place 5  Registration 3  Attention/ Calculation 5  Recall 3  Language- name 2 objects 2  Language- repeat 1  Language- follow 3 step command 3  Language- read & follow direction 1  Write a sentence 1  Copy design 1  Total score 30        06/18/2022   11:15 AM 05/31/2021   12:43 PM 05/26/2018   10:20 AM 05/25/2017   10:30 AM  6CIT Screen  What Year? 0 points 0 points 0 points 0 points  What month? 0 points 0 points 0 points 0 points  What time? 0 points 0 points 0 points 0 points  Count back from 20 0 points 0 points 0 points 0 points  Months in reverse 0 points 0 points 0 points 0 points  Repeat phrase 0 points 0 points 0 points 0 points  Total Score 0 points 0 points 0 points 0 points    Immunizations Immunization History  Administered Date(s) Administered   Fluad Quad(high Dose 65+) 06/22/2020, 04/23/2021, 05/21/2022   Influenza Whole 07/14/2007, 05/15/2009, 06/13/2010   Influenza, High Dose Seasonal PF 04/29/2017, 05/26/2018   Influenza,inj,Quad PF,6+ Mos 03/09/2013, 04/04/2014, 04/24/2015, 04/25/2016   Influenza-Unspecified 05/19/2011   PFIZER(Purple Top)SARS-COV-2 Vaccination 08/04/2019, 08/25/2019   Pneumococcal Conjugate-13 03/15/2014   Pneumococcal Polysaccharide-23 05/25/2017   Tetanus 03/09/2013   Zoster, Live 11/01/2007   TDAP status: Due, Education  has been provided regarding the importance of this vaccine. Advised may receive this vaccine at local pharmacy or Health Dept. Aware to provide a copy of the vaccination record if obtained from local pharmacy or Health Dept. Verbalized acceptance and understanding.  Covid-19 vaccine status: Completed vaccines x2.  Screening Tests Health Maintenance  Topic Date Due   DTaP/Tdap/Td (1 - Tdap) 03/10/2013   COVID-19 Vaccine (3 - Pfizer risk series) 07/04/2022 (Originally 09/22/2019)   OPHTHALMOLOGY EXAM  07/15/2022 (Originally 06/19/1951)   Zoster Vaccines- Shingrix (1 of 2) 08/21/2022 (Originally 06/20/1960)   FOOT EXAM  12/24/2022 (Originally 04/23/2022)   HEMOGLOBIN A1C  08/21/2022   Diabetic kidney evaluation - GFR measurement  02/19/2023   Diabetic kidney evaluation - Urine ACR  05/22/2023   Medicare Annual Wellness (AWV)  06/19/2023   MAMMOGRAM  05/22/2024   Pneumonia Vaccine 40+ Years old  Completed   INFLUENZA VACCINE  Completed   DEXA SCAN  Completed   HPV VACCINES  Aged Out   Health Maintenance Health Maintenance Due  Topic Date Due   DTaP/Tdap/Td (1 - Tdap) 03/10/2013   Lung Cancer Screening: (Low Dose CT Chest recommended if Age 34-80 years, 30 pack-year currently smoking OR have quit w/in 15years.) does not qualify.   Hepatitis C Screening: does not qualify.  Vision Screening: Recommended annual ophthalmology exams for early detection of glaucoma and other disorders of the eye. Next appointment scheduled 06/19/22. Deferred for completion.   Dental Screening: Recommended annual dental exams for proper oral hygiene.  Community Resource Referral / Chronic Care Management: CRR required this visit?  No   CCM required this visit?  No      Plan:     I have personally reviewed and noted the following in the patient's chart:   Medical and social history Use of alcohol, tobacco or illicit drugs  Current medications and supplements including opioid prescriptions. Patient is  not currently taking opioid prescriptions. Functional ability and status Nutritional status Physical activity Advanced directives List of other physicians Hospitalizations, surgeries, and ER visits in previous 12 months Vitals Screenings to include cognitive, depression, and falls Referrals and appointments  In addition, I have reviewed and discussed with patient certain preventive protocols, quality metrics, and best practice recommendations. A written personalized care plan for preventive services as well as general preventive health recommendations were provided to patient.     Rafael Gonzalez, LPN   14/12/477    I have reviewed the above information and agree with above.   Deborra Medina, MD

## 2022-06-18 NOTE — Patient Instructions (Addendum)
Ms. Veronica Barron , Thank you for taking time to come for your Medicare Wellness Visit. I appreciate your ongoing commitment to your health goals. Please review the following plan we discussed and let me know if I can assist you in the future.   These are the goals we discussed:  Goals      Maintain Healthy Lifestyle     Healthy diet. Exercise.        This is a list of the screening recommended for you and due dates:  Health Maintenance  Topic Date Due   DTaP/Tdap/Td vaccine (1 - Tdap) 03/10/2013   COVID-19 Vaccine (3 - Pfizer risk series) 07/04/2022*   Eye exam for diabetics  07/15/2022*   Zoster (Shingles) Vaccine (1 of 2) 08/21/2022*   Complete foot exam   12/24/2022*   Hemoglobin A1C  08/21/2022   Yearly kidney function blood test for diabetes  02/19/2023   Yearly kidney health urinalysis for diabetes  05/22/2023   Medicare Annual Wellness Visit  06/19/2023   Mammogram  05/22/2024   Pneumonia Vaccine  Completed   Flu Shot  Completed   DEXA scan (bone density measurement)  Completed   HPV Vaccine  Aged Out  *Topic was postponed. The date shown is not the original due date.    Advanced directives: on file  Conditions/risks identified: none new  Next appointment: Follow up in one year for your annual wellness visit    Preventive Care 65 Years and Older, Female Preventive care refers to lifestyle choices and visits with your health care provider that can promote health and wellness. What does preventive care include? A yearly physical exam. This is also called an annual well check. Dental exams once or twice a year. Routine eye exams. Ask your health care provider how often you should have your eyes checked. Personal lifestyle choices, including: Daily care of your teeth and gums. Regular physical activity. Eating a healthy diet. Avoiding tobacco and drug use. Limiting alcohol use. Practicing safe sex. Taking low-dose aspirin every day. Taking vitamin and mineral  supplements as recommended by your health care provider. What happens during an annual well check? The services and screenings done by your health care provider during your annual well check will depend on your age, overall health, lifestyle risk factors, and family history of disease. Counseling  Your health care provider may ask you questions about your: Alcohol use. Tobacco use. Drug use. Emotional well-being. Home and relationship well-being. Sexual activity. Eating habits. History of falls. Memory and ability to understand (cognition). Work and work Statistician. Reproductive health. Screening  You may have the following tests or measurements: Height, weight, and BMI. Blood pressure. Lipid and cholesterol levels. These may be checked every 5 years, or more frequently if you are over 69 years old. Skin check. Lung cancer screening. You may have this screening every year starting at age 27 if you have a 30-pack-year history of smoking and currently smoke or have quit within the past 15 years. Fecal occult blood test (FOBT) of the stool. You may have this test every year starting at age 44. Flexible sigmoidoscopy or colonoscopy. You may have a sigmoidoscopy every 5 years or a colonoscopy every 10 years starting at age 51. Hepatitis C blood test. Hepatitis B blood test. Sexually transmitted disease (STD) testing. Diabetes screening. This is done by checking your blood sugar (glucose) after you have not eaten for a while (fasting). You may have this done every 1-3 years. Bone density scan. This is done  to screen for osteoporosis. You may have this done starting at age 74. Mammogram. This may be done every 1-2 years. Talk to your health care provider about how often you should have regular mammograms. Talk with your health care provider about your test results, treatment options, and if necessary, the need for more tests. Vaccines  Your health care provider may recommend certain  vaccines, such as: Influenza vaccine. This is recommended every year. Tetanus, diphtheria, and acellular pertussis (Tdap, Td) vaccine. You may need a Td booster every 10 years. Zoster vaccine. You may need this after age 77. Pneumococcal 13-valent conjugate (PCV13) vaccine. One dose is recommended after age 58. Pneumococcal polysaccharide (PPSV23) vaccine. One dose is recommended after age 36. Talk to your health care provider about which screenings and vaccines you need and how often you need them. This information is not intended to replace advice given to you by your health care provider. Make sure you discuss any questions you have with your health care provider. Document Released: 07/27/2015 Document Revised: 03/19/2016 Document Reviewed: 05/01/2015 Elsevier Interactive Patient Education  2017 [place] Prevention in the Home Falls can cause injuries. They can happen to people of all ages. There are many things you can do to make your home safe and to help prevent falls. What can I do on the outside of my home? Regularly fix the edges of walkways and driveways and fix any cracks. Remove anything that might make you trip as you walk through a door, such as a raised step or threshold. Trim any bushes or trees on the path to your home. Use bright outdoor lighting. Clear any walking paths of anything that might make someone trip, such as rocks or tools. Regularly check to see if handrails are loose or broken. Make sure that both sides of any steps have handrails. Any raised decks and porches should have guardrails on the edges. Have any leaves, snow, or ice cleared regularly. Use sand or salt on walking paths during winter. Clean up any spills in your garage right away. This includes oil or grease spills. What can I do in the bathroom? Use night lights. Install grab bars by the toilet and in the tub and shower. Do not use towel bars as grab bars. Use non-skid mats or decals in  the tub or shower. If you need to sit down in the shower, use a plastic, non-slip stool. Keep the floor dry. Clean up any water that spills on the floor as soon as it happens. Remove soap buildup in the tub or shower regularly. Attach bath mats securely with double-sided non-slip rug tape. Do not have throw rugs and other things on the floor that can make you trip. What can I do in the bedroom? Use night lights. Make sure that you have a light by your bed that is easy to reach. Do not use any sheets or blankets that are too big for your bed. They should not hang down onto the floor. Have a firm chair that has side arms. You can use this for support while you get dressed. Do not have throw rugs and other things on the floor that can make you trip. What can I do in the kitchen? Clean up any spills right away. Avoid walking on wet floors. Keep items that you use a lot in easy-to-reach places. If you need to reach something above you, use a strong step stool that has a grab bar. Keep electrical cords out of the  way. Do not use floor polish or wax that makes floors slippery. If you must use wax, use non-skid floor wax. Do not have throw rugs and other things on the floor that can make you trip. What can I do with my stairs? Do not leave any items on the stairs. Make sure that there are handrails on both sides of the stairs and use them. Fix handrails that are broken or loose. Make sure that handrails are as long as the stairways. Check any carpeting to make sure that it is firmly attached to the stairs. Fix any carpet that is loose or worn. Avoid having throw rugs at the top or bottom of the stairs. If you do have throw rugs, attach them to the floor with carpet tape. Make sure that you have a light switch at the top of the stairs and the bottom of the stairs. If you do not have them, ask someone to add them for you. What else can I do to help prevent falls? Wear shoes that: Do not have high  heels. Have rubber bottoms. Are comfortable and fit you well. Are closed at the toe. Do not wear sandals. If you use a stepladder: Make sure that it is fully opened. Do not climb a closed stepladder. Make sure that both sides of the stepladder are locked into place. Ask someone to hold it for you, if possible. Clearly mark and make sure that you can see: Any grab bars or handrails. First and last steps. Where the edge of each step is. Use tools that help you move around (mobility aids) if they are needed. These include: Canes. Walkers. Scooters. Crutches. Turn on the lights when you go into a dark area. Replace any light bulbs as soon as they burn out. Set up your furniture so you have a clear path. Avoid moving your furniture around. If any of your floors are uneven, fix them. If there are any pets around you, be aware of where they are. Review your medicines with your doctor. Some medicines can make you feel dizzy. This can increase your chance of falling. Ask your doctor what other things that you can do to help prevent falls. This information is not intended to replace advice given to you by your health care provider. Make sure you discuss any questions you have with your health care provider. Document Released: 04/26/2009 Document Revised: 12/06/2015 Document Reviewed: 08/04/2014 Elsevier Interactive Patient Education  2017 [employer].

## 2022-06-24 ENCOUNTER — Telehealth: Payer: Self-pay | Admitting: Pharmacist Clinician (PhC)/ Clinical Pharmacy Specialist

## 2022-06-24 NOTE — Telephone Encounter (Signed)
Repatha PA approved to Nov 2024.   Updated [employer] today as well.  Patient aware.   She will be getting new insurance plan in January.  Will get 3 month supply of Repatha this week and reach out in late January to give [place] the new insurance information.

## 2022-06-24 NOTE — Telephone Encounter (Signed)
Patient called and would like to talk with pharmacist in regards to her medication Repatha. Please call back to discuss

## 2022-07-10 ENCOUNTER — Other Ambulatory Visit: Payer: Self-pay | Admitting: Internal Medicine

## 2022-08-04 ENCOUNTER — Other Ambulatory Visit (HOSPITAL_COMMUNITY): Payer: Self-pay

## 2022-08-05 ENCOUNTER — Telehealth: Payer: Self-pay

## 2022-08-05 ENCOUNTER — Other Ambulatory Visit (HOSPITAL_COMMUNITY): Payer: Self-pay

## 2022-08-05 DIAGNOSIS — I700 Atherosclerosis of aorta: Secondary | ICD-10-CM

## 2022-08-05 DIAGNOSIS — M791 Myalgia, unspecified site: Secondary | ICD-10-CM

## 2022-08-05 DIAGNOSIS — I25118 Atherosclerotic heart disease of native coronary artery with other forms of angina pectoris: Secondary | ICD-10-CM

## 2022-08-05 NOTE — Telephone Encounter (Signed)
Pharmacy Patient Advocate Encounter  Prior Authorization for REPATHA 140 MG/ML INJ has been approved.    Effective dates: 08/05/22 through 08/06/23   Received notification from [facility] that prior authorization for REPATHA 140 MG/ML INJ is needed.    PA submitted on 08/05/22 Key [id number] Status is pending  Karie Soda, [place] Patient Advocate Specialist Direct Number: [phone] Fax: [phone]

## 2022-08-12 MED ORDER — REPATHA SURECLICK 140 MG/ML ~~LOC~~ SOAJ: SUBCUTANEOUS | 3 refills | Status: DC

## 2022-08-12 NOTE — Addendum Note (Signed)
Addended by: Rockne Menghini on: 08/12/2022 12:54 PM   Modules accepted: Orders

## 2022-08-18 ENCOUNTER — Other Ambulatory Visit: Payer: Self-pay | Admitting: Internal Medicine

## 2022-08-28 ENCOUNTER — Other Ambulatory Visit (INDEPENDENT_AMBULATORY_CARE_PROVIDER_SITE_OTHER): Payer: Medicare Other

## 2022-08-28 DIAGNOSIS — R7303 Prediabetes: Secondary | ICD-10-CM | POA: Diagnosis not present

## 2022-08-28 DIAGNOSIS — E781 Pure hyperglyceridemia: Secondary | ICD-10-CM

## 2022-08-28 LAB — COMPREHENSIVE METABOLIC PANEL
ALT: 15 U/L (ref 0–35)
AST: 22 U/L (ref 0–37)
Albumin: 4.1 g/dL (ref 3.5–5.2)
Alkaline Phosphatase: 52 U/L (ref 39–117)
BUN: 20 mg/dL (ref 6–23)
CO2: 27 mEq/L (ref 19–32)
Calcium: 9.3 mg/dL (ref 8.4–10.5)
Chloride: 105 mEq/L (ref 96–112)
Creatinine, Ser: 0.85 mg/dL (ref 0.40–1.20)
GFR: 63.91 mL/min (ref >60.00)
Glucose, Bld: 113 mg/dL — ABNORMAL HIGH (ref 70–99)
Potassium: 4.4 mEq/L (ref 3.5–5.1)
Sodium: 141 mEq/L (ref 135–145)
Total Bilirubin: 0.5 mg/dL (ref 0.2–1.2)
Total Protein: 6.5 g/dL (ref 6.0–8.3)

## 2022-08-28 LAB — HEMOGLOBIN A1C: Hgb A1c MFr Bld: 6.3 % (ref 4.6–6.5)

## 2022-08-29 LAB — LIPID PANEL W/REFLEX DIRECT LDL
Cholesterol: 124 mg/dL (ref <200)
HDL: 62 mg/dL (ref >=50)
LDL Cholesterol (Calc): 37 mg/dL (calc)
Non-HDL Cholesterol (Calc): 62 mg/dL (calc) (ref <130)
Total CHOL/HDL Ratio: 2 (calc) (ref <5.0)
Triglycerides: 177 mg/dL — ABNORMAL HIGH (ref <150)

## 2022-09-01 ENCOUNTER — Other Ambulatory Visit: Payer: Self-pay

## 2022-09-01 MED ORDER — LISINOPRIL 5 MG PO TABS: 5.0000 mg | ORAL_TABLET | Freq: Two times a day (BID) | ORAL | 2 refills | Status: DC

## 2022-09-08 ENCOUNTER — Telehealth: Payer: Self-pay | Admitting: Cardiology

## 2022-09-08 MED ORDER — LISINOPRIL 5 MG PO TABS: 5.0000 mg | ORAL_TABLET | Freq: Two times a day (BID) | ORAL | 3 refills | Status: DC

## 2022-09-08 NOTE — Telephone Encounter (Signed)
*  STAT* If patient is at the pharmacy, call can be transferred to refill team.   1. Which medications need to be refilled? (please list name of each medication and dose if known)  lisinopril (ZESTRIL) 5 MG tablet  2. Which pharmacy/location (including street and city if local pharmacy) is medication to be sent to? [place], [place]  3. Do they need a 30 day or 90 day supply?  30 day supply  Patient states she is completely out of medication.

## 2022-09-18 ENCOUNTER — Telehealth: Payer: Self-pay | Admitting: Internal Medicine

## 2022-09-22 ENCOUNTER — Other Ambulatory Visit (HOSPITAL_COMMUNITY): Payer: Self-pay

## 2022-09-22 ENCOUNTER — Telehealth: Payer: Self-pay | Admitting: Cardiology

## 2022-09-22 NOTE — Telephone Encounter (Signed)
  PA is with active plan. Good till 08/06/23

## 2022-09-22 NOTE — Telephone Encounter (Signed)
Looks like she has new insurance.  Please update PA with [facility] plan

## 2022-09-22 NOTE — Telephone Encounter (Signed)
Pt c/o medication issue:  1. Name of Medication: Evolocumab (REPATHA SURECLICK) [id number] MG/ML SOAJ   2. How are you currently taking this medication (dosage and times per day)? As prescribed   3. Are you having a reaction (difficulty breathing--STAT)? No  4. What is your medication issue? Patient states pharmacy needs prior auth for this medication and would like a call back to discuss.

## 2022-09-23 NOTE — Progress Notes (Unsigned)
Veronica Barron Date of Birth: 11-Nov-1940 Medical Record [id number]  History of Present Illness: Mrs. Veronica Barron is seen today for followup CAD. She has a history of hypertension, hyperlipidemia, and hypothyroidism. In Sept 2014 she had stenting of the proximal LAD with DES.  The distal left circumflex was occluded with left to left collaterals and right-to-left collaterals. The right coronary was without significant disease. Ejection fraction was 30%.  Her other disease was treated medically. On followup Echo in Dec. 2014 EF had improved to 50-55%. In November 2015 she had a significant episode of chest pain. Repeat cardiac cath showed occlusion of the distal LCx and the stent in the LAD was patent.   She has a history of intolerance to multiple statins, Zetia and Lopid, and fenofibrates. She was previously unable afford a PCSK 9 inhibitor and was seen by Pharm D and it was authorized and she was taking it with help from patient assistance foundation.  She was admitted in October 2020 with acute appendicitis. She had appy without complications.   On follow up today she reports she is feeling very well. No chest pain or SOB. She is still quite active with her walking and gardening. Good energy. Unfortunately assistance from the foundation has run out so she is having to come off the Repatha due to cost.    Current Outpatient Medications on File Prior to Visit  Medication Sig Dispense Refill   ALFALFA PO Take 1 tablet by mouth daily.     aspirin EC 81 MG tablet Take 81 mg by mouth daily.     carvedilol (COREG) 6.25 MG tablet TAKE 1 TABLET BY MOUTH TWICE DAILY WITH MEALS 180 tablet 3   Evolocumab (REPATHA SURECLICK) XX123456 MG/ML SOAJ Inject '140mg'$  under the skin every 14 days 6 mL 3   levothyroxine (SYNTHROID) 88 MCG tablet TAKE 1 TABLET BY MOUTH DAILY BEFORE BREAKFAST 60 tablet 0   lisinopril (ZESTRIL) 5 MG tablet Take 1 tablet (5 mg total) by mouth 2 (two) times daily. 60 tablet 3   loratadine  (CLARITIN) 10 MG tablet Take 10 mg by mouth daily.     multivitamin-lutein (OCUVITE-LUTEIN) CAPS capsule Take 1 capsule by mouth daily.     sertraline (ZOLOFT) 50 MG tablet TAKE 1 TABLET BY MOUTH DAILY. GENERIC EQUIVALENT FOR ZOLOFT 90 tablet 1   vitamin B-12 (CYANOCOBALAMIN) 500 MCG tablet Take 500 mcg by mouth daily.     vitamin C (ASCORBIC ACID) 500 MG tablet Take 500 mg by mouth daily.     No current facility-administered medications on file prior to visit.    Allergies  Allergen Reactions   Diphenhydramine Hcl     REACTION: TACHYCARDIA   Fenofibrate Other (See Comments)    constipation   Statins     Myalgia    Zetia [Ezetimibe] Other (See Comments)    Myalgias     Past Medical History:  Diagnosis Date   Allergic rhinitis, cause unspecified    CAD (coronary artery disease)    a. 03/2013 Abnl MV, EF 29%;  b. 03/2013 Cath/PCI: >M nl, LAD 99p (2.5x28 Promus Prem DES), LCX 100 after large OM1, RCA dominant, 20p, EF 30%.   Diverticulosis of colon    Hyperlipidemia    Hypertension    Ischemic cardiomyopathy    a. 03/2013 EF 30% by LV gram.;  b. Echo (12/14): Mod LVH with severe basal septal hypertrophy w/o LVOT obstr, EF 50-55%, normal wall motion, Gr 1 DD, MAC, mild MR, mild LAE, trivial  eff.   Leiomyoma of uterus, unspecified    Unspecified hypothyroidism     Past Surgical History:  Procedure Laterality Date   ABDOMINAL HYSTERECTOMY     fibroid   at  age 45   CATARACT EXTRACTION W/ INTRAOCULAR LENS IMPLANT Bilateral 01/07/2021   Dr Lucita Ferrara   CHOLECYSTECTOMY     LAPAROSCOPIC APPENDECTOMY N/A 04/26/2019   Procedure: APPENDECTOMY LAPAROSCOPIC;  Surgeon: Jules Husbands, MD;  Location: [facility] ORS;  Service: General;  Laterality: N/A;   LEFT HEART CATHETERIZATION WITH CORONARY ANGIOGRAM N/A 03/16/2013   Procedure: LEFT HEART CATHETERIZATION WITH CORONARY ANGIOGRAM;  Surgeon: Eliyahu Bille M [name], MD;  Location: [facility] CATH LAB;  Service: Cardiovascular;  Laterality: N/A;   LEFT HEART  CATHETERIZATION WITH CORONARY ANGIOGRAM N/A 05/23/2014   Procedure: LEFT HEART CATHETERIZATION WITH CORONARY ANGIOGRAM;  Surgeon: Lilliauna Van M [name], MD;  Location: [facility] CATH LAB;  Service: Cardiovascular;  Laterality: N/A;   PERCUTANEOUS CORONARY STENT INTERVENTION (PCI-S)  03/16/2013   Procedure: PERCUTANEOUS CORONARY STENT INTERVENTION (PCI-S);  Surgeon: Trinten Boudoin M [name], MD;  Location: [facility] CATH LAB;  Service: Cardiovascular;;   TUBAL LIGATION      Social History   Tobacco Use  Smoking Status Never  Smokeless Tobacco Never    Social History   Substance and Sexual Activity  Alcohol Use Yes   Comment: wine    Family History  Problem Relation Age of Onset   Dementia Mother    Hyperlipidemia Mother    Hypertension Mother    Coronary artery disease Father    Hypertension Father    Hyperlipidemia Father    Uterine cancer Sister 6   Breast cancer Maternal Aunt     Review of Systems: As noted in history of present illness.  All other systems were reviewed and are negative.  Physical Exam: There were no vitals taken for this visit. GENERAL:  Well appearing WF in NAD HEENT:  PERRL, EOMI, sclera are clear. Oropharynx is clear. NECK:  No jugular venous distention, carotid upstroke brisk and symmetric, no bruits, no thyromegaly or adenopathy LUNGS:  Clear to auscultation bilaterally CHEST:  Unremarkable HEART:  RRR,  PMI not displaced or sustained,S1 and S2 within normal limits, no S3, no S4: no clicks, no rubs, no murmurs ABD:  Soft, nontender. BS +, no masses or bruits. No hepatomegaly, no splenomegaly EXT:  2 + pulses throughout, no edema, no cyanosis no clubbing SKIN:  Warm and dry.  No rashes NEURO:  Alert and oriented x 3. Cranial nerves II through XII intact. 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 Rate 66. Twave inversion laterally. No change.   I have personally reviewed and interpreted this study.  Assessment / Plan: 1. Coronary disease status post stenting of the proximal LAD  in September 2014 with drug-eluting stent. 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 Cardiac cath in November 2015 showed continued stent patency. She is asymptomatic.  Continue current therapy. On ASA, Coreg and ACEi.   2. Hyperlipidemia. History of intolerance to multiple statins, Zetia, fenofibrates, and Lopid. Excellent response to Repatha with marked reduction in LDL to 37. Tolerating well.   3. Hypertension- good control. Continue Rx.  4. Hypothyroidism. TSH normal.   Follow up in 6 months

## 2022-09-24 ENCOUNTER — Encounter: Payer: Self-pay | Admitting: Cardiology

## 2022-09-24 ENCOUNTER — Ambulatory Visit: Payer: Medicare Other | Attending: Cardiology | Admitting: Cardiology

## 2022-09-24 VITALS — BP 124/68 | HR 68 | Ht 63.0 in | Wt 150.0 lb

## 2022-09-24 DIAGNOSIS — I700 Atherosclerosis of aorta: Secondary | ICD-10-CM

## 2022-09-24 DIAGNOSIS — I10 Essential (primary) hypertension: Secondary | ICD-10-CM

## 2022-09-24 DIAGNOSIS — I25118 Atherosclerotic heart disease of native coronary artery with other forms of angina pectoris: Secondary | ICD-10-CM

## 2022-09-24 DIAGNOSIS — E7800 Pure hypercholesterolemia, unspecified: Secondary | ICD-10-CM | POA: Diagnosis not present

## 2022-09-24 MED ORDER — LISINOPRIL 5 MG PO TABS: 5.0000 mg | ORAL_TABLET | Freq: Two times a day (BID) | ORAL | 6 refills | Status: DC

## 2022-10-10 ENCOUNTER — Telehealth: Payer: Self-pay | Admitting: Cardiology

## 2022-10-10 ENCOUNTER — Other Ambulatory Visit: Payer: Self-pay | Admitting: Pharmacist Clinician (PhC)/ Clinical Pharmacy Specialist

## 2022-10-10 DIAGNOSIS — I700 Atherosclerosis of aorta: Secondary | ICD-10-CM

## 2022-10-10 DIAGNOSIS — M791 Myalgia, unspecified site: Secondary | ICD-10-CM

## 2022-10-10 DIAGNOSIS — I25118 Atherosclerotic heart disease of native coronary artery with other forms of angina pectoris: Secondary | ICD-10-CM

## 2022-10-10 MED ORDER — CARVEDILOL 6.25 MG PO TABS: ORAL_TABLET | ORAL | 3 refills | Status: DC

## 2022-10-10 MED ORDER — REPATHA SURECLICK 140 MG/ML ~~LOC~~ SOAJ: SUBCUTANEOUS | 3 refills | Status: DC

## 2022-10-10 MED ORDER — LISINOPRIL 5 MG PO TABS: 5.0000 mg | ORAL_TABLET | Freq: Two times a day (BID) | ORAL | 3 refills | Status: DC

## 2022-10-10 NOTE — Telephone Encounter (Signed)
Pt c/o medication issue:  1. Name of Medication: lisinopril (ZESTRIL) 5 MG tablet  carvedilol (COREG) 6.25 MG tablet   2. How are you currently taking this medication (dosage and times per day)?   3. Are you having a reaction (difficulty breathing--STAT)?   4. What is your medication issue? Patient called stating she needs these medication sent to [facility] [place], [place].  They were sent to the wrong pharmacy.

## 2022-10-10 NOTE — Telephone Encounter (Signed)
[employer] ID [id number]  Hypercholesterolemia grant ID [id number] BIN   [id number] PCN   [id number] GRP [id number]  Exp 05/25/2023

## 2022-10-10 NOTE — Telephone Encounter (Signed)
REFILL SENT 

## 2022-10-10 NOTE — Telephone Encounter (Signed)
Sent prescription to [facility] - also submitted [employer] grnat

## 2022-10-14 MED ORDER — LEVOTHYROXINE SODIUM 88 MCG PO TABS: 88.0000 ug | ORAL_TABLET | Freq: Every day | ORAL | 1 refills | Status: DC

## 2022-10-14 MED ORDER — SERTRALINE HCL 50 MG PO TABS: ORAL_TABLET | ORAL | 1 refills | Status: DC

## 2022-10-14 NOTE — Telephone Encounter (Signed)
Medication have been refilled.  

## 2022-10-14 NOTE — Telephone Encounter (Signed)
Pt need a refill on sertraline and levothyroxine sent through mail order

## 2022-10-14 NOTE — Addendum Note (Signed)
Addended by: Adair Laundry on: 10/14/2022 05:11 PM   Modules accepted: Orders

## 2022-10-23 ENCOUNTER — Telehealth: Payer: Self-pay

## 2022-10-23 MED ORDER — LEVOTHYROXINE SODIUM 88 MCG PO TABS: 88.0000 ug | ORAL_TABLET | Freq: Every day | ORAL | 1 refills | Status: DC

## 2022-10-23 MED ORDER — SERTRALINE HCL 50 MG PO TABS: ORAL_TABLET | ORAL | 1 refills | Status: DC

## 2022-10-23 NOTE — Telephone Encounter (Signed)
Pharmacy has been changed and medications have been sent to new pharmacy.

## 2022-10-23 NOTE — Telephone Encounter (Signed)
Patient states now that her insurance has changed to [employer], she needs to change her pharmacy from [place] to [facility], so she can get the 90-day supply instead of the 30-day supply.  [facility] phone number:  [phone] and the fax number is:  [phone].

## 2022-10-27 ENCOUNTER — Telehealth: Payer: Self-pay | Admitting: Cardiology

## 2022-10-27 NOTE — Telephone Encounter (Signed)
*  STAT* If patient is at the pharmacy, call can be transferred to refill team.   1. Which medications need to be refilled? (please list name of each medication and dose if known)  carvedilol (COREG) 6.25 MG tablet lisinopril (ZESTRIL) 5 MG tablet  2. Which pharmacy/location (including street and city if local pharmacy) is medication to be sent to? [facility] -  Phone#: [phone] Fax#: [phone]  3. Do they need a 30 day or 90 day supply?  90 day supply  Patient states she switched insurance and her medication needs [facility]. She would like to have her current prescriptions transferred.

## 2022-10-28 ENCOUNTER — Other Ambulatory Visit: Payer: Self-pay

## 2022-10-28 MED ORDER — CARVEDILOL 6.25 MG PO TABS: ORAL_TABLET | ORAL | 3 refills | Status: DC

## 2022-10-28 MED ORDER — LISINOPRIL 5 MG PO TABS: 5.0000 mg | ORAL_TABLET | Freq: Two times a day (BID) | ORAL | 3 refills | Status: DC

## 2022-10-28 NOTE — Telephone Encounter (Signed)
Medications has been refilled and sent to her pharmacy

## 2022-10-31 ENCOUNTER — Other Ambulatory Visit: Payer: Self-pay | Admitting: *Deleted

## 2023-02-05 ENCOUNTER — Ambulatory Visit: Payer: Medicare Other | Admitting: Cardiology

## 2023-04-28 ENCOUNTER — Other Ambulatory Visit: Payer: Self-pay | Admitting: Internal Medicine

## 2023-04-28 DIAGNOSIS — Z1231 Encounter for screening mammogram for malignant neoplasm of breast: Secondary | ICD-10-CM

## 2023-05-11 ENCOUNTER — Telehealth: Payer: Self-pay | Admitting: Internal Medicine

## 2023-05-11 ENCOUNTER — Other Ambulatory Visit: Payer: Self-pay | Admitting: Internal Medicine

## 2023-05-11 NOTE — Telephone Encounter (Signed)
Medication refilled

## 2023-05-11 NOTE — Telephone Encounter (Signed)
Prescription Request  05/11/2023  LOV: 05/21/2022  What is the name of the medication or equipment? levothyroxine and sertraline   Have you contacted your pharmacy to request a refill? No   [facility]   Patient notified that their request is being sent to the clinical staff for review and that they should receive a response within 2 business days.   Please advise at [place] [facility]

## 2023-05-27 ENCOUNTER — Ambulatory Visit
Admission: RE | Admit: 2023-05-27 | Discharge: 2023-05-27 | Disposition: A | Discharge status: Home / Self Care | Payer: Medicare Other | Source: Ambulatory Visit | Attending: Internal Medicine | Admitting: Internal Medicine

## 2023-05-27 DIAGNOSIS — Z1231 Encounter for screening mammogram for malignant neoplasm of breast: Secondary | ICD-10-CM

## 2023-06-05 ENCOUNTER — Ambulatory Visit (INDEPENDENT_AMBULATORY_CARE_PROVIDER_SITE_OTHER): Payer: Medicare Other | Admitting: Internal Medicine

## 2023-06-05 ENCOUNTER — Encounter: Payer: Self-pay | Admitting: Internal Medicine

## 2023-06-05 VITALS — BP 126/78 | HR 75 | Ht 63.0 in | Wt 148.6 lb

## 2023-06-05 DIAGNOSIS — I10 Essential (primary) hypertension: Secondary | ICD-10-CM | POA: Diagnosis not present

## 2023-06-05 DIAGNOSIS — R5383 Other fatigue: Secondary | ICD-10-CM | POA: Diagnosis not present

## 2023-06-05 DIAGNOSIS — E7800 Pure hypercholesterolemia, unspecified: Secondary | ICD-10-CM | POA: Diagnosis not present

## 2023-06-05 DIAGNOSIS — E034 Atrophy of thyroid (acquired): Secondary | ICD-10-CM

## 2023-06-05 DIAGNOSIS — Z23 Encounter for immunization: Secondary | ICD-10-CM | POA: Diagnosis not present

## 2023-06-05 DIAGNOSIS — R7303 Prediabetes: Secondary | ICD-10-CM | POA: Diagnosis not present

## 2023-06-05 LAB — CBC WITH DIFFERENTIAL/PLATELET
Basophils Absolute: 0.1 10*3/uL (ref 0.0–0.1)
Basophils Relative: 0.9 % (ref 0.0–3.0)
Eosinophils Absolute: 0.2 10*3/uL (ref 0.0–0.7)
Eosinophils Relative: 2.4 % (ref 0.0–5.0)
HCT: 42.5 % (ref 36.0–46.0)
Hemoglobin: 13.6 g/dL (ref 12.0–15.0)
Lymphocytes Relative: 29.2 % (ref 12.0–46.0)
Lymphs Abs: 2.1 10*3/uL (ref 0.7–4.0)
MCHC: 32 g/dL (ref 30.0–36.0)
MCV: 86.2 fL (ref 78.0–100.0)
Monocytes Absolute: 0.5 10*3/uL (ref 0.1–1.0)
Monocytes Relative: 6.7 % (ref 3.0–12.0)
Neutro Abs: 4.3 10*3/uL (ref 1.4–7.7)
Neutrophils Relative %: 60.8 % (ref 43.0–77.0)
Platelets: 312 10*3/uL (ref 150.0–400.0)
RBC: 4.93 Mil/uL (ref 3.87–5.11)
RDW: 14.6 % (ref 11.5–15.5)
WBC: 7.1 10*3/uL (ref 4.0–10.5)

## 2023-06-05 LAB — COMPREHENSIVE METABOLIC PANEL
ALT: 19 U/L (ref 0–35)
AST: 24 U/L (ref 0–37)
Albumin: 4.3 g/dL (ref 3.5–5.2)
Alkaline Phosphatase: 55 U/L (ref 39–117)
BUN: 16 mg/dL (ref 6–23)
CO2: 28 meq/L (ref 19–32)
Calcium: 9.1 mg/dL (ref 8.4–10.5)
Chloride: 102 meq/L (ref 96–112)
Creatinine, Ser: 0.76 mg/dL (ref 0.40–1.20)
GFR: 72.7 mL/min (ref >60.00)
Glucose, Bld: 75 mg/dL (ref 70–99)
Potassium: 4.2 meq/L (ref 3.5–5.1)
Sodium: 138 meq/L (ref 135–145)
Total Bilirubin: 0.6 mg/dL (ref 0.2–1.2)
Total Protein: 6.7 g/dL (ref 6.0–8.3)

## 2023-06-05 LAB — LDL CHOLESTEROL, DIRECT: Direct LDL: 58 mg/dL

## 2023-06-05 LAB — LIPID PANEL
Cholesterol: 146 mg/dL (ref 0–200)
HDL: 56.8 mg/dL (ref >39.00)
LDL Cholesterol: 38 mg/dL (ref 0–99)
NonHDL: 89.06
Total CHOL/HDL Ratio: 3
Triglycerides: 256 mg/dL — ABNORMAL HIGH (ref 0.0–149.0)
VLDL: 51.2 mg/dL — ABNORMAL HIGH (ref 0.0–40.0)

## 2023-06-05 LAB — TSH: TSH: 5.22 u[IU]/mL (ref 0.35–5.50)

## 2023-06-05 LAB — MICROALBUMIN / CREATININE URINE RATIO
Creatinine,U: 23.6 mg/dL
Microalb Creat Ratio: 3 mg/g (ref 0.0–30.0)
Microalb, Ur: <0.7 mg/dL (ref 0.0–1.9)

## 2023-06-05 LAB — HEMOGLOBIN A1C: Hgb A1c MFr Bld: 6.2 % (ref 4.6–6.5)

## 2023-06-05 NOTE — Progress Notes (Unsigned)
Patient ID: Veronica Barron, female    DOB: 1940/08/29  Age: 82 y.o. MRN: [id number]  The patient is here for annual Medicare wellness examination and management of other chronic and acute problems.   The risk factors are reflected in the social history.  The roster of all physicians providing medical care to patient - is listed in the Snapshot section of the chart.  Activities of daily living:  The patient is 100% independent in all ADLs: dressing, toileting, feeding as well as independent mobility  Home safety : The patient has smoke detectors in the home. They wear seatbelts.  There are no firearms at home. There is no violence in the home.   There is no risks for hepatitis, STDs or HIV. There is no   history of blood transfusion. They have no travel history to infectious disease endemic areas of the world.  The patient has seen their dentist in the last six month. They have seen their eye doctor in the last year. They admit to slight hearing difficulty with regard to whispered voices and some television programs.  They have deferred audiologic testing in the last year.  They do not  have excessive sun exposure. Discussed the need for sun protection: hats, long sleeves and use of sunscreen if there is significant sun exposure.   Diet: the importance of a healthy diet is discussed. They do have a healthy diet.  The benefits of regular aerobic exercise were discussed. She walks 4 times per week ,  20 minutes.   Depression screen: there are no signs or vegative symptoms of depression- irritability, change in appetite, anhedonia, sadness/tearfullness.  Cognitive assessment: the patient manages all their financial and personal affairs and is actively engaged. They could relate day,date,year and events; recalled 2/3 objects at 3 minutes; performed clock-face test normally.  The following portions of the patient's history were reviewed and updated as appropriate: allergies, current medications, past  family history, past medical history,  past surgical history, past social history  and problem list.  Visual acuity was not assessed per patient preference since she has regular follow up with her ophthalmologist. Hearing and body mass index were assessed and reviewed.   During the course of the visit the patient was educated and counseled about appropriate screening and preventive services including : fall prevention , diabetes screening, nutrition counseling, colorectal cancer screening, and recommended immunizations.    CC: The primary encounter diagnosis was Hypothyroidism due to acquired atrophy of thyroid. Diagnoses of Pure hypercholesterolemia, Prediabetes, Essential hypertension, and Other fatigue were also pertinent to this visit.   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   She has a past surgical history that includes Tubal ligation; Cholecystectomy; Abdominal hysterectomy; left heart catheterization with coronary angiogram (N/A, 03/16/2013); percutaneous coronary stent intervention (pci-s) (03/16/2013); left heart catheterization with coronary angiogram (N/A, 05/23/2014); laparoscopic appendectomy (N/A, 04/26/2019); and Cataract extraction w/ intraocular lens implant (Bilateral, 01/07/2021).   Her family history includes Breast cancer in her maternal aunt; Coronary artery disease in her father; Dementia in her mother; Hyperlipidemia in  her father and mother; Hypertension in her father and mother; Uterine cancer (age of onset: 60) in her  sister.She reports that she has never smoked. She has never used smokeless tobacco. She reports current alcohol use. She reports that she does not use drugs.  Outpatient Medications Prior to Visit  Medication Sig Dispense Refill   ALFALFA PO Take 1 tablet by mouth daily.     aspirin EC 81 MG tablet Take 81 mg by mouth daily.     carvedilol (COREG) 6.25 MG tablet TAKE 1 TABLET BY MOUTH TWICE DAILY WITH MEALS 180 tablet 3   Evolocumab (REPATHA SURECLICK) 140 MG/ML SOAJ Inject 140mg  under the skin every 14 days 6 mL 3   lisinopril (ZESTRIL) 5 MG tablet Take 1 tablet (5 mg total) by mouth 2 (two) times daily. 180 tablet 3   loratadine (CLARITIN) 10 MG tablet Take 10 mg by mouth daily.     multivitamin-lutein (OCUVITE-LUTEIN) CAPS capsule Take 1 capsule by mouth daily.     sertraline (ZOLOFT) 50 MG tablet TAKE 1 TABLET DAILY        (GENERIC EQUIVALENT FOR    ZOLOFT) 90 tablet 1   SYNTHROID 88 MCG tablet TAKE 1 TABLET DAILY BEFORE BREAKFAST 90 tablet 1   vitamin B-12 (CYANOCOBALAMIN) 500 MCG tablet Take 500 mcg by mouth daily.     vitamin C (ASCORBIC ACID) 500 MG tablet Take 500 mg by mouth daily.     No facility-administered medications prior to visit.    Review of Systems  Objective:  BP (!) 154/74   Pulse 75   Ht 5\' 3"  (1.6 m)   Wt 148 lb 9.6 oz (67.4 kg)   SpO2 98%   BMI 26.32 kg/m   Physical Exam  Assessment & Plan:  Hypothyroidism due to acquired atrophy of thyroid -     TSH  Pure hypercholesterolemia -     Lipid panel -     LDL cholesterol, direct  Prediabetes -     Comprehensive metabolic panel -     Hemoglobin A1c -     Microalbumin / creatinine urine ratio  Essential hypertension -     Comprehensive metabolic panel -     Microalbumin / creatinine urine ratio  Other fatigue -     CBC with Differential/Platelet      I provided 40 minutes of  face-to-face time during this encounter reviewing patient's current problems and past surgeries,  recent labs and  imaging studies, providing counseling on the above mentioned problems , and coordination  of care .   Follow-up: No follow-ups on file.   Sherlene Shams, MD

## 2023-06-05 NOTE — Patient Instructions (Addendum)
YOUR BP IS ELEVATED TODAY, PLEASE SEND [place] SOME  CURRENT  HOME READINGS. YOUR MACHINE'S BATTERY MAY NEED TO BE CHANGED.    IF NOT,  YOU CAN SCHEDULE A NURSE VISIT AND BRING YOUR OLD MACHINE  ,  OR  CONSIDER A NEW BP MACHINE BY [facility].   I recommend that you have  The TDaP vaccine, WHICH IS DUE  and the Shingles vaccine.  PLEASE GET THEM at your local pharmacy because Medicare will not reimburse for them.      You might want to try using Relaxium for insomnia  (as seen on TV commercials) . It is available through [employer] and contains all natural supplements:  Melatonin 5 mg  Chamomile 25 mg Passionflower extract 75 mg GABA 100 mg Ashwaganda extract 125 mg Magnesium citrate, glycinate, oxide (100 mg)  L tryptophan 500 mg Valerest (proprietary  ingredient ; probably valeria root extract)

## 2023-06-07 NOTE — Assessment & Plan Note (Signed)
She remains at risk for diabetes . Given her age,  The use of anti hyperglycemics was decided against unless her a1c rises to 7.0  .  Continue  /resume PCSK9 inhibitor and ACE inhibitor.   Lab Results  Component Value Date   HGBA1C 6.2 06/05/2023   Lab Results  Component Value Date   MICROALBUR <0.7 06/05/2023   MICROALBUR <0.7 05/21/2022

## 2023-06-07 NOTE — Assessment & Plan Note (Signed)
Managed with Repatha due to statin myalgias.  LDL is well below 70   Lab Results  Component Value Date   CHOL 146 06/05/2023   HDL 56.80 06/05/2023   LDLCALC 38 06/05/2023   LDLDIRECT 58.0 06/05/2023   TRIG 256.0 (H) 06/05/2023   CHOLHDL 3 06/05/2023

## 2023-06-07 NOTE — Assessment & Plan Note (Addendum)
she reports compliance with lisinopril and carvedilol regimen but does not monitor readings at home.  She has been asked to check her readings at home and schedule an RN visit to calibrate her machine.  Cr and lytes normal.   Lab Results  Component Value Date   CREATININE 0.76 06/05/2023   Lab Results  Component Value Date   NA 138 06/05/2023   K 4.2 06/05/2023   CL 102 06/05/2023   CO2 28 06/05/2023

## 2023-06-10 ENCOUNTER — Telehealth: Payer: Self-pay | Admitting: Cardiology

## 2023-06-10 ENCOUNTER — Ambulatory Visit: Payer: Medicare Other | Attending: Cardiology | Admitting: Cardiology

## 2023-06-10 ENCOUNTER — Telehealth: Payer: Self-pay | Admitting: Cardiovascular Disease

## 2023-06-10 ENCOUNTER — Encounter: Payer: Self-pay | Admitting: Cardiology

## 2023-06-10 VITALS — BP 136/64 | HR 72 | Ht 63.0 in | Wt 149.6 lb

## 2023-06-10 DIAGNOSIS — E7800 Pure hypercholesterolemia, unspecified: Secondary | ICD-10-CM | POA: Diagnosis not present

## 2023-06-10 DIAGNOSIS — R0789 Other chest pain: Secondary | ICD-10-CM | POA: Diagnosis not present

## 2023-06-10 DIAGNOSIS — I2510 Atherosclerotic heart disease of native coronary artery without angina pectoris: Secondary | ICD-10-CM | POA: Diagnosis not present

## 2023-06-10 DIAGNOSIS — I10 Essential (primary) hypertension: Secondary | ICD-10-CM

## 2023-06-10 DIAGNOSIS — E034 Atrophy of thyroid (acquired): Secondary | ICD-10-CM

## 2023-06-10 MED ORDER — NITROGLYCERIN 0.4 MG SL SUBL: 0.4000 mg | SUBLINGUAL_TABLET | SUBLINGUAL | 2 refills | Status: DC | PRN

## 2023-06-10 NOTE — Patient Instructions (Signed)
Medication Instructions:  No changes *If you need a refill on your cardiac medications before your next appointment, please call your pharmacy*  Lab Work: No labs  Testing/Procedures: No testing  Follow-Up: At [facility], you and your health needs are our priority.  As part of our continuing mission to provide you with exceptional heart care, we have created designated Provider Care Teams.  These Care Teams include your primary Cardiologist (physician) and Advanced Practice Providers (APPs -  Physician Assistants and Nurse Practitioners) who all work together to provide you with the care you need, when you need it.  We recommend signing up for the patient portal called "MyChart".  Sign up information is provided on this After Visit Summary.  MyChart is used to connect with patients for Virtual Visits (Telemedicine).  Patients are able to view lab/test results, encounter notes, upcoming appointments, etc.  Non-urgent messages can be sent to your provider as well.   To learn more about what you can do with MyChart, go to [email or web address].    Your next appointment:   2 week(s)  Provider:   Reather Littler, NP

## 2023-06-10 NOTE — Telephone Encounter (Signed)
Patient identification verified by 2 forms. Marilynn Rail, RN    Called and spoke to patient  Patient states:   -home BP machine not working today, monitor gives a error message   -BP yesterday: 156/69  -had BP checked at [facility] on 11/23 and it was 177/84  -feels a little lightheaded   -Takes carvedilol and lisinopril twice a day   -does not skip or miss doses of medication  Patient denies:   -headaches   -visual changes or disturbances  Patient scheduled for OV 11/27 at 2:15pm with NP Chad  Reviewed ED warning signs/precautions  Patient verbalized understanding, no questions at this time

## 2023-06-10 NOTE — Progress Notes (Signed)
Cardiology Office Note    Date:  06/10/2023  ID:  CARIZMA MCMURRY, DOB [birth year], MRN [id number] PCP:  Sherlene Shams, MD  Cardiologist:  Peter Swaziland, MD  Electrophysiologist:  None   Chief Complaint: Hypertension   History of Present Illness: .    Veronica Barron is a 82 y.o. female with visit-pertinent history of coronary artery disease s/p stenting of the proximal LAD with DES in 2014, hypertension, hyperlipidemia, statin intolerance and hypothyroidism.  In September 2014 she underwent LHC with stenting of the proximal LAD with DES, the distal left circumflex was occluded with a left to left collaterals and right to left collaterals, the RCA was without significant disease, EF was 30%.  On follow-up echo in December 2014 LVEF improved to 50 to 55%, repeat cardiac cath in November 2015 in setting of chest pain showed occlusion of the distal LCx and the stent in the LAD was patent.  She was last seen by Dr. Swaziland on 09/24/2022, she remained stable from a cardiac perspective.  Today she presents for an acute visit regarding hypertension.  She reports that she wasn't feeling great yesterday and her home blood pressure cuff was not working. She went to [facility] to check her blood pressure which showed 177/80. She called the office requesting an appointment. Today she notes she has been having increased stress in the last week as her husband was recently diagnosed with atrial fibrillation. She notes that last night when she laid down to sleep she noticed some slight chest discomfort, described as a 3/10. She notes it has been intermittent throughout today, not associated with exertion, notes it is present when moving her shoulders or when putting her her undergarments.  She denies shortness of breath, diaphoresis, palpitations, lower extremity edema.   Labwork independently reviewed: 06/05/2023: Sodium 138, potassium 4.2, creatinine 0.76, AST 24, ALT 19  ROS: .   Today she denies shortness of  breath, lower extremity edema, fatigue, palpitations, melena, hematuria, hemoptysis, diaphoresis, weakness, presyncope, syncope, orthopnea, and PND.  All other systems are reviewed and otherwise negative. Studies Reviewed: Marland Kitchen    EKG:  EKG is ordered today, personally reviewed, demonstrating  EKG Interpretation Date/Time:  Wednesday June 10 2023 15:05:59 EST Ventricular Rate:  69 PR Interval:  176 QRS Duration:  74 QT Interval:  400 QTC Calculation: 428 R Axis:   67  Text Interpretation: Sinus rhythm with occasional Premature ventricular complexes Anteroseptal infarct (cited on or before 15-Mar-2007) ST & T wave abnormality, consider lateral ischemia When compared with ECG of 10-Jun-2023 15:05, No significant change was found Confirmed by Reather Littler [phone]) on 06/10/2023 4:57:02 PM    Current Reported Medications:.    Current Meds  Medication Sig   ALFALFA PO Take 1 tablet by mouth daily.   aspirin EC 81 MG tablet Take 81 mg by mouth daily.   carvedilol (COREG) 6.25 MG tablet TAKE 1 TABLET BY MOUTH TWICE DAILY WITH MEALS   Evolocumab (REPATHA SURECLICK) 140 MG/ML SOAJ Inject 140mg  under the skin every 14 days   lisinopril (ZESTRIL) 5 MG tablet Take 1 tablet (5 mg total) by mouth 2 (two) times daily.   loratadine (CLARITIN) 10 MG tablet Take 10 mg by mouth daily.   multivitamin-lutein (OCUVITE-LUTEIN) CAPS capsule Take 1 capsule by mouth daily.   nitroGLYCERIN (NITROSTAT) 0.4 MG SL tablet Place 1 tablet (0.4 mg total) under the tongue every 5 (five) minutes as needed for chest pain.   sertraline (ZOLOFT) 50 MG tablet TAKE  1 TABLET DAILY        (GENERIC EQUIVALENT FOR    ZOLOFT)   SYNTHROID 88 MCG tablet TAKE 1 TABLET DAILY BEFORE BREAKFAST   vitamin B-12 (CYANOCOBALAMIN) 500 MCG tablet Take 500 mcg by mouth daily.   vitamin C (ASCORBIC ACID) 500 MG tablet Take 500 mg by mouth daily.   Physical Exam:    VS:  BP 136/64   Pulse 72   Ht 5\' 3"  (1.6 m)   Wt 149 lb 9.6 oz (67.9 kg)    SpO2 97%   BMI 26.50 kg/m    Wt Readings from Last 3 Encounters:  06/10/23 149 lb 9.6 oz (67.9 kg)  06/05/23 148 lb 9.6 oz (67.4 kg)  09/24/22 150 lb (68 kg)    GEN: Well nourished, well developed in no acute distress NECK: No JVD; No carotid bruits CARDIAC: RRR, no murmurs, rubs, gallops RESPIRATORY:  Clear to auscultation without rales, wheezing or rhonchi  ABDOMEN: Soft, non-tender, non-distended EXTREMITIES:  No edema; No acute deformity   Asessement and Plan:.    Blood pressure: Patient notes that yesterday she checked her blood pressure when at [facility] and it was 177/80, she reports that her home blood pressure cuff has not been working.  Her initial blood pressure today was 146/72, on recheck was 136/64.  Patient will obtain a new blood pressure cuff and monitor her blood pressures for the next 2 weeks, salty 6 and blood pressure log provided.  She will continue lisinopril 5 mg twice daily and carvedilol 6.25 mg daily.  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  Cardiac cath in November 2015 showed continued stent patency.  Today she reports a chest discomfort that is intermittent and started last night when she laid down to go to sleep, she offers vague descriptions. She denies her chest pain being associated with exertion, notes she feels it more when moving her shoulders or when putting on her undergarments.  Overall sounds musculoskeletal in nature, her EKG is overall unchanged compared to priors. She was not having chest discomfort at time of visit. She does not feel that this pain is similar to her prior angina.  She denies shortness of breath, diaphoresis, palpitations or lower extremity edema.  She notes that she has been extremely stressed this week as her husband was recently diagnosed with atrial fibrillation.  Notes she just has  overall not felt well since his diagnosis.  Continue aspirin, Coreg and lisinopril.  Reviewed ED precautions.  Will send prescription for sublingual nitroglycerin given history of CAD.  Hyperlipidemia: Last LDL 38 on 06/05/2023.  Continue Repatha.  Hypothyroidism: TSH 5.22 on 06/05/2023.  Monitored and managed per PCP.    Disposition: F/u with Reather Littler, NP in two weeks.   Signed, Rip Harbour, NP

## 2023-06-10 NOTE — Telephone Encounter (Signed)
Pt c/o BP issue: STAT if pt c/o blurred vision, one-sided weakness or slurred speech  1. What are your last 5 BP readings? 150/73; 177/84  2. Are you having any other symptoms (ex. Dizziness, headache, blurred vision, passed out)? No   3. What is your BP issue? Experiencing higher than normal BP and would like to get BP checked

## 2023-06-10 NOTE — Telephone Encounter (Signed)
Please disregard

## 2023-06-23 ENCOUNTER — Telehealth: Payer: Self-pay | Admitting: Cardiology

## 2023-06-23 ENCOUNTER — Ambulatory Visit (INDEPENDENT_AMBULATORY_CARE_PROVIDER_SITE_OTHER): Payer: Medicare Other | Admitting: *Deleted

## 2023-06-23 ENCOUNTER — Telehealth: Payer: Self-pay | Admitting: Pharmacist Clinician (PhC)/ Clinical Pharmacy Specialist

## 2023-06-23 VITALS — BP 160/74 | HR 64 | Ht 63.0 in | Wt 148.0 lb

## 2023-06-23 DIAGNOSIS — Z0000 Encounter for general adult medical examination without abnormal findings: Secondary | ICD-10-CM | POA: Diagnosis not present

## 2023-06-23 MED ORDER — LISINOPRIL 10 MG PO TABS: ORAL_TABLET | ORAL | 3 refills | Status: DC

## 2023-06-23 NOTE — Telephone Encounter (Signed)
Spoke to patient Dr.Jordan advised to increase Lisinopril to 10 mg twice a day.Advised to monitor B/P and call back if continues to be elevated.

## 2023-06-23 NOTE — Telephone Encounter (Signed)
Spoke to patient she stated she had a nurse visit today with PCP.Stated B/P elevated 160/74 pulse 64.Stated she is taking all medications as prescribed.Stated PCP advised her to ask Dr.Jordan his recommendation.Advised Dr.Jordan is out of office today.I will send message to him for advice.

## 2023-06-23 NOTE — Telephone Encounter (Signed)
Pt needs [employer] grant renewed

## 2023-06-23 NOTE — Progress Notes (Signed)
Subjective:   Veronica Barron is a 82 y.o. female who presents for Medicare Annual (Subsequent) preventive examination.  Visit Complete: In person   Cardiac Risk Factors include: advanced age (>66men, >61 women);dyslipidemia;hypertension;Other (see comment), Risk factor comments: CAD     Objective:    Today's Vitals   06/23/23 1127 06/23/23 1146  BP: (!) 164/80 (!) 160/74  Pulse: 64   SpO2: 97%   Weight: 148 lb (67.1 kg)   Height: 5\' 3"  (1.6 m)    Body mass index is 26.22 kg/m.     06/23/2023   11:43 AM 06/18/2022   11:02 AM 05/31/2021   12:36 PM 05/30/2020   12:40 PM 05/30/2019   10:08 AM 04/27/2019    1:03 AM 04/26/2019    3:37 PM  Advanced Directives  Does Patient Have a Medical Advance Directive? Yes Yes Yes Yes Yes  No  Type of Estate agent of [place];Living will Healthcare Power of [place];Living will Healthcare Power of [place];Living will Healthcare Power of [place];Living will Living will;Healthcare Power of Attorney    Does patient want to make changes to medical advance directive?  No - Patient declined No - Patient declined No - Patient declined No - Patient declined    Copy of Healthcare Power of Attorney in Chart? No - copy requested Yes - validated most recent copy scanned in chart (See row information) Yes - validated most recent copy scanned in chart (See row information) Yes - validated most recent copy scanned in chart (See row information) Yes - validated most recent copy scanned in chart (See row information)    Would patient like information on creating a medical advance directive?      No - Patient declined     Current Medications (verified) Outpatient Encounter Medications as of 06/23/2023  Medication Sig   ALFALFA PO Take 1 tablet by mouth daily.   aspirin EC 81 MG tablet Take 81 mg by mouth daily.   carvedilol (COREG) 6.25 MG tablet TAKE 1 TABLET BY MOUTH TWICE DAILY WITH MEALS   Evolocumab (REPATHA SURECLICK) 140  MG/ML SOAJ Inject 140mg  under the skin every 14 days   lisinopril (ZESTRIL) 5 MG tablet Take 1 tablet (5 mg total) by mouth 2 (two) times daily.   loratadine (CLARITIN) 10 MG tablet Take 10 mg by mouth daily.   multivitamin-lutein (OCUVITE-LUTEIN) CAPS capsule Take 1 capsule by mouth daily.   nitroGLYCERIN (NITROSTAT) 0.4 MG SL tablet Place 1 tablet (0.4 mg total) under the tongue every 5 (five) minutes as needed for chest pain.   sertraline (ZOLOFT) 50 MG tablet TAKE 1 TABLET DAILY        (GENERIC EQUIVALENT FOR    ZOLOFT)   SYNTHROID 88 MCG tablet TAKE 1 TABLET DAILY BEFORE BREAKFAST   vitamin B-12 (CYANOCOBALAMIN) 500 MCG tablet Take 500 mcg by mouth daily.   vitamin C (ASCORBIC ACID) 500 MG tablet Take 500 mg by mouth daily.   No facility-administered encounter medications on file as of 06/23/2023.    Allergies (verified) Diphenhydramine hcl, Fenofibrate, Statins, and Zetia [ezetimibe]   History: Past Medical History:  Diagnosis Date   Allergic rhinitis, cause unspecified    CAD (coronary artery disease)    a. 03/2013 Abnl MV, EF 29%;  b. 03/2013 Cath/PCI: >M nl, LAD 99p (2.5x28 Promus Prem DES), LCX 100 after large OM1, RCA dominant, 20p, EF 30%.   Diverticulosis of colon    Hyperlipidemia    Hypertension    Ischemic cardiomyopathy  a. 03/2013 EF 30% by LV gram.;  b. Echo (12/14): Mod LVH with severe basal septal hypertrophy w/o LVOT obstr, EF 50-55%, normal wall motion, Gr 1 DD, MAC, mild MR, mild LAE, trivial eff.   Leiomyoma of uterus, unspecified    Unspecified hypothyroidism    Past Surgical History:  Procedure Laterality Date   ABDOMINAL HYSTERECTOMY     fibroid   at  age 46   CATARACT EXTRACTION W/ INTRAOCULAR LENS IMPLANT Bilateral 01/07/2021   Dr Delaney Meigs   CHOLECYSTECTOMY     LAPAROSCOPIC APPENDECTOMY N/A 04/26/2019   Procedure: APPENDECTOMY LAPAROSCOPIC;  Surgeon: Leafy Ro, MD;  Location: [facility] ORS;  Service: General;  Laterality: N/A;   LEFT HEART  CATHETERIZATION WITH CORONARY ANGIOGRAM N/A 03/16/2013   Procedure: LEFT HEART CATHETERIZATION WITH CORONARY ANGIOGRAM;  Surgeon: [name] Swaziland, MD;  Location: [facility] CATH LAB;  Service: Cardiovascular;  Laterality: N/A;   LEFT HEART CATHETERIZATION WITH CORONARY ANGIOGRAM N/A 05/23/2014   Procedure: LEFT HEART CATHETERIZATION WITH CORONARY ANGIOGRAM;  Surgeon: [name] Swaziland, MD;  Location: [facility] CATH LAB;  Service: Cardiovascular;  Laterality: N/A;   PERCUTANEOUS CORONARY STENT INTERVENTION (PCI-S)  03/16/2013   Procedure: PERCUTANEOUS CORONARY STENT INTERVENTION (PCI-S);  Surgeon: [name] Swaziland, MD;  Location: [facility] CATH LAB;  Service: Cardiovascular;;   TUBAL LIGATION     Family History  Problem Relation Age of Onset   Dementia Mother    Hyperlipidemia Mother    Hypertension Mother    Coronary artery disease Father    Hypertension Father    Hyperlipidemia Father    Uterine cancer Sister 41   Breast cancer Maternal Aunt    Social History   Socioeconomic History   Marital status: Married    Spouse name: Not on file   Number of children: 3   Years of education: Not on file   Highest education level: Not on file  Occupational History    Employer: RETIRED  Tobacco Use   Smoking status: Never   Smokeless tobacco: Never  Vaping Use   Vaping status: Never Used  Substance and Sexual Activity   Alcohol use: Yes    Comment: wine   Drug use: No   Sexual activity: Not Currently    Birth control/protection: Post-menopausal  Other Topics Concern   Not on file  Social History Narrative   High school graduate, married in [place], 3 sons ages 31, 31, 49; 4 grandchildren, 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   Social Determinants of Health   Financial Resource Strain: Low Risk  (06/23/2023)   Overall Financial Resource Strain (CARDIA)    Difficulty of Paying Living Expenses: Not hard at all  Food Insecurity: No Food Insecurity (06/23/2023)   Hunger Vital Sign    Worried About  Running Out of Food in the Last Year: Never true    Ran Out of Food in the Last Year: Never true  Transportation Needs: No Transportation Needs (06/23/2023)   PRAPARE - Administrator, Civil Service (Medical): No    Lack of Transportation (Non-Medical): No  Physical Activity: Insufficiently Active (06/23/2023)   Exercise Vital Sign    Days of Exercise per Week: 4 days    Minutes of Exercise per Session: 30 min  Stress: Stress Concern Present (06/23/2023)   [employer] of Occupational Health - Occupational Stress Questionnaire    Feeling of Stress : To some extent  Social Connections: Socially Integrated (06/23/2023)   Social Connection and Isolation Panel [NHANES]  Frequency of Communication with Friends and Family: Twice a week    Frequency of Social Gatherings with Friends and Family: Once a week    Attends Religious Services: More than 4 times per year    Active Member of [employer] or Organizations: Yes    Attends Engineer, structural: More than 4 times per year    Marital Status: Married    Tobacco Counseling Counseling given: Not Answered   Clinical Intake:  Pre-visit preparation completed: Yes  Pain : No/denies pain     BMI - recorded: 26.22 Nutritional Status: BMI 25 -29 Overweight Nutritional Risks: None Diabetes: No  How often do you need to have someone help you when you read instructions, pamphlets, or other written materials from your doctor or pharmacy?: 1 - Never  Interpreter Needed?: No  Information entered by :: R. Kamyrah Feeser LPN   Activities of Daily Living    06/23/2023   11:32 AM  In your present state of health, do you have any difficulty performing the following activities:  Hearing? 0  Vision? 0  Comment readers  Difficulty concentrating or making decisions? 0  Walking or climbing stairs? 0  Dressing or bathing? 0  Doing errands, shopping? 0  Preparing Food and eating ? N  Using the Toilet? N  In the past six months,  have you accidently leaked urine? Y  Do you have problems with loss of bowel control? N  Managing your Medications? N  Managing your Finances? N  Housekeeping or managing your Housekeeping? N    Patient Care Team: Sherlene Shams, MD as PCP - General (Internal Medicine) Swaziland, [name], MD as PCP - Cardiology (Cardiology) Jimmye Norman, MD (General Surgery) Ernesto Rutherford, MD (Ophthalmology)  Indicate any recent Medical Services you may have received from other than [facility] providers in the past year (date may be approximate).     Assessment:   This is a routine wellness examination for Rasheida.  Hearing/Vision screen Hearing Screening - Comments:: No issues Vision Screening - Comments:: readers   Goals Addressed             This Visit's Progress    Patient Stated       Continue to eat well and exercise       Depression Screen    06/23/2023   11:37 AM 06/05/2023    1:06 PM 06/18/2022   11:00 AM 05/21/2022    3:19 PM 02/18/2022    2:05 PM 05/31/2021   12:42 PM 04/23/2021    1:53 PM  PHQ 2/9 Scores  PHQ - 2 Score 0 0 0 0 0 0 0  PHQ- 9 Score 0          Fall Risk    06/23/2023   11:34 AM 06/05/2023    1:06 PM 06/18/2022   11:03 AM 05/21/2022    3:19 PM 02/18/2022    2:04 PM  Fall Risk   Falls in the past year? 0 0 0 0 0  Number falls in past yr: 0 0 0  0  Injury with Fall? 0 0 0  0  Risk for fall due to : No Fall Risks No Fall Risks No Fall Risks No Fall Risks No Fall Risks  Follow up Falls prevention discussed;Falls evaluation completed Falls evaluation completed Falls evaluation completed;Falls prevention discussed Falls evaluation completed Falls evaluation completed    MEDICARE RISK AT HOME: Medicare Risk at Home Any stairs in or around the home?: Yes If so, are there  any without handrails?: No Home free of loose throw rugs in walkways, pet beds, electrical cords, etc?: Yes Adequate lighting in your home to reduce risk of falls?: Yes Life alert?: No Use of a  cane, walker or w/c?: No Grab bars in the bathroom?: No Shower chair or bench in shower?: No Elevated toilet seat or a handicapped toilet?: No  TIMED UP AND GO:  Was the test performed?  Yes  Length of time to ambulate 10 feet: 8 sec Gait steady and fast without use of assistive device    Cognitive Function:    05/19/2016   11:24 AM  MMSE - Mini Mental State Exam  Orientation to time 5  Orientation to Place 5  Registration 3  Attention/ Calculation 5  Recall 3  Language- name 2 objects 2  Language- repeat 1  Language- follow 3 step command 3  Language- read & follow direction 1  Write a sentence 1  Copy design 1  Total score 30        06/23/2023   11:45 AM 06/23/2023   11:43 AM 06/18/2022   11:15 AM 05/31/2021   12:43 PM 05/26/2018   10:20 AM  6CIT Screen  What Year? 0 points 0 points 0 points 0 points 0 points  What month? 0 points 0 points 0 points 0 points 0 points  What time? 0 points 0 points 0 points 0 points 0 points  Count back from 20 0 points  0 points 0 points 0 points  Months in reverse 0 points  0 points 0 points 0 points  Repeat phrase 0 points  0 points 0 points 0 points  Total Score 0 points  0 points 0 points 0 points    Immunizations Immunization History  Administered Date(s) Administered   Fluad Quad(high Dose 65+) 06/22/2020, 04/23/2021, 05/21/2022   Fluad Trivalent(High Dose 65+) 06/05/2023   Influenza Whole 07/14/2007, 05/15/2009, 06/13/2010   Influenza, High Dose Seasonal PF 04/29/2017, 05/26/2018   Influenza,inj,Quad PF,6+ Mos 03/09/2013, 04/04/2014, 04/24/2015, 04/25/2016   Influenza-Unspecified 05/19/2011   PFIZER(Purple Top)SARS-COV-2 Vaccination 08/04/2019, 08/25/2019   Pneumococcal Conjugate-13 03/15/2014   Pneumococcal Polysaccharide-23 05/25/2017   Tetanus 03/09/2013   Zoster, Live 11/01/2007    TDAP status: Due, Education has been provided regarding the importance of this vaccine. Advised may receive this vaccine at local  pharmacy or Health Dept. Aware to provide a copy of the vaccination record if obtained from local pharmacy or Health Dept. Verbalized acceptance and understanding.  Flu Vaccine status: Up to date  Pneumococcal vaccine status: Up to date  Covid-19 vaccine status: Declined, Education has been provided regarding the importance of this vaccine but patient still declined. Advised may receive this vaccine at local pharmacy or Health Dept.or vaccine clinic. Aware to provide a copy of the vaccination record if obtained from local pharmacy or Health Dept. Verbalized acceptance and understanding.  Qualifies for Shingles Vaccine? Yes   Zostavax completed Yes   Shingrix Completed?: No.    Education has been provided regarding the importance of this vaccine. Patient has been advised to call insurance company to determine out of pocket expense if they have not yet received this vaccine. Advised may also receive vaccine at local pharmacy or Health Dept. Verbalized acceptance and understanding.  Screening Tests Health Maintenance  Topic Date Due   OPHTHALMOLOGY EXAM  Never done   Zoster Vaccines- Shingrix (1 of 2) 06/20/1960   DTaP/Tdap/Td (1 - Tdap) 03/10/2013   COVID-19 Vaccine (3 - Pfizer risk series) 09/22/2019  FOOT EXAM  04/23/2022   Medicare Annual Wellness (AWV)  06/19/2023   HEMOGLOBIN A1C  12/03/2023   Diabetic kidney evaluation - eGFR measurement  06/04/2024   Diabetic kidney evaluation - Urine ACR  06/04/2024   MAMMOGRAM  05/26/2025   Pneumonia Vaccine 63+ Years old  Completed   INFLUENZA VACCINE  Completed   DEXA SCAN  Completed   HPV VACCINES  Aged Out   Hepatitis C Screening  Discontinued    Health Maintenance  Health Maintenance Due  Topic Date Due   OPHTHALMOLOGY EXAM  Never done   Zoster Vaccines- Shingrix (1 of 2) 06[birth year]20/1959   DTaP/Tdap/Td (1 - Tdap) 03/10/2013   COVID-19 Vaccine (3 - Pfizer risk series) 09/22/2019   FOOT EXAM  04/23/2022   Medicare Annual Wellness (AWV)   06/19/2023    Colorectal cancer screening: No longer required.   Mammogram status: Completed 05/2023. Repeat every year  Bone Density status: Completed 09/2011. Results reflect: Bone density results: NORMAL. Repeat every 2 years. Patient declines  Lung Cancer Screening: (Low Dose CT Chest recommended if Age 1-80 years, 20 pack-year currently smoking OR have quit w/in 15years.) does not qualify.     Additional Screening:  Hepatitis C Screening: does qualify; Completed 04/2015  Vision Screening: Recommended annual ophthalmology exams for early detection of glaucoma and other disorders of the eye. Is the patient up to date with their annual eye exam?  Yes  Who is the provider or what is the name of the office in which the patient attends annual eye exams? Dr. Hyacinth Meeker If pt is not established with a provider, would they like to be referred to a provider to establish care? No .   Dental Screening: Recommended annual dental exams for proper oral hygiene    Community Resource Referral / Chronic Care Management: CRR required this visit?  No   CCM required this visit?  No     Plan:     I have personally reviewed and noted the following in the patient's chart:   Medical and social history Use of alcohol, tobacco or illicit drugs  Current medications and supplements including opioid prescriptions. Patient is not currently taking opioid prescriptions. Functional ability and status Nutritional status Physical activity Advanced directives List of other physicians Hospitalizations, surgeries, and ER visits in previous 12 months Vitals Screenings to include cognitive, depression, and falls Referrals and appointments  In addition, I have reviewed and discussed with patient certain preventive protocols, quality metrics, and best practice recommendations. A written personalized care plan for preventive services as well as general preventive health recommendations were provided to  patient.     Sydell Axon, LPN   [id number]   After Visit Summary: (In Person-Printed) AVS printed and given to the patient  Nurse Notes: Patient's blood pressure elevated in the office. Discuss with Dr. Darrick Huntsman. Patient is followed by cardiologist Dr. Peter Swaziland and he prescribes her blood pressure medication. Patient will contact her cardiologist today with blood pressure readings. Patient denies any symptoms at this time and stated that she feels fine.

## 2023-06-23 NOTE — Telephone Encounter (Signed)
Pt called in stating she had a nurse visit today with her PCP and was told to contact Cardiology about her bp being elevated. Please advise.   06/23/23 1146 160/74 Abnormal      06/23/23 1127 164/80 Abnormal

## 2023-06-23 NOTE — Patient Instructions (Signed)
Ms. Meer , Thank you for taking time to come for your Medicare Wellness Visit. I appreciate your ongoing commitment to your health goals. Please review the following plan we discussed and let me know if I can assist you in the future.   Referrals/Orders/Follow-Ups/Clinician Recommendations: Contact cardiologist today and update your vaccines  This is a list of the screening recommended for you and due dates:  Health Maintenance  Topic Date Due   Eye exam for diabetics  Never done   Zoster (Shingles) Vaccine (1 of 2) 06/20/1959   DTaP/Tdap/Td vaccine (1 - Tdap) 03/10/2013   COVID-19 Vaccine (3 - Pfizer risk series) 09/22/2019   Complete foot exam   04/23/2022   Hemoglobin A1C  12/03/2023   Yearly kidney function blood test for diabetes  06/04/2024   Yearly kidney health urinalysis for diabetes  06/04/2024   Medicare Annual Wellness Visit  06/22/2024   Mammogram  05/26/2025   Pneumonia Vaccine  Completed   Flu Shot  Completed   DEXA scan (bone density measurement)  Completed   HPV Vaccine  Aged Out   Hepatitis C Screening  Discontinued    Advanced directives: (Copy Requested) Please bring a copy of your health care power of attorney and living will to the office to be added to your chart at your convenience.  Next Medicare Annual Wellness Visit scheduled for next year: Yes 06/28/24 @ 1:30

## 2023-06-25 ENCOUNTER — Ambulatory Visit: Payer: Medicare Other | Admitting: Cardiology

## 2023-09-13 ENCOUNTER — Other Ambulatory Visit: Payer: Self-pay | Admitting: Cardiology

## 2023-09-13 DIAGNOSIS — T466X5A Adverse effect of antihyperlipidemic and antiarteriosclerotic drugs, initial encounter: Secondary | ICD-10-CM

## 2023-09-13 DIAGNOSIS — I700 Atherosclerosis of aorta: Secondary | ICD-10-CM

## 2023-09-13 DIAGNOSIS — I25118 Atherosclerotic heart disease of native coronary artery with other forms of angina pectoris: Secondary | ICD-10-CM

## 2023-09-16 ENCOUNTER — Other Ambulatory Visit: Payer: Self-pay | Admitting: Pharmacist

## 2023-09-16 DIAGNOSIS — I700 Atherosclerosis of aorta: Secondary | ICD-10-CM

## 2023-09-16 DIAGNOSIS — I25118 Atherosclerotic heart disease of native coronary artery with other forms of angina pectoris: Secondary | ICD-10-CM

## 2023-09-16 DIAGNOSIS — T466X5A Adverse effect of antihyperlipidemic and antiarteriosclerotic drugs, initial encounter: Secondary | ICD-10-CM

## 2023-09-16 MED ORDER — REPATHA SURECLICK 140 MG/ML ~~LOC~~ SOAJ: SUBCUTANEOUS | 3 refills | Status: AC

## 2023-09-24 NOTE — Progress Notes (Unsigned)
 Veronica Barron Date of Birth: January 11, 1941 Medical Record #[id number]  History of Present Illness: Veronica Barron is seen today for followup CAD. She has a history of hypertension, hyperlipidemia, and hypothyroidism. In Sept 2014 she had stenting of the proximal LAD with DES.  The distal left circumflex was occluded with left to left collaterals and right-to-left collaterals. The right coronary was without significant disease. Ejection fraction was 30%.  Her other disease was treated medically. On followup Echo in Dec. 2014 EF had improved to 50-55%. In November 2015 she had a significant episode of chest pain. Repeat cardiac cath showed occlusion of the distal LCx and the stent in the LAD was patent.   She has a history of intolerance to multiple statins, Zetia and Lopid, and fenofibrates. She was previously unable afford a PCSK 9 inhibitor and was seen by Pharm D and it was authorized and she has been taking it .   She was admitted in October 2020 with acute appendicitis. She had appy without complications.   On follow up today she reports she is feeling very well. She notes occasional mild chest pressure that is very mild. She does walk regularly and tends a garden. Her BP has been consistently high.    Current Outpatient Medications on File Prior to Visit  Medication Sig Dispense Refill   ALFALFA PO Take 1 tablet by mouth daily.     aspirin EC 81 MG tablet Take 81 mg by mouth daily.     carvedilol (COREG) 6.25 MG tablet TAKE 1 TABLET BY MOUTH TWICE DAILY WITH MEALS 180 tablet 3   Evolocumab (REPATHA SURECLICK) 140 MG/ML SOAJ INJECT 1 PEN EVERY 2 WEEKS 6 mL 3   lisinopril (ZESTRIL) 10 MG tablet Take 10 mg twice a day 180 tablet 3   loratadine (CLARITIN) 10 MG tablet Take 10 mg by mouth daily.     multivitamin-lutein (OCUVITE-LUTEIN) CAPS capsule Take 1 capsule by mouth daily.     sertraline (ZOLOFT) 50 MG tablet TAKE 1 TABLET DAILY        (GENERIC EQUIVALENT FOR    ZOLOFT) 90 tablet 1    SYNTHROID 88 MCG tablet TAKE 1 TABLET DAILY BEFORE BREAKFAST 90 tablet 1   vitamin B-12 (CYANOCOBALAMIN) 500 MCG tablet Take 500 mcg by mouth daily.     vitamin C (ASCORBIC ACID) 500 MG tablet Take 500 mg by mouth daily.     nitroGLYCERIN (NITROSTAT) 0.4 MG SL tablet Place 1 tablet (0.4 mg total) under the tongue every 5 (five) minutes as needed for chest pain. 45 tablet 2   No current facility-administered medications on file prior to visit.    Allergies  Allergen Reactions   Diphenhydramine Hcl     REACTION: TACHYCARDIA   Fenofibrate Other (See Comments)    constipation   Statins     Myalgia    Zetia [Ezetimibe] Other (See Comments)    Myalgias     Past Medical History:  Diagnosis Date   Allergic rhinitis, cause unspecified    CAD (coronary artery disease)    a. 03/2013 Abnl MV, EF 29%;  b. 03/2013 Cath/PCI: >M nl, LAD 99p (2.5x28 Promus Prem DES), LCX 100 after large OM1, RCA dominant, 20p, EF 30%.   Diverticulosis of colon    Hyperlipidemia    Hypertension    Ischemic cardiomyopathy    a. 03/2013 EF 30% by LV gram.;  b. Echo (12/14): Mod LVH with severe basal septal hypertrophy w/o LVOT obstr, EF 50-55%, normal wall  motion, Gr 1 DD, MAC, mild MR, mild LAE, trivial eff.   Leiomyoma of uterus, unspecified    Unspecified hypothyroidism     Past Surgical History:  Procedure Laterality Date   ABDOMINAL HYSTERECTOMY     fibroid   at  age 63   CATARACT EXTRACTION W/ INTRAOCULAR LENS IMPLANT Bilateral 01/07/2021   Dr Delaney Meigs   CHOLECYSTECTOMY     LAPAROSCOPIC APPENDECTOMY N/A 04/26/2019   Procedure: APPENDECTOMY LAPAROSCOPIC;  Surgeon: Leafy Ro, MD;  Location: [facility] ORS;  Service: General;  Laterality: N/A;   LEFT HEART CATHETERIZATION WITH CORONARY ANGIOGRAM N/A 03/16/2013   Procedure: LEFT HEART CATHETERIZATION WITH CORONARY ANGIOGRAM;  Surgeon: Brinden Kincheloe M [place], MD;  Location: [facility] CATH LAB;  Service: Cardiovascular;  Laterality: N/A;   LEFT HEART CATHETERIZATION WITH  CORONARY ANGIOGRAM N/A 05/23/2014   Procedure: LEFT HEART CATHETERIZATION WITH CORONARY ANGIOGRAM;  Surgeon: Cortlynn Hollinsworth M [place], MD;  Location: [facility] CATH LAB;  Service: Cardiovascular;  Laterality: N/A;   PERCUTANEOUS CORONARY STENT INTERVENTION (PCI-S)  03/16/2013   Procedure: PERCUTANEOUS CORONARY STENT INTERVENTION (PCI-S);  Surgeon: Terrianne Cavness M [place], MD;  Location: [facility] CATH LAB;  Service: Cardiovascular;;   TUBAL LIGATION      Social History   Tobacco Use  Smoking Status Never  Smokeless Tobacco Never    Social History   Substance and Sexual Activity  Alcohol Use Yes   Alcohol/week: 1.0 standard drink of alcohol   Types: 1 Glasses of wine per week   Comment: wine    Family History  Problem Relation Age of Onset   Dementia Mother    Hyperlipidemia Mother    Hypertension Mother    Coronary artery disease Father    Hypertension Father    Hyperlipidemia Father    Uterine cancer Sister 42   Breast cancer Maternal Aunt     Review of Systems: As noted in history of present illness.  All other systems were reviewed and are negative.  Physical Exam: BP (!) 172/88   Pulse 74   Ht 5\' 3"  (1.6 m)   Wt 148 lb 12.8 oz (67.5 kg)   SpO2 98%   BMI 26.36 kg/m  GENERAL:  Well appearing WF in NAD HEENT:  PERRL, EOMI, sclera are clear. Oropharynx is clear. NECK:  No jugular venous distention, carotid upstroke brisk and symmetric, no bruits, no thyromegaly or adenopathy LUNGS:  Clear to auscultation bilaterally CHEST:  Unremarkable HEART:  RRR,  PMI not displaced or sustained,S1 and S2 within normal limits, no S3, no S4: no clicks, no rubs, no murmurs ABD:  Soft, nontender. BS +, no masses or bruits. No hepatomegaly, no splenomegaly EXT:  2 + pulses throughout, no edema, no cyanosis no clubbing SKIN:  Warm and dry.  No rashes NEURO:  Alert and oriented x 3. Cranial nerves II through XII intact. PSYCH:  Cognitively intact  LABORATORY DATA: Lab Results  Component Value Date   WBC 7.1  06/05/2023   HGB 13.6 06/05/2023   HCT 42.5 06/05/2023   PLT 312.0 06/05/2023   GLUCOSE 75 06/05/2023   CHOL 146 06/05/2023   TRIG 256.0 (H) 06/05/2023   HDL 56.80 06/05/2023   LDLDIRECT 58.0 06/05/2023   LDLCALC 38 06/05/2023   ALT 19 06/05/2023   AST 24 06/05/2023   NA 138 06/05/2023   K 4.2 06/05/2023   CL 102 06/05/2023   CREATININE 0.76 06/05/2023   BUN 16 06/05/2023   CO2 28 06/05/2023   TSH 5.22 06/05/2023   INR 0.92 05/17/2014  HGBA1C 6.2 06/05/2023   MICROALBUR <0.7 06/05/2023   Assessment / Plan: 1. Coronary disease status post stenting of the proximal LAD in September 2014 with drug-eluting stent. 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 Cardiac cath in November 2015 showed continued stent patency. Stable class 1 symptoms.   Continue Coreg and ASA.   2. Hyperlipidemia. History of intolerance to multiple statins, Zetia, fenofibrates, and Lopid. Excellent response to Repatha with marked reduction in LDL to 58. Tolerating well.   3. Hypertension- BP now high. On Coreg 6.25 mg bid and lisinopril 10 mg bid. Will add chlorthalidone 25 mg daily. Check BMET in 3 weeks. Arrange follow up in HTN clinic.   4. Hypothyroidism. TSH normal.   Follow up in 6 months

## 2023-09-28 ENCOUNTER — Encounter: Payer: Self-pay | Admitting: Cardiology

## 2023-09-28 ENCOUNTER — Ambulatory Visit: Payer: Medicare Other | Attending: Cardiology | Admitting: Cardiology

## 2023-09-28 VITALS — BP 172/88 | HR 74 | Ht 63.0 in | Wt 148.8 lb

## 2023-09-28 DIAGNOSIS — I10 Essential (primary) hypertension: Secondary | ICD-10-CM

## 2023-09-28 DIAGNOSIS — I2510 Atherosclerotic heart disease of native coronary artery without angina pectoris: Secondary | ICD-10-CM

## 2023-09-28 DIAGNOSIS — E7800 Pure hypercholesterolemia, unspecified: Secondary | ICD-10-CM

## 2023-09-28 MED ORDER — CHLORTHALIDONE 25 MG PO TABS: 25.0000 mg | ORAL_TABLET | Freq: Every day | ORAL | 3 refills | Status: DC

## 2023-09-28 NOTE — Patient Instructions (Signed)
 Medication Instructions:  Start Chlorthalidone 25 mg daily Continue all other medications  *If you need a refill on your cardiac medications before your next appointment, please call your pharmacy*   Lab Work: Bmet to be done in 3 weeks Tue 4/8   Testing/Procedures: None ordered   Follow-Up: At [facility], you and your health needs are our priority.  As part of our continuing mission to provide you with exceptional heart care, we have created designated Provider Care Teams.  These Care Teams include your primary Cardiologist (physician) and Advanced Practice Providers (APPs -  Physician Assistants and Nurse Practitioners) who all work together to provide you with the care you need, when you need it.  We recommend signing up for the patient portal called "MyChart".  Sign up information is provided on this After Visit Summary.  MyChart is used to connect with patients for Virtual Visits (Telemedicine).  Patients are able to view lab/test results, encounter notes, upcoming appointments, etc.  Non-urgent messages can be sent to your provider as well.   To learn more about what you can do with MyChart, go to [email or web address].    Your next appointment:  6 months   Call in July to schedule Sept appointment     Provider:  Dr.Jordan

## 2023-10-21 ENCOUNTER — Telehealth: Payer: Self-pay | Admitting: Cardiology

## 2023-10-21 DIAGNOSIS — I10 Essential (primary) hypertension: Secondary | ICD-10-CM

## 2023-10-21 NOTE — Telephone Encounter (Signed)
 Pt c/o medication issue:  1. Name of Medication: chlorthalidone (HYGROTON) 25 MG tablet   2. How are you currently taking this medication (dosage and times per day)? As written  3. Are you having a reaction (difficulty breathing--STAT)? no  4. What is your medication issue? It makes pt dizzy and makes her feet hurt. She tried taking half and it still did the same thing

## 2023-10-21 NOTE — Telephone Encounter (Signed)
 Patient identification verified by 2 forms. Shade Flood, RN     Tried calling patient. No answer. LVMTCB

## 2023-10-22 NOTE — Telephone Encounter (Signed)
 Pt returning call

## 2023-10-22 NOTE — Telephone Encounter (Signed)
 Spoke to patient she stated she cannot take Chlorthalidone.Stated causes dizziness and pain in both feet.B/P ranging 150/80 pulse in 70's.She stopped taking several days ago and feels better.Advised I will send message to Dr.Jordan.

## 2023-10-22 NOTE — Telephone Encounter (Signed)
 Patient is calling back but states that she would like for [facility] to call her back. Please advise

## 2023-10-22 NOTE — Telephone Encounter (Signed)
 Called patient left message on personal voice mail to call back.

## 2023-10-23 MED ORDER — LOSARTAN POTASSIUM 50 MG PO TABS: 50.0000 mg | ORAL_TABLET | Freq: Every day | ORAL | 6 refills | Status: DC

## 2023-10-23 NOTE — Telephone Encounter (Signed)
 Spoke to patient Dr.Jordan advised to take Losartan 50 mg daily.Appointment scheduled with PharmD in hypertension clinic Mon 6/9 at 9:30 am at new office.

## 2023-10-23 NOTE — Telephone Encounter (Signed)
 Veronica Barron

## 2023-11-09 ENCOUNTER — Other Ambulatory Visit: Payer: Self-pay | Admitting: Internal Medicine

## 2023-11-25 ENCOUNTER — Other Ambulatory Visit: Payer: Self-pay | Admitting: Internal Medicine

## 2023-12-21 ENCOUNTER — Other Ambulatory Visit (HOSPITAL_COMMUNITY): Payer: Self-pay

## 2023-12-21 ENCOUNTER — Ambulatory Visit: Attending: Internal Medicine | Admitting: Pharmacist

## 2023-12-21 DIAGNOSIS — I10 Essential (primary) hypertension: Secondary | ICD-10-CM | POA: Diagnosis not present

## 2023-12-21 MED ORDER — AMLODIPINE BESYLATE 2.5 MG PO TABS
2.5000 mg | ORAL_TABLET | Freq: Every day | ORAL | 3 refills | Status: DC
  Filled 2023-12-21: qty 30, 30d supply, fill #0
  Filled 2024-01-12 – 2024-01-13 (×2): qty 30, 30d supply, fill #1
  Filled 2024-02-09: qty 30, 30d supply, fill #2

## 2023-12-21 NOTE — Assessment & Plan Note (Signed)
 Assessment: Home blood pressures improved but still above goal less than 130/80 Patient on both an ACE and an ARB-will need to stop 1 of these Denies any dizziness or lightheadedness as long as she splits up the doses of her medications Walks about 20 minutes daily and works in the garden Tries to limit her sodium intake  Plan: Stop losartan  Continue lisinopril  10 mg twice a day and carvedilol  6.25 mg twice a day Start amlodipine 2.5 mg daily Follow-up in 1 month

## 2023-12-21 NOTE — Progress Notes (Signed)
 Patient ID: Veronica Barron                 DOB: 01/02/1941                      MRN: [id number]      HPI: Veronica Barron is a 83 y.o. female referred by Dr. [place] to HTN clinic. PMH is significant forhypertension, hyperlipidemia, hypothyroidism CAD s/p DES to the LAD in 2014.   BP at office visit 09/28/23 was 172/88. Chlorthalidone  was added, but patient called office reporting dizziness and feel pain, even when she took half.  Losartan  50 mg daily was added.  However patient already on lisinopril .  Patient presents today accompanied by her significant other for follow-up.  She has been taking half a tablet of losartan  twice a day because when she takes a whole tablet she feels lightheaded.  She denies any headaches or palpitations.  Blood pressure generally better in the mornings and early afternoons and increases in the evenings.  Blood pressure generally between 130-150's/70's.  She does not add extra salt to her food but if she cooks she does cook with salt.  Eats out a decent amount.  Limited caffeine intake.  Walks about 20 minutes daily.   Current HTN meds: carvedilol  6.25mg  twice a day, lisinopril  10mg  twice a day, losartan  50mg  daily Previously tried: diltiazem  (stopped due to low EF), chlorthalidone  (dizziness, feet hurt), metoprolol , hydrochlorothiazide  (stopped when lisinopril  added) BP goal: <130/80  Family History:  Family History  Problem Relation Age of Onset   Dementia Mother    Hyperlipidemia Mother    Hypertension Mother    Coronary artery disease Father    Hypertension Father    Hyperlipidemia Father    Uterine cancer Sister 40   Breast cancer Maternal Aunt     Social History: no tobacco, rare ETOH  Diet: small amount of diet coke, decaf tea, water Eats out often Does not salt food BBQ, fish, steak, some fast foods  Exercise:  Works in garden, walks some everyday-about 20 minutes  Home BP readings:  144/78, 158/82, 132/75, 135/73, 158/83, 131/73,  140/80  Wt Readings from Last 3 Encounters:  09/28/23 148 lb 12.8 oz (67.5 kg)  06/23/23 148 lb (67.1 kg)  06/10/23 149 lb 9.6 oz (67.9 kg)   BP Readings from Last 3 Encounters:  09/28/23 (!) 172/88  06/23/23 (!) 160/74  06/10/23 136/64   Pulse Readings from Last 3 Encounters:  09/28/23 74  06/23/23 64  06/10/23 72    Renal function: CrCl cannot be calculated (Patient's most recent lab result is older than the maximum 21 days allowed.).  Past Medical History:  Diagnosis Date   Allergic rhinitis, cause unspecified    CAD (coronary artery disease)    a. 03/2013 Abnl MV, EF 29%;  b. 03/2013 Cath/PCI: >M nl, LAD 99p (2.5x28 Promus Prem DES), LCX 100 after large OM1, RCA dominant, 20p, EF 30%.   Diverticulosis of colon    Hyperlipidemia    Hypertension    Ischemic cardiomyopathy    a. 03/2013 EF 30% by LV gram.;  b. Echo (12/14): Mod LVH with severe basal septal hypertrophy w/o LVOT obstr, EF 50-55%, normal wall motion, Gr 1 DD, MAC, mild MR, mild LAE, trivial eff.   Leiomyoma of uterus, unspecified    Unspecified hypothyroidism     Current Outpatient Medications on File Prior to Visit  Medication Sig Dispense Refill   ALFALFA PO Take 1 tablet  by mouth daily.     aspirin  EC 81 MG tablet Take 81 mg by mouth daily.     carvedilol  (COREG ) 6.25 MG tablet TAKE 1 TABLET BY MOUTH TWICE DAILY WITH MEALS 180 tablet 3   Evolocumab  (REPATHA  SURECLICK) 140 MG/ML SOAJ INJECT 1 PEN EVERY 2 WEEKS 6 mL 3   lisinopril  (ZESTRIL ) 10 MG tablet Take 10 mg twice a day 180 tablet 3   loratadine (CLARITIN) 10 MG tablet Take 10 mg by mouth daily.     multivitamin-lutein  (OCUVITE-LUTEIN ) CAPS capsule Take 1 capsule by mouth daily.     sertraline  (ZOLOFT ) 50 MG tablet TAKE 1 TABLET DAILY        (GENERIC EQUIVALENT FOR    ZOLOFT ) 90 tablet 1   SYNTHROID  88 MCG tablet TAKE 1 TABLET DAILY BEFORE BREAKFAST 90 tablet 1   vitamin B-12 (CYANOCOBALAMIN) 500 MCG tablet Take 500 mcg by mouth daily.     vitamin C   (ASCORBIC ACID ) 500 MG tablet Take 500 mg by mouth daily.     nitroGLYCERIN  (NITROSTAT ) 0.4 MG SL tablet Place 1 tablet (0.4 mg total) under the tongue every 5 (five) minutes as needed for chest pain. 45 tablet 2   No current facility-administered medications on file prior to visit.    Allergies  Allergen Reactions   Diphenhydramine Hcl     REACTION: TACHYCARDIA   Fenofibrate  Other (See Comments)    constipation   Statins     Myalgia    Zetia  [Ezetimibe ] Other (See Comments)    Myalgias     There were no vitals taken for this visit.   Assessment/Plan:   1. Hypertension -  HTN (hypertension) Assessment: Home blood pressures improved but still above goal less than 130/80 Patient on both an ACE and an ARB-will need to stop 1 of these Denies any dizziness or lightheadedness as long as she splits up the doses of her medications Walks about 20 minutes daily and works in the garden Tries to limit her sodium intake  Plan: Stop losartan  Continue lisinopril  10 mg twice a day and carvedilol  6.25 mg twice a day Start amlodipine 2.5 mg daily Follow-up in 1 month    Thank you  Jarret Torre D Tymeir Weathington, Pharm.Monika Annas, CPP  [facility] [address] [place], [place] [address]  Phone: [phone]; Fax: [phone]

## 2023-12-21 NOTE — Patient Instructions (Addendum)
 Please STOP losartan  Start taking amlodipine 2.5mg  daily Continue lisinopril  10mg  twice a day and carvedilol  6.25mg  twice a day  Please call me at [phone] with any questions  Your blood pressure goal is < 130/67mmHg   Important lifestyle changes to control high blood pressure  Intervention  Effect on the BP   Weight loss Weight loss is one of the most effective lifestyle changes for controlling blood pressure. If you're overweight or obese, losing even a small amount of weight can help reduce blood pressure.    Blood pressure can decrease by 1 millimeter of mercury (mmHg) with each kilogram (about 2.2 pounds) of weight lost.   Exercise regularly As a general goal, aim for 30 minutes of moderate physical activity every day.    Regular physical activity can lower blood pressure by 5 - 8 mmHg.   Eat a healthy diet Eat a diet rich in whole grains, fruits, vegetables, lean meat, and low-fat dairy products. Limit processed foods, saturated fat, and sweets.    A heart-healthy diet can lower high blood pressure by 10 mmHg.   Reduce salt (sodium) in your diet Aim for 000mg  of sodium each day. Avoid deli meats, canned food, and frozen microwave meals which are high in sodium.     Limiting sodium can reduce blood pressure by 5 mmHg.   Limit alcohol One drink equals 12 ounces of beer, 5 ounces of wine, or 1.5 ounces of 80-proof liquor.    Limiting alcohol to < 1 drink a day for women or < 2 drinks a day for men can help lower blood pressure by about 4 mmHg.   To check your pressure at home you will need to:   Sit up in a chair, with feet flat on the floor and back supported. Do not cross your ankles or legs. Rest your left arm so that the cuff is about heart level. If the cuff goes on your upper arm, then just relax your arm on the table, arm of the chair, or your lap. If you have a wrist cuff, hold your wrist against your chest at heart level. Place the cuff snugly around  your arm, about 1 inch above the crease of your elbow. The cords should be inside the groove of your elbow.  Sit quietly, with the cuff in place, for about 5 minutes. Then press the power button to start a reading. Do not talk or move while the reading is taking place.  Record your readings on a sheet of paper. Although most cuffs have a memory, it is often easier to see a pattern developing when the numbers are all in front of you.  You can repeat the reading after 1-3 minutes if it is recommended.   Make sure your bladder is empty and you have not had caffeine or tobacco within the last 30 minutes   Always bring your blood pressure log with you to your appointments. If you have not brought your monitor in to be double checked for accuracy, please bring it to your next appointment.   You can find a list of validated (accurate) blood pressure cuffs at: validatebp.org

## 2024-01-12 ENCOUNTER — Other Ambulatory Visit (HOSPITAL_COMMUNITY): Payer: Self-pay

## 2024-01-18 ENCOUNTER — Ambulatory Visit: Attending: Pharmacist | Admitting: Pharmacist

## 2024-01-18 NOTE — Progress Notes (Deleted)
 Patient ID: Veronica Barron                 DOB: [birth year]                      MRN: [id number]      HPI: Veronica Barron is a 83 y.o. female referred by Dr. Swaziland to HTN clinic. PMH is significant forhypertension, hyperlipidemia, hypothyroidism CAD s/p DES to the LAD in 2014.   BP at office visit 09/28/23 was 172/88. Chlorthalidone  was added, but patient called office reporting dizziness and feel pain, even when she took half.  Losartan  50 mg daily was added.  However patient already on lisinopril .  Patient presents today accompanied by her significant other for follow-up.  She has been taking half a tablet of losartan  twice a day because when she takes a whole tablet she feels lightheaded.  She denies any headaches or palpitations.  Blood pressure generally better in the mornings and early afternoons and increases in the evenings.  Blood pressure generally between 130-150's/70's.  She does not add extra salt to her food but if she cooks she does cook with salt.  Eats out a decent amount.  Limited caffeine intake.  Walks about 20 minutes daily.   Current HTN meds: carvedilol  6.25mg  twice a day, lisinopril  10mg  twice a day, losartan  50mg  daily Previously tried: diltiazem  (stopped due to low EF), chlorthalidone  (dizziness, feet hurt), metoprolol , hydrochlorothiazide  (stopped when lisinopril  added) BP goal: <130/80  Family History:  Family History  Problem Relation Age of Onset   Dementia Mother    Hyperlipidemia Mother    Hypertension Mother    Coronary artery disease Father    Hypertension Father    Hyperlipidemia Father    Uterine cancer Sister 55   Breast cancer Maternal Aunt     Social History: no tobacco, rare ETOH  Diet: small amount of diet coke, decaf tea, water Eats out often Does not salt food BBQ, fish, steak, some fast foods  Exercise:  Works in garden, walks some everyday-about 20 minutes  Home BP readings:  144/78, 158/82, 132/75, 135/73, 158/83, 131/73,  140/80  Wt Readings from Last 3 Encounters:  09/28/23 148 lb 12.8 oz (67.5 kg)  06/23/23 148 lb (67.1 kg)  06/10/23 149 lb 9.6 oz (67.9 kg)   BP Readings from Last 3 Encounters:  09/28/23 (!) 172/88  06/23/23 (!) 160/74  06/10/23 136/64   Pulse Readings from Last 3 Encounters:  09/28/23 74  06/23/23 64  06/10/23 72    Renal function: CrCl cannot be calculated (Patient's most recent lab result is older than the maximum 21 days allowed.).  Past Medical History:  Diagnosis Date   Allergic rhinitis, cause unspecified    CAD (coronary artery disease)    a. 03/2013 Abnl MV, EF 29%;  b. 03/2013 Cath/PCI: >M nl, LAD 99p (2.5x28 Promus Prem DES), LCX 100 after large OM1, RCA dominant, 20p, EF 30%.   Diverticulosis of colon    Hyperlipidemia    Hypertension    Ischemic cardiomyopathy    a. 03/2013 EF 30% by LV gram.;  b. Echo (12/14): Mod LVH with severe basal septal hypertrophy w/o LVOT obstr, EF 50-55%, normal wall motion, Gr 1 DD, MAC, mild MR, mild LAE, trivial eff.   Leiomyoma of uterus, unspecified    Unspecified hypothyroidism     Current Outpatient Medications on File Prior to Visit  Medication Sig Dispense Refill   ALFALFA PO Take 1 tablet  by mouth daily.     amLODipine  (NORVASC ) 2.5 MG tablet Take 1 tablet (2.5 mg total) by mouth daily. 90 tablet 3   aspirin  EC 81 MG tablet Take 81 mg by mouth daily.     carvedilol  (COREG ) 6.25 MG tablet TAKE 1 TABLET BY MOUTH TWICE DAILY WITH MEALS 180 tablet 3   Evolocumab  (REPATHA  SURECLICK) 140 MG/ML SOAJ INJECT 1 PEN EVERY 2 WEEKS 6 mL 3   lisinopril  (ZESTRIL ) 10 MG tablet Take 10 mg twice a day 180 tablet 3   loratadine (CLARITIN) 10 MG tablet Take 10 mg by mouth daily.     multivitamin-lutein  (OCUVITE-LUTEIN ) CAPS capsule Take 1 capsule by mouth daily.     nitroGLYCERIN  (NITROSTAT ) 0.4 MG SL tablet Place 1 tablet (0.4 mg total) under the tongue every 5 (five) minutes as needed for chest pain. 45 tablet 2   sertraline  (ZOLOFT ) 50 MG  tablet TAKE 1 TABLET DAILY        (GENERIC EQUIVALENT FOR    ZOLOFT ) 90 tablet 1   SYNTHROID  88 MCG tablet TAKE 1 TABLET DAILY BEFORE BREAKFAST 90 tablet 1   vitamin B-12 (CYANOCOBALAMIN) 500 MCG tablet Take 500 mcg by mouth daily.     vitamin C  (ASCORBIC ACID ) 500 MG tablet Take 500 mg by mouth daily.     No current facility-administered medications on file prior to visit.    Allergies  Allergen Reactions   Diphenhydramine Hcl     REACTION: TACHYCARDIA   Fenofibrate  Other (See Comments)    constipation   Statins     Myalgia    Zetia  [Ezetimibe ] Other (See Comments)    Myalgias     There were no vitals taken for this visit.   Assessment/Plan:   1. Hypertension -  No problem-specific Assessment & Plan notes found for this encounter.     Thank you  Leathie Weich D Samanatha Brammer, Pharm.JONETTA SARAN, CPP [facility] A Division of [facility] [address] [place], [place] [address]  Phone: [phone]; Fax: [phone]

## 2024-01-22 ENCOUNTER — Telehealth: Payer: Self-pay

## 2024-01-22 NOTE — Telephone Encounter (Signed)
 Called patient several times her phone rings busy.Called patient's husband left message on personal voice mail to have patient call me.I need to let her know about having a bmet done.

## 2024-01-25 ENCOUNTER — Telehealth: Payer: Self-pay

## 2024-01-25 NOTE — Telephone Encounter (Signed)
 Called patient left message on personal voice mail to call back you need to have a bmet done and appointment with PharmD.

## 2024-01-25 NOTE — Telephone Encounter (Signed)
 Spoke to patient.She will have a bmet today 7/14 or tomorrow 7/15.Appointment scheduled with Pharm D Friday 8/22 at 9:30 am.

## 2024-01-26 ENCOUNTER — Ambulatory Visit: Payer: Self-pay | Admitting: Cardiology

## 2024-01-26 LAB — BASIC METABOLIC PANEL WITH GFR
BUN/Creatinine Ratio: 20 (ref 12–28)
BUN: 14 mg/dL (ref 8–27)
CO2: 22 mmol/L (ref 20–29)
Calcium: 9.3 mg/dL (ref 8.7–10.3)
Chloride: 100 mmol/L (ref 96–106)
Creatinine, Ser: 0.7 mg/dL (ref 0.57–1.00)
Glucose: 72 mg/dL (ref 70–99)
Potassium: 4.5 mmol/L (ref 3.5–5.2)
Sodium: 138 mmol/L (ref 134–144)
eGFR: 86 mL/min/1.73 (ref >59)

## 2024-02-10 ENCOUNTER — Telehealth: Payer: Self-pay | Admitting: Cardiology

## 2024-02-10 NOTE — Telephone Encounter (Signed)
 Pt c/o medication issue:  1. Name of Medication:  amLODipine  (NORVASC ) 2.5 MG tablet   2. How are you currently taking this medication (dosage and times per day)?    3. Are you having a reaction (difficulty breathing--STAT)? no  4. What is your medication issue? Patient states the wrong medication was sent to the pharmacy for her. She stated it was suppose to be for the above medication but it was for losartan . Calling with questions about it. Please advise

## 2024-02-10 NOTE — Telephone Encounter (Signed)
Patient returned RN Cheryl's call.

## 2024-02-10 NOTE — Telephone Encounter (Signed)
 Called patient back about message. Patient stated she is not suppose to be on Losartan  and her pharmacy gave her losartan . Informed patient that the Pharmacist discontinue her losartan  at her last visit in June. Encouraged patient to take losartan  back to the pharmacy and let them know she is not on this medication. Patient agreed to plan.

## 2024-02-10 NOTE — Telephone Encounter (Signed)
 Called patient left message on personal voice mail to call back.

## 2024-02-18 ENCOUNTER — Other Ambulatory Visit: Payer: Self-pay | Admitting: Cardiology

## 2024-02-19 NOTE — Telephone Encounter (Signed)
 For review

## 2024-02-22 ENCOUNTER — Telehealth: Payer: Self-pay | Admitting: Cardiology

## 2024-02-22 ENCOUNTER — Other Ambulatory Visit: Payer: Self-pay

## 2024-02-22 MED ORDER — CARVEDILOL 6.25 MG PO TABS: 6.2500 mg | ORAL_TABLET | Freq: Two times a day (BID) | ORAL | 3 refills | Status: DC

## 2024-02-22 NOTE — Telephone Encounter (Signed)
*  STAT* If patient is at the pharmacy, call can be transferred to refill team.   1. Which medications need to be refilled? (please list name of each medication and dose if known) carvedilol  (COREG ) 6.25 MG tablet   2. Which pharmacy/location (including street and city if local pharmacy) is medication to be sent to? [facility] [id number] - [place], [place] - [address]   3. Do they need a 30 day or 90 day supply? 7 days

## 2024-02-22 NOTE — Telephone Encounter (Signed)
 Coreg  refill sent to [facility] ok to use generic.

## 2024-03-04 ENCOUNTER — Other Ambulatory Visit (HOSPITAL_COMMUNITY): Payer: Self-pay

## 2024-03-04 ENCOUNTER — Ambulatory Visit: Attending: Internal Medicine | Admitting: Pharmacist

## 2024-03-04 VITALS — BP 148/78 | HR 57

## 2024-03-04 DIAGNOSIS — I2510 Atherosclerotic heart disease of native coronary artery without angina pectoris: Secondary | ICD-10-CM

## 2024-03-04 DIAGNOSIS — I10 Essential (primary) hypertension: Secondary | ICD-10-CM

## 2024-03-04 MED ORDER — AMLODIPINE BESYLATE 2.5 MG PO TABS
2.5000 mg | ORAL_TABLET | Freq: Every day | ORAL | 0 refills | Status: DC
  Filled 2024-03-04: qty 30, 30d supply, fill #0

## 2024-03-04 MED ORDER — AMLODIPINE BESYLATE 2.5 MG PO TABS: 2.5000 mg | ORAL_TABLET | Freq: Every day | ORAL | 3 refills | Status: DC

## 2024-03-04 MED ORDER — NITROGLYCERIN 0.4 MG SL SUBL
0.4000 mg | SUBLINGUAL_TABLET | SUBLINGUAL | 2 refills | Status: DC | PRN
  Filled 2024-03-04: qty 25, 1d supply, fill #0

## 2024-03-04 NOTE — Assessment & Plan Note (Signed)
 Assessment: Her blood pressure today in clinic is elevated but her home blood pressure readings are much more controlled Reports a little bit of dizziness after taking her amlodipine  that lasts about an hour Home blood pressures mainly high 120s to low 130s/70s.  Occasional lower or higher reading. I would say given her age and complaints of dizziness that a systolic less than 140 would be acceptable  Plan: Try taking amlodipine  at bedtime to see if this helps with dizziness Continue amlodipine  2.5 mg daily, lisinopril  10 mg daily and carvedilol  6.25 mg twice a day Follow-up with Dr. Swaziland next month Continue checking blood pressure at home.

## 2024-03-04 NOTE — Patient Instructions (Addendum)
 Try taking the amlodipine  in the evening to see if that helps with the dizziness Continue carvedilol  6.25mg  twice a day, lisinopril  10mg  twice a day Continue checking blood pressure on a regular basis. Bring your blood pressure readings to appointment with Dr. Swaziland.   Your blood pressure goal is < 130/56mmHg    Important lifestyle changes to control high blood pressure  Intervention  Effect on the BP   Weight loss Weight loss is one of the most effective lifestyle changes for controlling blood pressure. If you're overweight or obese, losing even a small amount of weight can help reduce blood pressure.    Blood pressure can decrease by 1 millimeter of mercury (mmHg) with each kilogram (about 2.2 pounds) of weight lost.   Exercise regularly As a general goal, aim for 30 minutes of moderate physical activity every day.    Regular physical activity can lower blood pressure by 5 - 8 mmHg.   Eat a healthy diet Eat a diet rich in whole grains, fruits, vegetables, lean meat, and low-fat dairy products. Limit processed foods, saturated fat, and sweets.    A heart-healthy diet can lower high blood pressure by 10 mmHg.   Reduce salt (sodium) in your diet Aim for 000mg  of sodium each day. Avoid deli meats, canned food, and frozen microwave meals which are high in sodium.     Limiting sodium can reduce blood pressure by 5 mmHg.   Limit alcohol One drink equals 12 ounces of beer, 5 ounces of wine, or 1.5 ounces of 80-proof liquor.    Limiting alcohol to < 1 drink a day for women or < 2 drinks a day for men can help lower blood pressure by about 4 mmHg.   To check your pressure at home you will need to:   Sit up in a chair, with feet flat on the floor and back supported. Do not cross your ankles or legs. Rest your left arm so that the cuff is about heart level. If the cuff goes on your upper arm, then just relax your arm on the table, arm of the chair, or your lap. If you have a wrist  cuff, hold your wrist against your chest at heart level. Place the cuff snugly around your arm, about 1 inch above the crease of your elbow. The cords should be inside the groove of your elbow.  Sit quietly, with the cuff in place, for about 5 minutes. Then press the power button to start a reading. Do not talk or move while the reading is taking place.  Record your readings on a sheet of paper. Although most cuffs have a memory, it is often easier to see a pattern developing when the numbers are all in front of you.  You can repeat the reading after 1-3 minutes if it is recommended.   Make sure your bladder is empty and you have not had caffeine or tobacco within the last 30 minutes   Always bring your blood pressure log with you to your appointments. If you have not brought your monitor in to be double checked for accuracy, please bring it to your next appointment.   You can find a list of validated (accurate) blood pressure cuffs at: validatebp.org

## 2024-03-04 NOTE — Progress Notes (Signed)
 Patient ID: Veronica Barron                 DOB: [birth year]                      MRN: [id number]      HPI: Veronica Barron is a 83 y.o. female referred by Dr. Swaziland to HTN clinic. PMH is significant for hypertension, hyperlipidemia, hypothyroidism, CAD s/p DES to the LAD in 2014.   BP at office visit 09/28/23 was 172/88. Chlorthalidone  was added, but patient called office reporting dizziness and feel pain, even when she took half.  Losartan  50 mg daily was added.  However, patient already on lisinopril .  I saw patient 12/21/23 in the office. I stopped her duplicate RAS therapy (stopped losartan ) and started her on amlodipine  2.5mg  daily.   Today accompanied by her husband for follow-up.  She reports feeling pretty good.  She does admit that her neuropathy is a little worse and complains of a little bit of dizziness about an hour after taking her amlodipine .  She separates her amlodipine  about an hour after her carvedilol  and lisinopril  in the morning.  This is tolerable.  She brings in a good number of blood pressure readings from home.  Few systolic readings around 116 rare 140 and 150.  Mostly blood pressures are in the high 120s or low 130s systolic.  Diastolic mainly 70s.   Current HTN meds: carvedilol  6.25mg  twice a day, lisinopril  10mg  twice a day, amlodipine  2.5mg  daily Previously tried: diltiazem  (stopped due to low EF), chlorthalidone  (dizziness, feet hurt), metoprolol , hydrochlorothiazide  (stopped when lisinopril  added) BP goal: <130/80  Family History:  Family History  Problem Relation Age of Onset   Dementia Mother    Hyperlipidemia Mother    Hypertension Mother    Coronary artery disease Father    Hypertension Father    Hyperlipidemia Father    Uterine cancer Sister 67   Breast cancer Maternal Aunt     Social History: no tobacco, rare ETOH  Diet: small amount of diet coke, decaf tea, water Eats out often Does not salt food BBQ, fish, steak, some fast foods  Exercise:   Works in garden, walks some everyday-about 20 minutes  Home BP readings:  106/76-157/85 mostly high 120's to low 130's/70's  Wt Readings from Last 3 Encounters:  09/28/23 148 lb 12.8 oz (67.5 kg)  06/23/23 148 lb (67.1 kg)  06/10/23 149 lb 9.6 oz (67.9 kg)   BP Readings from Last 3 Encounters:  03/04/24 (!) 148/78  09/28/23 (!) 172/88  06/23/23 (!) 160/74   Pulse Readings from Last 3 Encounters:  03/04/24 (!) 57  09/28/23 74  06/23/23 64    Renal function: CrCl cannot be calculated (Patient's most recent lab result is older than the maximum 21 days allowed.).  Past Medical History:  Diagnosis Date   Allergic rhinitis, cause unspecified    CAD (coronary artery disease)    a. 03/2013 Abnl MV, EF 29%;  b. 03/2013 Cath/PCI: >M nl, LAD 99p (2.5x28 Promus Prem DES), LCX 100 after large OM1, RCA dominant, 20p, EF 30%.   Diverticulosis of colon    Hyperlipidemia    Hypertension    Ischemic cardiomyopathy    a. 03/2013 EF 30% by LV gram.;  b. Echo (12/14): Mod LVH with severe basal septal hypertrophy w/o LVOT obstr, EF 50-55%, normal wall motion, Gr 1 DD, MAC, mild MR, mild LAE, trivial eff.   Leiomyoma of uterus, unspecified  Unspecified hypothyroidism     Current Outpatient Medications on File Prior to Visit  Medication Sig Dispense Refill   aspirin  EC 81 MG tablet Take 81 mg by mouth daily.     carvedilol  (COREG ) 6.25 MG tablet Take 1 tablet (6.25 mg total) by mouth 2 (two) times daily with a meal. 180 tablet 3   Evolocumab  (REPATHA  SURECLICK) 140 MG/ML SOAJ INJECT 1 PEN EVERY 2 WEEKS 6 mL 3   lisinopril  (ZESTRIL ) 10 MG tablet Take 10 mg twice a day 180 tablet 3   loratadine (CLARITIN) 10 MG tablet Take 10 mg by mouth daily.     multivitamin-lutein  (OCUVITE-LUTEIN ) CAPS capsule Take 1 capsule by mouth daily.     sertraline  (ZOLOFT ) 50 MG tablet TAKE 1 TABLET DAILY        (GENERIC EQUIVALENT FOR    ZOLOFT ) 90 tablet 1   SYNTHROID  88 MCG tablet TAKE 1 TABLET DAILY BEFORE  BREAKFAST 90 tablet 1   vitamin B-12 (CYANOCOBALAMIN) 500 MCG tablet Take 500 mcg by mouth daily.     vitamin C  (ASCORBIC ACID ) 500 MG tablet Take 500 mg by mouth daily.     ALFALFA PO Take 1 tablet by mouth daily.     No current facility-administered medications on file prior to visit.    Allergies  Allergen Reactions   Diphenhydramine Hcl     REACTION: TACHYCARDIA   Fenofibrate  Other (See Comments)    constipation   Statins     Myalgia    Zetia  [Ezetimibe ] Other (See Comments)    Myalgias     Blood pressure (!) 148/78, pulse (!) 57.   Assessment/Plan:   1. Hypertension -  HTN (hypertension) Assessment: Her blood pressure today in clinic is elevated but her home blood pressure readings are much more controlled Reports a little bit of dizziness after taking her amlodipine  that lasts about an hour Home blood pressures mainly high 120s to low 130s/70s.  Occasional lower or higher reading. I would say given her age and complaints of dizziness that a systolic less than 140 would be acceptable  Plan: Try taking amlodipine  at bedtime to see if this helps with dizziness Continue amlodipine  2.5 mg daily, lisinopril  10 mg daily and carvedilol  6.25 mg twice a day Follow-up with Dr. Swaziland next month Continue checking blood pressure at home.    Thank you  Veronica Barron D Veronica Barron, Pharm.Veronica Barron, CPP [facility] Division of [facility] [address], [place], [place] [address]  Phone: [phone]; Fax: [phone]

## 2024-03-22 NOTE — Progress Notes (Signed)
 Veronica Barron Date of Birth: [birth year] Medical Record #[id number]  History of Present Illness: Veronica Barron is seen today for followup CAD. She has a history of hypertension, hyperlipidemia, and hypothyroidism. In Sept 2014 she had stenting of the proximal LAD with DES.  The distal left circumflex was occluded with left to left collaterals and right-to-left collaterals. The right coronary was without significant disease. Ejection fraction was 30%.  Her other disease was treated medically. On followup Echo in Dec. 2014 EF had improved to 50-55%. In November 2015 she had a significant episode of chest pain. Repeat cardiac cath showed occlusion of the distal LCx and the stent in the LAD was patent.   She has a history of intolerance to multiple statins, Zetia  and Lopid, and fenofibrates. She was previously unable afford a PCSK 9 inhibitor and was seen by Pharm D and it was authorized and she has been taking it .   She was admitted in October 2020 with acute appendicitis. She had appy without complications.   When last seen BP was high. She did not tolerate chlorthalidone . Added low dose amlodipine . Continued on lisinopril  an Coreg . Followed in HTN clnic with satisfactory control.   On follow up today she is feeling well. Denies any chest pain or dyspnea. Active working in her garden. Sometimes feels a little lightheaded. Notes BP readings at home are good.    Current Outpatient Medications on File Prior to Visit  Medication Sig Dispense Refill   ALFALFA PO Take 1 tablet by mouth daily.     aspirin  EC 81 MG tablet Take 81 mg by mouth daily.     carvedilol  (COREG ) 6.25 MG tablet Take 1 tablet (6.25 mg total) by mouth 2 (two) times daily with a meal. 180 tablet 3   Evolocumab  (REPATHA  SURECLICK) 140 MG/ML SOAJ INJECT 1 PEN EVERY 2 WEEKS 6 mL 3   lisinopril  (ZESTRIL ) 10 MG tablet Take 10 mg twice a day 180 tablet 3   loratadine (CLARITIN) 10 MG tablet Take 10 mg by mouth daily.      multivitamin-lutein  (OCUVITE-LUTEIN ) CAPS capsule Take 1 capsule by mouth daily.     nitroGLYCERIN  (NITROSTAT ) 0.4 MG SL tablet Place 1 tablet (0.4 mg total) under the tongue every 5 (five) minutes as needed for chest pain. 50 tablet 2   sertraline  (ZOLOFT ) 50 MG tablet TAKE 1 TABLET DAILY        (GENERIC EQUIVALENT FOR    ZOLOFT ) 90 tablet 1   SYNTHROID  88 MCG tablet TAKE 1 TABLET DAILY BEFORE BREAKFAST 90 tablet 1   vitamin B-12 (CYANOCOBALAMIN) 500 MCG tablet Take 500 mcg by mouth daily.     vitamin C  (ASCORBIC ACID ) 500 MG tablet Take 500 mg by mouth daily.     No current facility-administered medications on file prior to visit.    Allergies  Allergen Reactions   Diphenhydramine Hcl     REACTION: TACHYCARDIA   Fenofibrate  Other (See Comments)    constipation   Statins     Myalgia    Zetia  [Ezetimibe ] Other (See Comments)    Myalgias     Past Medical History:  Diagnosis Date   Allergic rhinitis, cause unspecified    CAD (coronary artery disease)    a. 03/2013 Abnl MV, EF 29%;  b. 03/2013 Cath/PCI: >M nl, LAD 99p (2.5x28 Promus Prem DES), LCX 100 after large OM1, RCA dominant, 20p, EF 30%.   Diverticulosis of colon    Hyperlipidemia    Hypertension  Ischemic cardiomyopathy    a. 03/2013 EF 30% by LV gram.;  b. Echo (12/14): Mod LVH with severe basal septal hypertrophy w/o LVOT obstr, EF 50-55%, normal wall motion, Gr 1 DD, MAC, mild MR, mild LAE, trivial eff.   Leiomyoma of uterus, unspecified    Unspecified hypothyroidism     Past Surgical History:  Procedure Laterality Date   ABDOMINAL HYSTERECTOMY     fibroid   at  age 25   CATARACT EXTRACTION W/ INTRAOCULAR LENS IMPLANT Bilateral 01/07/2021   Dr Meridee   CHOLECYSTECTOMY     LAPAROSCOPIC APPENDECTOMY N/A 04/26/2019   Procedure: APPENDECTOMY LAPAROSCOPIC;  Surgeon: Jordis Laneta FALCON, MD;  Location: [facility] ORS;  Service: General;  Laterality: N/A;   LEFT HEART CATHETERIZATION WITH CORONARY ANGIOGRAM N/A 03/16/2013    Procedure: LEFT HEART CATHETERIZATION WITH CORONARY ANGIOGRAM;  Surgeon: Avonda Toso M Swaziland, MD;  Location: [facility] CATH LAB;  Service: Cardiovascular;  Laterality: N/A;   LEFT HEART CATHETERIZATION WITH CORONARY ANGIOGRAM N/A 05/23/2014   Procedure: LEFT HEART CATHETERIZATION WITH CORONARY ANGIOGRAM;  Surgeon: Treva Huyett M [place], MD;  Location: [facility] CATH LAB;  Service: Cardiovascular;  Laterality: N/A;   PERCUTANEOUS CORONARY STENT INTERVENTION (PCI-S)  03/16/2013   Procedure: PERCUTANEOUS CORONARY STENT INTERVENTION (PCI-S);  Surgeon: Viktorya Arguijo M [place], MD;  Location: [facility] CATH LAB;  Service: Cardiovascular;;   TUBAL LIGATION      Social History   Tobacco Use  Smoking Status Never  Smokeless Tobacco Never    Social History   Substance and Sexual Activity  Alcohol Use Yes   Alcohol/week: 1.0 standard drink of alcohol   Types: 1 Glasses of wine per week   Comment: wine    Family History  Problem Relation Age of Onset   Dementia Mother    Hyperlipidemia Mother    Hypertension Mother    Coronary artery disease Father    Hypertension Father    Hyperlipidemia Father    Uterine cancer Sister 69   Breast cancer Maternal Aunt     Review of Systems: As noted in history of present illness.  All other systems were reviewed and are negative.  Physical Exam: BP 116/66 (BP Location: Right Arm, Patient Position: Sitting, Cuff Size: Normal)   Pulse 66   Ht 5' 3 (1.6 m)   Wt 145 lb (65.8 kg)   BMI 25.69 kg/m  GENERAL:  Well appearing WF in NAD HEENT:  PERRL, EOMI, sclera are clear. Oropharynx is clear. NECK:  No jugular venous distention, carotid upstroke brisk and symmetric, no bruits, no thyromegaly or adenopathy LUNGS:  Clear to auscultation bilaterally CHEST:  Unremarkable HEART:  RRR,  PMI not displaced or sustained,S1 and S2 within normal limits, no S3, no S4: no clicks, no rubs, no murmurs ABD:  Soft, nontender. BS +, no masses or bruits. No hepatomegaly, no splenomegaly EXT:  2 + pulses  throughout, no edema, no cyanosis no clubbing SKIN:  Warm and dry.  No rashes NEURO:  Alert and oriented x 3. Cranial nerves II through XII intact. PSYCH:  Cognitively intact  LABORATORY DATA: Lab Results  Component Value Date   WBC 7.1 06/05/2023   HGB 13.6 06/05/2023   HCT 42.5 06/05/2023   PLT 312.0 06/05/2023   GLUCOSE 72 01/25/2024   CHOL 146 06/05/2023   TRIG 256.0 (H) 06/05/2023   HDL 56.80 06/05/2023   LDLDIRECT 58.0 06/05/2023   LDLCALC 38 06/05/2023   ALT 19 06/05/2023   AST 24 06/05/2023   NA 138 01/25/2024  K 4.5 01/25/2024   CL 100 01/25/2024   CREATININE 0.70 01/25/2024   BUN 14 01/25/2024   CO2 22 01/25/2024   TSH 5.22 06/05/2023   INR 0.92 05/17/2014   HGBA1C 6.2 06/05/2023    EKG Interpretation Date/Time:  Monday March 28 2024 08:39:43 EDT Ventricular Rate:  66 PR Interval:  156 QRS Duration:  84 QT Interval:  414 QTC Calculation: 434 R Axis:   82  Text Interpretation: Normal sinus rhythm Anterolateral infarct (cited on or before 15-Mar-2007) ST & T wave abnormality, consider inferior ischemia When compared with ECG of 10-Jun-2023 15:05, Premature ventricular complexes are no longer Present Confirmed by [place], Trenace Coughlin [phone]) on 03/28/2024 8:45:28 AM   Assessment / Plan: 1. Coronary disease status post stenting of the proximal LAD in September 2014 with drug-eluting stent. 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 Cardiac cath in November 2015 showed continued stent patency. Stable class 1 symptoms.   Continue Coreg  and ASA.   2. Hyperlipidemia. History of intolerance to multiple statins, Zetia , fenofibrates, and Lopid. Excellent response to Repatha  with marked reduction in LDL to 58. Tolerating well. Follow up labs with PCP this fall.   3. Hypertension- BP now well controlled. Continue current Rx.   4. Hypothyroidism. TSH normal.   Follow up in 6 months

## 2024-03-28 ENCOUNTER — Ambulatory Visit: Attending: Cardiology | Admitting: Cardiology

## 2024-03-28 ENCOUNTER — Other Ambulatory Visit (HOSPITAL_COMMUNITY): Payer: Self-pay

## 2024-03-28 ENCOUNTER — Other Ambulatory Visit: Payer: Self-pay

## 2024-03-28 ENCOUNTER — Encounter: Payer: Self-pay | Admitting: Cardiology

## 2024-03-28 VITALS — BP 116/66 | HR 66 | Ht 63.0 in | Wt 145.0 lb

## 2024-03-28 DIAGNOSIS — I2510 Atherosclerotic heart disease of native coronary artery without angina pectoris: Secondary | ICD-10-CM

## 2024-03-28 DIAGNOSIS — E7800 Pure hypercholesterolemia, unspecified: Secondary | ICD-10-CM

## 2024-03-28 DIAGNOSIS — I10 Essential (primary) hypertension: Secondary | ICD-10-CM

## 2024-03-28 MED ORDER — LISINOPRIL 10 MG PO TABS
ORAL_TABLET | ORAL | 3 refills | Status: DC
  Filled 2024-03-28: qty 180, 90d supply, fill #0
  Filled 2024-03-28: qty 60, 30d supply, fill #0

## 2024-03-28 MED ORDER — AMLODIPINE BESYLATE 2.5 MG PO TABS
2.5000 mg | ORAL_TABLET | Freq: Every day | ORAL | 3 refills | Status: AC
  Filled 2024-03-28 (×2): qty 30, 30d supply, fill #0

## 2024-03-28 MED ORDER — CARVEDILOL 6.25 MG PO TABS
6.2500 mg | ORAL_TABLET | Freq: Two times a day (BID) | ORAL | 3 refills | Status: AC
  Filled 2024-03-28: qty 60, 30d supply, fill #0

## 2024-03-28 MED ORDER — AMLODIPINE BESYLATE 2.5 MG PO TABS: 2.5000 mg | ORAL_TABLET | Freq: Every day | ORAL | 3 refills | Status: DC

## 2024-03-28 MED ORDER — NITROGLYCERIN 0.4 MG SL SUBL
0.4000 mg | SUBLINGUAL_TABLET | SUBLINGUAL | 6 refills | Status: AC | PRN
  Filled 2024-03-28: qty 25, 5d supply, fill #0
  Filled 2024-03-28: qty 25, 30d supply, fill #0

## 2024-03-28 NOTE — Patient Instructions (Signed)

## 2024-03-28 NOTE — Addendum Note (Signed)
 Addended by: CHRISTIANNE CHANNING PARAS on: 03/28/2024 09:57 AM   Modules accepted: Orders

## 2024-03-29 ENCOUNTER — Other Ambulatory Visit: Payer: Self-pay

## 2024-03-29 ENCOUNTER — Encounter: Payer: Self-pay | Admitting: Pharmacist

## 2024-04-01 ENCOUNTER — Other Ambulatory Visit: Payer: Self-pay

## 2024-05-15 ENCOUNTER — Other Ambulatory Visit: Payer: Self-pay | Admitting: Internal Medicine

## 2024-06-02 ENCOUNTER — Other Ambulatory Visit: Payer: Self-pay | Admitting: Internal Medicine

## 2024-06-02 DIAGNOSIS — Z1231 Encounter for screening mammogram for malignant neoplasm of breast: Secondary | ICD-10-CM

## 2024-06-13 ENCOUNTER — Ambulatory Visit: Admitting: Internal Medicine

## 2024-06-13 ENCOUNTER — Encounter: Payer: Self-pay | Admitting: Internal Medicine

## 2024-06-13 VITALS — BP 144/76 | HR 67 | Ht 63.0 in | Wt 148.2 lb

## 2024-06-13 DIAGNOSIS — Z0000 Encounter for general adult medical examination without abnormal findings: Secondary | ICD-10-CM

## 2024-06-13 DIAGNOSIS — E7800 Pure hypercholesterolemia, unspecified: Secondary | ICD-10-CM | POA: Diagnosis not present

## 2024-06-13 DIAGNOSIS — Z23 Encounter for immunization: Secondary | ICD-10-CM

## 2024-06-13 DIAGNOSIS — I10 Essential (primary) hypertension: Secondary | ICD-10-CM | POA: Diagnosis not present

## 2024-06-13 DIAGNOSIS — E034 Atrophy of thyroid (acquired): Secondary | ICD-10-CM | POA: Diagnosis not present

## 2024-06-13 DIAGNOSIS — R5383 Other fatigue: Secondary | ICD-10-CM

## 2024-06-13 DIAGNOSIS — R7303 Prediabetes: Secondary | ICD-10-CM

## 2024-06-13 MED ORDER — SERTRALINE HCL 50 MG PO TABS: ORAL_TABLET | ORAL | 3 refills | Status: AC

## 2024-06-13 NOTE — Assessment & Plan Note (Signed)
 She remains at risk for diabetes . Given her age,  The use of anti hyperglycemics was decided against unless her a1c rises to 7.0  .  Continue  PCSK9 inhibitor and ACE inhibitor.  Discussed adding SGLT 2 inhibitor if UaCr is > 30 given concurrent CAD   Lab Results  Component Value Date   HGBA1C 6.2 06/05/2023   No results found for: LABMICR, MICROALBUR

## 2024-06-13 NOTE — Assessment & Plan Note (Signed)

## 2024-06-13 NOTE — Progress Notes (Unsigned)
 Patient ID: Veronica Barron, female    DOB: 1941/02/11  Age: 83 y.o. MRN: [id number]  The patient is here for annual preventive examination and management of other chronic and acute problems.   The risk factors are reflected in the social history.   The roster of all physicians providing medical care to patient - is listed in the Snapshot section of the chart.   Activities of daily living:  The patient is 100% independent in all ADLs: dressing, toileting, feeding as well as independent mobility   Home safety : The patient has smoke detectors in the home. They wear seatbelts.  There are no unsecured firearms at home. There is no violence in the home.    There is no risks for hepatitis, STDs or HIV. There is no   history of blood transfusion. They have no travel history to infectious disease endemic areas of the world.   The patient has seen their dentist in the last six month. They have seen their eye doctor in the last year. The patinet  denies slight hearing difficulty with regard to whispered voices and some television programs.  They have deferred audiologic testing in the last year.  They do not  have excessive sun exposure. Discussed the need for sun protection: hats, long sleeves and use of sunscreen if there is significant sun exposure.    Diet: the importance of a healthy diet is discussed. They do have a healthy diet.   The benefits of regular aerobic exercise were discussed. The patient  walks daily for 20 minutes for exercise and is actively engaged in sewing items for trade and charity .    Depression screen: there are no signs or vegative symptoms of depression- irritability, change in appetite, anhedonia, sadness/tearfullness.   The following portions of the patient's history were reviewed and updated as appropriate: allergies, current medications, past family history, past medical history,  past surgical history, past social history  and problem list.   Visual acuity was not  assessed per patient preference since the patient has regular follow up with an  ophthalmologist. Hearing and body mass index were assessed and reviewed.    During the course of the visit the patient was educated and counseled about appropriate screening and preventive services including : fall prevention , diabetes screening, nutrition counseling, colorectal cancer screening, and recommended immunizations.    a  Chief Complaint:  HTN:   Patient is taking her medications as prescribed and notes no adverse effects.  Home BP readings have been done about once per week and are  generally < 130/80 .  She is avoiding added salt in her diet and walking regularly about 3 times per week for exercise  . Home BP today was  126/78  on 3 drug regimen  No balance issues or falls.   Does yardwork .[name]  Walks for exercise.  Hands are arthritic due to years of sewing    Review of Symptoms  Patient denies headache, fevers, malaise, unintentional weight loss, skin rash, eye pain, sinus congestion and sinus pain, sore throat, dysphagia,  hemoptysis , cough, dyspnea, wheezing, chest pain, palpitations, orthopnea, edema, abdominal pain, nausea, melena, diarrhea, constipation, flank pain, dysuria, hematuria, urinary  Frequency, nocturia, numbness, tingling, seizures,  Focal weakness, Loss of consciousness,  Tremor, insomnia, depression, anxiety, and suicidal ideation.    Physical Exam:  BP (!) 144/76   Pulse 67   Ht 5' 3 (1.6 m)   Wt 148 lb 3.2 oz (67.2 kg)  SpO2 94%   BMI 26.25 kg/m    Physical Exam Vitals reviewed.  Constitutional:      General: She is not in acute distress.    Appearance: Normal appearance. She is normal weight. She is not ill-appearing, toxic-appearing or diaphoretic.  HENT:     Head: Normocephalic.  Eyes:     General: No scleral icterus.       Right eye: No discharge.        Left eye: No discharge.     Conjunctiva/sclera: Conjunctivae normal.  Cardiovascular:     Rate and  Rhythm: Normal rate and regular rhythm.     Heart sounds: Normal heart sounds.  Pulmonary:     Effort: Pulmonary effort is normal. No respiratory distress.     Breath sounds: Normal breath sounds.  Musculoskeletal:        General: Normal range of motion.  Skin:    General: Skin is warm and dry.  Neurological:     General: No focal deficit present.     Mental Status: She is alert and oriented to person, place, and time. Mental status is at baseline.  Psychiatric:        Mood and Affect: Mood normal.        Behavior: Behavior normal.        Thought Content: Thought content normal.        Judgment: Judgment normal.     Assessment and Plan: Encounter for preventive health examination Assessment & Plan: age appropriate education and counseling updated, referrals for preventative services and immunizations addressed, dietary and smoking counseling addressed, most recent labs reviewed.  I have personally reviewed and have noted:   1) the patient's medical and social history 2) The pt's use of alcohol, tobacco, and illicit drugs 3) The patient's current medications and supplements 4) Functional ability including ADL's, fall risk, home safety risk, hearing and visual impairment 5) Diet and physical activities 6) Evidence for depression or mood disorder 7) The patient's height, weight, and BMI have been recorded in the chart.     I have made referrals, and provided counseling and education based on review of the above    Hypertension, unspecified type Assessment & Plan: she reports compliance with  3 drug therapy with submaximal doses of amlodipine  , carvedilol , and lisinopril . She has deferred change from ACE I to ARB .  She does  monitor readings at home.   No changes today   Lab Results  Component Value Date   CREATININE 0.70 01/25/2024   Lab Results  Component Value Date   NA 138 01/25/2024   K 4.5 01/25/2024   CL 100 01/25/2024   CO2 22 01/25/2024     Orders: -      Comprehensive metabolic panel with GFR -     Microalbumin / creatinine urine ratio  Hypothyroidism due to acquired atrophy of thyroid  Assessment & Plan: Thyroid  function waslast checked  1 yr ago. Repeat level pending   Lab Results  Component Value Date   TSH 5.22 06/05/2023     Orders: -     TSH  Pure hypercholesterolemia -     Lipid panel -     LDL cholesterol, direct  Prediabetes Assessment & Plan: She remains at risk for diabetes . Given her age,  The use of anti hyperglycemics was decided against unless her a1c rises to 7.0  .  Continue  PCSK9 inhibitor and ACE inhibitor.  Discussed adding SGLT 2 inhibitor if UaCr is >  30 given concurrent CAD   Lab Results  Component Value Date   HGBA1C 6.2 06/05/2023   No results found for: LABMICR, MICROALBUR      Orders: -     Comprehensive metabolic panel with GFR -     Hemoglobin A1c  Other fatigue -     CBC with Differential/Platelet  Need for influenza vaccination -     Flu vaccine HIGH DOSE PF(Fluzone Trivalent)  Other orders -     Sertraline  HCl; TAKE 1 TABLET DAILY        (GENERIC EQUIVALENT FOR    ZOLOFT )  Dispense: 90 tablet; Refill: 3    No follow-ups on file.  Verneita LITTIE Kettering, MD

## 2024-06-14 LAB — CBC WITH DIFFERENTIAL/PLATELET
Basophils Absolute: 0.1 K/uL (ref 0.0–0.1)
Basophils Relative: 1 % (ref 0.0–3.0)
Eosinophils Absolute: 0.2 K/uL (ref 0.0–0.7)
Eosinophils Relative: 3.7 % (ref 0.0–5.0)
HCT: 41.8 % (ref 36.0–46.0)
Hemoglobin: 13.7 g/dL (ref 12.0–15.0)
Lymphocytes Relative: 28.5 % (ref 12.0–46.0)
Lymphs Abs: 1.9 K/uL (ref 0.7–4.0)
MCHC: 32.8 g/dL (ref 30.0–36.0)
MCV: 88.6 fl (ref 78.0–100.0)
Monocytes Absolute: 0.4 K/uL (ref 0.1–1.0)
Monocytes Relative: 6.5 % (ref 3.0–12.0)
Neutro Abs: 4 K/uL (ref 1.4–7.7)
Neutrophils Relative %: 60.3 % (ref 43.0–77.0)
Platelets: 298 K/uL (ref 150.0–400.0)
RBC: 4.72 Mil/uL (ref 3.87–5.11)
RDW: 13.8 % (ref 11.5–15.5)
WBC: 6.7 K/uL (ref 4.0–10.5)

## 2024-06-14 LAB — COMPREHENSIVE METABOLIC PANEL WITH GFR
ALT: 19 U/L (ref 0–35)
AST: 25 U/L (ref 0–37)
Albumin: 4.2 g/dL (ref 3.5–5.2)
Alkaline Phosphatase: 50 U/L (ref 39–117)
BUN: 19 mg/dL (ref 6–23)
CO2: 28 meq/L (ref 19–32)
Calcium: 9.1 mg/dL (ref 8.4–10.5)
Chloride: 103 meq/L (ref 96–112)
Creatinine, Ser: 0.78 mg/dL (ref 0.40–1.20)
GFR: 69.96 mL/min (ref >60.00)
Glucose, Bld: 101 mg/dL — ABNORMAL HIGH (ref 70–99)
Potassium: 4 meq/L (ref 3.5–5.1)
Sodium: 138 meq/L (ref 135–145)
Total Bilirubin: 0.4 mg/dL (ref 0.2–1.2)
Total Protein: 6.6 g/dL (ref 6.0–8.3)

## 2024-06-14 LAB — LIPID PANEL
Cholesterol: 140 mg/dL (ref 0–200)
HDL: 55 mg/dL (ref >39.00)
LDL Cholesterol: 27 mg/dL (ref 0–99)
NonHDL: 85.49
Total CHOL/HDL Ratio: 3
Triglycerides: 290 mg/dL — ABNORMAL HIGH (ref 0.0–149.0)
VLDL: 58 mg/dL — ABNORMAL HIGH (ref 0.0–40.0)

## 2024-06-14 LAB — MICROALBUMIN / CREATININE URINE RATIO
Creatinine,U: 55 mg/dL
Microalb Creat Ratio: UNDETERMINED mg/g (ref 0.0–30.0)
Microalb, Ur: <0.7 mg/dL

## 2024-06-14 LAB — HEMOGLOBIN A1C: Hgb A1c MFr Bld: 6.1 % (ref 4.6–6.5)

## 2024-06-14 LAB — LDL CHOLESTEROL, DIRECT: Direct LDL: 51 mg/dL

## 2024-06-14 LAB — TSH: TSH: 3.12 u[IU]/mL (ref 0.35–5.50)

## 2024-06-14 NOTE — Assessment & Plan Note (Signed)
 Thyroid  function waslast checked  1 yr ago. Repeat level pending   Lab Results  Component Value Date   TSH 5.22 06/05/2023

## 2024-06-14 NOTE — Assessment & Plan Note (Signed)
 she reports compliance with  3 drug therapy with submaximal doses of amlodipine  , carvedilol , and lisinopril . She has deferred change from ACE I to ARB .  She does  monitor readings at home.   No changes today   Lab Results  Component Value Date   CREATININE 0.70 01/25/2024   Lab Results  Component Value Date   NA 138 01/25/2024   K 4.5 01/25/2024   CL 100 01/25/2024   CO2 22 01/25/2024

## 2024-06-15 ENCOUNTER — Ambulatory Visit: Payer: Self-pay | Admitting: Internal Medicine

## 2024-06-22 ENCOUNTER — Other Ambulatory Visit: Payer: Self-pay | Admitting: Cardiology

## 2024-06-23 NOTE — Telephone Encounter (Signed)
 Copied from CRM #[id number]. Topic: Clinical - Lab/Test Results >> Jun 22, 2024  4:18 PM Shanda MATSU wrote: Reason for CRM: Patient called back in regards to lab results, adv patient of the following info:  Your CBC,  A1c,  cholesterol,  liver and kidney function have been reviewed and there are no significant or worrisome findings .Please continue your current medications and  plan to repeat  Non fasting labs in 6 months.  Regards, Dr. Marylynn Patient had no further questions.

## 2024-06-28 ENCOUNTER — Ambulatory Visit: Payer: Medicare Other | Admitting: *Deleted

## 2024-06-28 VITALS — BP 128/78 | Ht 63.0 in | Wt 146.0 lb

## 2024-06-28 DIAGNOSIS — Z0000 Encounter for general adult medical examination without abnormal findings: Secondary | ICD-10-CM

## 2024-06-28 NOTE — Progress Notes (Signed)
 Chief Complaint  Patient presents with   Medicare Wellness     Subjective:   Veronica Barron is a 83 y.o. female who presents for a Medicare Annual Wellness Visit.  Visit info / Clinical Intake: Medicare Wellness Visit Type:: Subsequent Annual Wellness Visit Persons participating in visit and providing information:: patient Medicare Wellness Visit Mode:: Telephone If telephone:: video declined Since this visit was completed virtually, some vitals may be partially provided or unavailable. Missing vitals are due to the limitations of the virtual format.: Documented vitals are patient reported If Telephone or Video please confirm:: I connected with patient using audio/video enable telemedicine. I verified patient identity with two identifiers, discussed telehealth limitations, and patient agreed to proceed. Patient Location:: Home Provider Location:: Office/Home Interpreter Needed?: No Pre-visit prep was completed: yes AWV questionnaire completed by patient prior to visit?: no Living arrangements:: lives with spouse/significant other Patient's Overall Health Status Rating: good Typical amount of pain: some (in feet for a long time) Does pain affect daily life?: no Are you currently prescribed opioids?: no  Dietary Habits and Nutritional Risks How many meals a day?: 3 Eats fruit and vegetables daily?: yes Most meals are obtained by: preparing own meals In the last 2 weeks, have you had any of the following?: none Diabetic:: no  Functional Status Activities of Daily Living (to include ambulation/medication): Independent Ambulation: Independent Medication Administration: Independent Home Management (perform basic housework or laundry): Independent Manage your own finances?: yes Primary transportation is: driving Concerns about vision?: no *vision screening is required for WTM* Concerns about hearing?: no  Fall Screening Falls in the past year?: 0 Number of falls in past  year: 0 Was there an injury with Fall?: 0 Fall Risk Category Calculator: 0 Patient Fall Risk Level: Low Fall Risk  Fall Risk Patient at Risk for Falls Due to: No Fall Risks Fall risk Follow up: Falls evaluation completed; Falls prevention discussed  Home and Transportation Safety: All rugs have non-skid backing?: N/A, no rugs All stairs or steps have railings?: yes Grab bars in the bathtub or shower?: (!) no Have non-skid surface in bathtub or shower?: yes Good home lighting?: yes Regular seat belt use?: yes Hospital stays in the last year:: no  Cognitive Assessment Difficulty concentrating, remembering, or making decisions? : no Will 6CIT or Mini Cog be Completed: yes What year is it?: 0 points What month is it?: 0 points Give patient an address phrase to remember (5 components): [address] [place] About what time is it?: 0 points Count backwards from 20 to 1: 0 points Say the months of the year in reverse: 0 points Repeat the address phrase from earlier: 0 points 6 CIT Score: 0 points  Advance Directives (For Healthcare) Does Patient Have a Medical Advance Directive?: Yes Does patient want to make changes to medical advance directive?: No - Patient declined Type of Advance Directive: Healthcare Power of [place]; Living will Copy of Healthcare Power of Attorney in Chart?: No - copy requested Copy of Living Will in Chart?: No - copy requested  Reviewed/Updated  Reviewed/Updated: Reviewed All (Medical, Surgical, Family, Medications, Allergies, Care Teams, Patient Goals)    Allergies (verified) Diphenhydramine hcl, Fenofibrate , Statins, and Zetia  [ezetimibe ]   Current Medications (verified) Outpatient Encounter Medications as of 06/28/2024  Medication Sig   ALFALFA PO Take 1 tablet by mouth daily.   amLODipine  (NORVASC ) 2.5 MG tablet Take 1 tablet (2.5 mg total) by mouth daily.   aspirin  EC 81 MG tablet Take 81  mg by mouth daily.   carvedilol  (COREG ) 6.25 MG  tablet Take 1 tablet (6.25 mg total) by mouth 2 (two) times daily with a meal.   Evolocumab  (REPATHA  SURECLICK) 140 MG/ML SOAJ INJECT 1 PEN EVERY 2 WEEKS   lisinopril  (ZESTRIL ) 10 MG tablet TAKE 1 TABLET TWICE A DAY   loratadine (CLARITIN) 10 MG tablet Take 10 mg by mouth daily.   multivitamin-lutein  (OCUVITE-LUTEIN ) CAPS capsule Take 1 capsule by mouth daily.   nitroGLYCERIN  (NITROSTAT ) 0.4 MG SL tablet Place 1 tablet (0.4 mg total) under the tongue every 5 (five) minutes as needed for chest pain.   sertraline  (ZOLOFT ) 50 MG tablet TAKE 1 TABLET DAILY        (GENERIC EQUIVALENT FOR    ZOLOFT )   SYNTHROID  88 MCG tablet TAKE 1 TABLET DAILY BEFORE BREAKFAST   vitamin B-12 (CYANOCOBALAMIN ) 500 MCG tablet Take 500 mcg by mouth daily.   vitamin C  (ASCORBIC ACID ) 500 MG tablet Take 500 mg by mouth daily.   No facility-administered encounter medications on file as of 06/28/2024.    History: Past Medical History:  Diagnosis Date   Allergic rhinitis, cause unspecified    CAD (coronary artery disease)    a. 03/2013 Abnl MV, EF 29%;  b. 03/2013 Cath/PCI: >M nl, LAD 99p (2.5x28 Promus Prem DES), LCX 100 after large OM1, RCA dominant, 20p, EF 30%.   Diverticulosis of colon    Hyperlipidemia    Hypertension    Ischemic cardiomyopathy    a. 03/2013 EF 30% by LV gram.;  b. Echo (12/14): Mod LVH with severe basal septal hypertrophy w/o LVOT obstr, EF 50-55%, normal wall motion, Gr 1 DD, MAC, mild MR, mild LAE, trivial eff.   Leiomyoma of uterus, unspecified    Unspecified hypothyroidism    Past Surgical History:  Procedure Laterality Date   ABDOMINAL HYSTERECTOMY     fibroid   at  age 66   CATARACT EXTRACTION W/ INTRAOCULAR LENS IMPLANT Bilateral 01/07/2021   Dr Meridee   CHOLECYSTECTOMY     LAPAROSCOPIC APPENDECTOMY N/A 04/26/2019   Procedure: APPENDECTOMY LAPAROSCOPIC;  Surgeon: Jordis Laneta FALCON, MD;  Location: [facility] ORS;  Service: General;  Laterality: N/A;   LEFT HEART CATHETERIZATION WITH  CORONARY ANGIOGRAM N/A 03/16/2013   Procedure: LEFT HEART CATHETERIZATION WITH CORONARY ANGIOGRAM;  Surgeon: [name] Jordan, MD;  Location: [facility] CATH LAB;  Service: Cardiovascular;  Laterality: N/A;   LEFT HEART CATHETERIZATION WITH CORONARY ANGIOGRAM N/A 05/23/2014   Procedure: LEFT HEART CATHETERIZATION WITH CORONARY ANGIOGRAM;  Surgeon: [name] Jordan, MD;  Location[facility] CATH LAB;  Service: Cardiovascular;  Laterality: N/A;   PERCUTANEOUS CORONARY STENT INTERVENTION (PCI-S)  03/16/2013   Procedure: PERCUTANEOUS CORONARY STENT INTERVENTION (PCI-S);  Surgeon: [name] Jordan, MD;  Location: [facility] CATH LAB;  Service: Cardiovascular;;   TUBAL LIGATION     Family History  Problem Relation Age of Onset   Dementia Mother    Hyperlipidemia Mother    Hypertension Mother    Coronary artery disease Father    Hypertension Father    Hyperlipidemia Father    Uterine cancer Sister 62   Breast cancer Maternal Aunt    Social History   Occupational History    Employer: RETIRED  Tobacco Use   Smoking status: Never   Smokeless tobacco: Never  Vaping Use   Vaping status: Never Used  Substance and Sexual Activity   Alcohol use: Yes    Alcohol/week: 1.0 standard drink of alcohol    Types: 1 Glasses  of wine per week    Comment: wine   Drug use: Never   Sexual activity: Not Currently    Partners: Male    Birth control/protection: Post-menopausal    Comment: married   Tobacco Counseling Counseling given: Not Answered  SDOH Screenings   Food Insecurity: No Food Insecurity (06/28/2024)  Housing: Low Risk (06/28/2024)  Transportation Needs: No Transportation Needs (06/28/2024)  Utilities: Not At Risk (06/28/2024)  Alcohol Screen: Low Risk (06/28/2024)  Depression (PHQ2-9): Low Risk (06/28/2024)  Financial Resource Strain: Low Risk (06/28/2024)  Physical Activity: Insufficiently Active (06/28/2024)  Social Connections: Socially Integrated (06/28/2024)  Stress: No Stress Concern Present  (06/28/2024)  Tobacco Use: Low Risk (06/28/2024)  Health Literacy: Adequate Health Literacy (06/28/2024)   See flowsheets for full screening details  Depression Screen PHQ 2 & 9 Depression Scale- Over the past 2 weeks, how often have you been bothered by any of the following problems? Little interest or pleasure in doing things: 0 Feeling down, depressed, or hopeless (PHQ Adolescent also includes...irritable): 0 PHQ-2 Total Score: 0 Trouble falling or staying asleep, or sleeping too much: 0 Feeling tired or having little energy: 0 Poor appetite or overeating (PHQ Adolescent also includes...weight loss): 0 Feeling bad about yourself - or that you are a failure or have let yourself or your family down: 0 Trouble concentrating on things, such as reading the newspaper or watching television (PHQ Adolescent also includes...like school work): 0 Moving or speaking so slowly that other people could have noticed. Or the opposite - being so fidgety or restless that you have been moving around a lot more than usual: 0 Thoughts that you would be better off dead, or of hurting yourself in some way: 0 PHQ-9 Total Score: 0 If you checked off any problems, how difficult have these problems made it for you to do your work, take care of things at home, or get along with other people?: Not difficult at all     Goals Addressed             This Visit's Progress    Patient Stated       Wants to continue to exercise and staying active             Objective:    Today's Vitals   06/28/24 1123  BP: 128/78  Weight: 146 lb (66.2 kg)  Height: 5' 3 (1.6 m)   Body mass index is 25.86 kg/m.  Hearing/Vision screen Hearing Screening - Comments:: No issues Vision Screening - Comments:: readers, Dr. Cleotilde- Ruthellen, Up to date Immunizations and Health Maintenance Health Maintenance  Topic Date Due   Mammogram  05/26/2024   COVID-19 Vaccine (3 - Pfizer risk series) 06/29/2024 (Originally  09/22/2019)   Medicare Annual Wellness (AWV)  06/28/2025   DTaP/Tdap/Td (2 - Td or Tdap) 01/17/2034   Pneumococcal Vaccine: 50+ Years  Completed   Influenza Vaccine  Completed   Bone Density Scan  Completed   Zoster Vaccines- Shingrix  Completed   Meningococcal B Vaccine  Aged Out   Hepatitis C Screening  Discontinued        Assessment/Plan:  This is a routine wellness examination for Veronica Barron.  Patient Care Team: Marylynn Verneita CROME, MD as PCP - General (Internal Medicine) Jordan, [name], MD as PCP - Cardiology (Cardiology) Kimble Agent, MD (General Surgery) Cleotilde Sewer, OD (Optometry)  I have personally reviewed and noted the following in the patients chart:   Medical and social history Use of alcohol, tobacco or  illicit drugs  Current medications and supplements including opioid prescriptions. Functional ability and status Nutritional status Physical activity Advanced directives List of other physicians Hospitalizations, surgeries, and ER visits in previous 12 months Vitals Screenings to include cognitive, depression, and falls Referrals and appointments  No orders of the defined types were placed in this encounter.  In addition, I have reviewed and discussed with patient certain preventive protocols, quality metrics, and best practice recommendations. A written personalized care plan for preventive services as well as general preventive health recommendations were provided to patient.   Angeline Fredericks, LPN   [id number]/[id number]   Return in 1 year (on 06/28/2025).  After Visit Summary: (MyChart) Due to this being a telephonic visit, the after visit summary with patients personalized plan was offered to patient via MyChart   Nurse Notes: Patient declines covid vaccine and Dexa scan.

## 2024-06-28 NOTE — Patient Instructions (Signed)
 Ms. Veronica Barron,  Thank you for taking the time for your Medicare Wellness Visit. I appreciate your continued commitment to your health goals. Please review the care plan we discussed, and feel free to reach out if I can assist you further.  Please note that Annual Wellness Visits do not include a physical exam. Some assessments may be limited, especially if the visit was conducted virtually. If needed, we may recommend an in-person follow-up with your provider.  Ongoing Care Seeing your primary care provider every 3 to 6 months helps us  monitor your health and provide consistent, personalized care.  Remember to keep your vaccines up to date.  Referrals If a referral was made during today's visit and you haven't received any updates within two weeks, please contact the referred provider directly to check on the status.  Recommended Screenings:  Health Maintenance  Topic Date Due   Breast Cancer Screening  05/26/2024   COVID-19 Vaccine (3 - Pfizer risk series) 06/29/2024*   Medicare Annual Wellness Visit  06/28/2025   DTaP/Tdap/Td vaccine (2 - Td or Tdap) 01/17/2034   Pneumococcal Vaccine for age over 54  Completed   Flu Shot  Completed   Osteoporosis screening with Bone Density Scan  Completed   Zoster (Shingles) Vaccine  Completed   Meningitis B Vaccine  Aged Out   Hepatitis C Screening  Discontinued  *Topic was postponed. The date shown is not the original due date.       06/28/2024   11:26 AM  Advanced Directives  Does Patient Have a Medical Advance Directive? Yes  Type of Estate Agent of [place];Living will  Does patient want to make changes to medical advance directive? No - Patient declined  Copy of Healthcare Power of Attorney in Chart? No - copy requested    Vision: Annual vision screenings are recommended for early detection of glaucoma, cataracts, and diabetic retinopathy. These exams can also reveal signs of chronic conditions such as diabetes and  high blood pressure.  Dental: Annual dental screenings help detect early signs of oral cancer, gum disease, and other conditions linked to overall health, including heart disease and diabetes.  Please see the attached documents for additional preventive care recommendations.

## 2024-06-29 ENCOUNTER — Inpatient Hospital Stay: Admission: RE | Admit: 2024-06-29 | Discharge: 2024-06-29 | Attending: Internal Medicine | Admitting: Internal Medicine

## 2024-06-29 DIAGNOSIS — Z1231 Encounter for screening mammogram for malignant neoplasm of breast: Secondary | ICD-10-CM

## 2024-06-30 ENCOUNTER — Other Ambulatory Visit: Payer: Self-pay

## 2024-06-30 MED ORDER — LISINOPRIL 10 MG PO TABS: 10.0000 mg | ORAL_TABLET | Freq: Two times a day (BID) | ORAL | 0 refills | Status: DC

## 2024-07-25 ENCOUNTER — Other Ambulatory Visit: Payer: Self-pay | Admitting: Cardiology

## 2024-07-26 ENCOUNTER — Other Ambulatory Visit: Payer: Self-pay | Admitting: Cardiology

## 2024-09-29 ENCOUNTER — Ambulatory Visit: Admitting: Cardiology

## 2024-12-21 ENCOUNTER — Other Ambulatory Visit

## 2025-06-14 ENCOUNTER — Encounter: Admitting: Internal Medicine

## 2025-07-05 ENCOUNTER — Ambulatory Visit
